# Patient Record
Sex: Female | Born: 1935 | Race: White | Hispanic: No
Health system: Southern US, Community
[De-identification: ages and names within clinical notes are randomized; demographics above are authoritative.]

## PROBLEM LIST (undated history)

## (undated) DIAGNOSIS — N200 Calculus of kidney: Secondary | ICD-10-CM

## (undated) DIAGNOSIS — I10 Essential (primary) hypertension: Secondary | ICD-10-CM

## (undated) DIAGNOSIS — M47812 Spondylosis without myelopathy or radiculopathy, cervical region: Secondary | ICD-10-CM

## (undated) DIAGNOSIS — I69320 Aphasia following cerebral infarction: Secondary | ICD-10-CM

## (undated) DIAGNOSIS — Z9289 Personal history of other medical treatment: Secondary | ICD-10-CM

## (undated) DIAGNOSIS — K5792 Diverticulitis of intestine, part unspecified, without perforation or abscess without bleeding: Secondary | ICD-10-CM

## (undated) DIAGNOSIS — M8580 Other specified disorders of bone density and structure, unspecified site: Secondary | ICD-10-CM

## (undated) DIAGNOSIS — M1990 Unspecified osteoarthritis, unspecified site: Secondary | ICD-10-CM

## (undated) DIAGNOSIS — I480 Paroxysmal atrial fibrillation: Secondary | ICD-10-CM

## (undated) DIAGNOSIS — M7989 Other specified soft tissue disorders: Secondary | ICD-10-CM

## (undated) DIAGNOSIS — N189 Chronic kidney disease, unspecified: Secondary | ICD-10-CM

## (undated) DIAGNOSIS — R269 Unspecified abnormalities of gait and mobility: Secondary | ICD-10-CM

## (undated) DIAGNOSIS — G5600 Carpal tunnel syndrome, unspecified upper limb: Secondary | ICD-10-CM

## (undated) DIAGNOSIS — M069 Rheumatoid arthritis, unspecified: Secondary | ICD-10-CM

## (undated) DIAGNOSIS — I38 Endocarditis, valve unspecified: Secondary | ICD-10-CM

## (undated) DIAGNOSIS — R918 Other nonspecific abnormal finding of lung field: Secondary | ICD-10-CM

## (undated) DIAGNOSIS — I639 Cerebral infarction, unspecified: Secondary | ICD-10-CM

## (undated) DIAGNOSIS — E559 Vitamin D deficiency, unspecified: Secondary | ICD-10-CM

## (undated) DIAGNOSIS — F419 Anxiety disorder, unspecified: Secondary | ICD-10-CM

## (undated) HISTORY — DX: Spondylosis without myelopathy or radiculopathy, cervical region: M47.812

## (undated) HISTORY — DX: Diverticulitis of intestine, part unspecified, without perforation or abscess without bleeding: K57.92

## (undated) HISTORY — DX: Chronic kidney disease, unspecified: N18.9

## (undated) HISTORY — DX: Unspecified abnormalities of gait and mobility: R26.9

## (undated) HISTORY — DX: Paroxysmal atrial fibrillation: I48.0

## (undated) HISTORY — DX: Calculus of kidney: N20.0

## (undated) HISTORY — DX: Unspecified osteoarthritis, unspecified site: M19.90

## (undated) HISTORY — DX: Aphasia following cerebral infarction: I69.320

## (undated) HISTORY — DX: Cerebral infarction, unspecified: I63.9

## (undated) HISTORY — DX: Personal history of other medical treatment: Z92.89

## (undated) HISTORY — DX: Carpal tunnel syndrome, unspecified upper limb: G56.00

## (undated) HISTORY — DX: Vitamin D deficiency, unspecified: E55.9

## (undated) HISTORY — DX: Other specified disorders of bone density and structure, unspecified site: M85.80

## (undated) HISTORY — DX: Essential (primary) hypertension: I10

## (undated) HISTORY — DX: Other nonspecific abnormal finding of lung field: R91.8

## (undated) HISTORY — PX: APPENDECTOMY: SHX54

## (undated) HISTORY — DX: Anxiety disorder, unspecified: F41.9

## (undated) HISTORY — DX: Endocarditis, valve unspecified: I38

## (undated) HISTORY — DX: Rheumatoid arthritis, unspecified: M06.9

---

## 1973-05-03 HISTORY — PX: ABDOMINAL HYSTERECTOMY: SHX81

## 1983-05-04 HISTORY — PX: NOSE SURGERY: SHX723

## 1985-05-03 HISTORY — PX: OTHER SURGICAL HISTORY: SHX169

## 1997-09-18 ENCOUNTER — Other Ambulatory Visit: Admission: RE | Admit: 1997-09-18 | Discharge: 1997-09-18 | Payer: Self-pay | Admitting: *Deleted

## 2000-01-15 ENCOUNTER — Other Ambulatory Visit: Admission: RE | Admit: 2000-01-15 | Discharge: 2000-01-15 | Payer: Self-pay | Admitting: *Deleted

## 2000-02-01 ENCOUNTER — Encounter: Admission: RE | Admit: 2000-02-01 | Discharge: 2000-02-01 | Payer: Self-pay | Admitting: Surgery

## 2000-02-01 ENCOUNTER — Encounter: Payer: Self-pay | Admitting: Surgery

## 2000-08-01 ENCOUNTER — Encounter: Payer: Self-pay | Admitting: Internal Medicine

## 2000-08-01 ENCOUNTER — Encounter: Admission: RE | Admit: 2000-08-01 | Discharge: 2000-08-01 | Payer: Self-pay | Admitting: Internal Medicine

## 2002-01-09 ENCOUNTER — Encounter: Payer: Self-pay | Admitting: Orthopedic Surgery

## 2002-01-10 ENCOUNTER — Inpatient Hospital Stay (HOSPITAL_COMMUNITY): Admission: RE | Admit: 2002-01-10 | Discharge: 2002-01-15 | Payer: Self-pay | Admitting: Orthopedic Surgery

## 2002-05-03 HISTORY — PX: REPLACEMENT TOTAL KNEE: SUR1224

## 2005-05-04 ENCOUNTER — Emergency Department (HOSPITAL_COMMUNITY): Admission: EM | Admit: 2005-05-04 | Discharge: 2005-05-04 | Payer: Self-pay | Admitting: Family Medicine

## 2005-07-01 ENCOUNTER — Ambulatory Visit: Payer: Self-pay | Admitting: Internal Medicine

## 2005-07-19 ENCOUNTER — Ambulatory Visit: Payer: Self-pay | Admitting: Internal Medicine

## 2009-01-30 ENCOUNTER — Encounter: Admission: RE | Admit: 2009-01-30 | Discharge: 2009-01-30 | Payer: Self-pay | Admitting: Internal Medicine

## 2009-03-23 ENCOUNTER — Inpatient Hospital Stay (HOSPITAL_COMMUNITY): Admission: EM | Admit: 2009-03-23 | Discharge: 2009-03-26 | Payer: Self-pay | Admitting: Emergency Medicine

## 2009-03-24 ENCOUNTER — Encounter (INDEPENDENT_AMBULATORY_CARE_PROVIDER_SITE_OTHER): Payer: Self-pay | Admitting: Neurology

## 2009-03-24 ENCOUNTER — Ambulatory Visit: Payer: Self-pay | Admitting: Vascular Surgery

## 2009-03-25 ENCOUNTER — Encounter (INDEPENDENT_AMBULATORY_CARE_PROVIDER_SITE_OTHER): Payer: Self-pay | Admitting: Neurology

## 2009-03-28 DIAGNOSIS — K649 Unspecified hemorrhoids: Secondary | ICD-10-CM | POA: Insufficient documentation

## 2009-03-28 DIAGNOSIS — K5730 Diverticulosis of large intestine without perforation or abscess without bleeding: Secondary | ICD-10-CM | POA: Insufficient documentation

## 2009-03-28 DIAGNOSIS — I10 Essential (primary) hypertension: Secondary | ICD-10-CM | POA: Insufficient documentation

## 2009-03-28 DIAGNOSIS — I4891 Unspecified atrial fibrillation: Secondary | ICD-10-CM

## 2009-03-28 DIAGNOSIS — Z87898 Personal history of other specified conditions: Secondary | ICD-10-CM | POA: Insufficient documentation

## 2009-03-28 DIAGNOSIS — M129 Arthropathy, unspecified: Secondary | ICD-10-CM | POA: Insufficient documentation

## 2009-04-02 DIAGNOSIS — I639 Cerebral infarction, unspecified: Secondary | ICD-10-CM

## 2009-04-02 HISTORY — DX: Cerebral infarction, unspecified: I63.9

## 2009-04-04 ENCOUNTER — Encounter: Admission: RE | Admit: 2009-04-04 | Discharge: 2009-05-02 | Payer: Self-pay | Admitting: Nurse Practitioner

## 2009-04-13 ENCOUNTER — Inpatient Hospital Stay (HOSPITAL_COMMUNITY): Admission: EM | Admit: 2009-04-13 | Discharge: 2009-04-14 | Payer: Self-pay | Admitting: Emergency Medicine

## 2009-05-07 ENCOUNTER — Encounter: Admission: RE | Admit: 2009-05-07 | Discharge: 2009-06-27 | Payer: Self-pay | Admitting: Nurse Practitioner

## 2009-06-03 DIAGNOSIS — Z9289 Personal history of other medical treatment: Secondary | ICD-10-CM

## 2009-06-03 HISTORY — DX: Personal history of other medical treatment: Z92.89

## 2009-06-25 ENCOUNTER — Encounter: Payer: Self-pay | Admitting: Internal Medicine

## 2009-08-13 ENCOUNTER — Ambulatory Visit: Payer: Self-pay | Admitting: Internal Medicine

## 2009-08-13 DIAGNOSIS — R059 Cough, unspecified: Secondary | ICD-10-CM | POA: Insufficient documentation

## 2009-08-13 DIAGNOSIS — R05 Cough: Secondary | ICD-10-CM

## 2009-08-14 ENCOUNTER — Encounter: Payer: Self-pay | Admitting: Internal Medicine

## 2009-08-14 LAB — CONVERTED CEMR LAB
Pro B Natriuretic peptide (BNP): 286 pg/mL — ABNORMAL HIGH (ref 0.0–100.0)
Sed Rate: 18 mm/hr (ref 0–22)

## 2009-09-04 ENCOUNTER — Ambulatory Visit: Payer: Self-pay | Admitting: Internal Medicine

## 2009-09-05 ENCOUNTER — Telehealth: Payer: Self-pay | Admitting: Internal Medicine

## 2009-09-05 ENCOUNTER — Ambulatory Visit: Payer: Self-pay | Admitting: Internal Medicine

## 2009-09-24 ENCOUNTER — Encounter: Admission: RE | Admit: 2009-09-24 | Discharge: 2009-09-24 | Payer: Self-pay | Admitting: Internal Medicine

## 2010-01-12 ENCOUNTER — Encounter: Admission: RE | Admit: 2010-01-12 | Discharge: 2010-01-12 | Payer: Self-pay | Admitting: Internal Medicine

## 2010-01-14 ENCOUNTER — Encounter: Admission: RE | Admit: 2010-01-14 | Discharge: 2010-01-14 | Payer: Self-pay | Admitting: Internal Medicine

## 2010-03-03 ENCOUNTER — Encounter: Payer: Self-pay | Admitting: Internal Medicine

## 2010-06-04 NOTE — Assessment & Plan Note (Signed)
Summary: Pulmonary/ eval cough - change to bystolic/avarpo    Visit Type:  Initial Consult Copy to:  Dr. Deland Pretty Primary Provider/Referring Provider:  Dr. Deland Pretty  CC:  Cough.  History of Present Illness: 32 yowf never smoker with cough since around 2005   August 13, 2009 cc cough x 6 years  present daily worse when lie down but does not wake her up but notices as soon as gets up to go the bathroom maybe a tsp or up to tbsp white. seems worse with cold weather assoc with nasal congestion. no response to prednisone.  no purulent sputum. Pt denies any significant sore throat, dysphagia, itching, sneezing,  nasal congestion or excess secretions,  fever, chills, sweats, unintended wt loss, pleuritic or exertional cp, hempoptysis, change in activity tolerance  orthopnea pnd or leg swelling Pt also denies any obvious fluctuation in symptoms with weather or environmental change or other alleviating or aggravating factors.       Current Medications (verified): 1)  Warfarin Sodium 2 Mg Tabs (Warfarin Sodium) .Marland Kitchen.. 1 Once Daily 2)  Atenolol 50 Mg Tabs (Atenolol) .Marland Kitchen.. 1 1/2 Once Daily 3)  Diltiazem Hcl Cr 180 Mg Xr24h-Cap (Diltiazem Hcl) .Marland Kitchen.. 1 Once Daily 4)  Amiodarone Hcl 200 Mg Tabs (Amiodarone Hcl) .Marland Kitchen.. 1 Once Daily 5)  Losartan Potassium 100 Mg Tabs (Losartan Potassium) .Marland Kitchen.. 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  1. Nasal  surgery for a deviated septum in 1985.  2. Intestinal surgery in 1983.  3. Hysterectomy in 1975.  4. Appendectomy in 1940. Knee Arthroplasty-Total.. Dione Plover. Aluisio, M.D...01/10/2002  Family History:  Positive for hypertension and emphysema and negative   for stroke.     TB- Father Lymphoma- Mother  Social History: The patient is married and lives with her spouse.  She   has a caretaker for her spouse as well (he has dementia).  There is no  Never smoker No ETOH  Review of Systems       The patient complains of productive cough, irregular heartbeats, headaches, nasal congestion/difficulty breathing through nose, sneezing, hand/feet swelling, and joint stiffness or pain.  The patient denies shortness of breath with activity, shortness of breath at rest, non-productive cough, coughing up blood, chest pain, acid heartburn, indigestion, loss of appetite, weight change, abdominal pain, difficulty swallowing, sore throat, tooth/dental problems, itching, ear ache, anxiety, depression, rash, change in color of mucus, and fever.    Vital Signs:  Patient profile:   77 year old female Height:      60 inches Weight:      122 pounds O2 Sat:      94 % on Room air Temp:     98.4 degrees F oral Pulse rate:   77 / minute BP sitting:   140 / 74  (left arm)  Vitals Entered By: Tilden Dome (August 13, 2009 1:57 PM)  O2 Flow:  Room air  Physical Exam  Additional Exam:  thin amb wf who failed to answer a single question asked  in a straightforward manner, tending to go off on tangents or answer questions with ambiguous medical terms or diagnoses and seemed perplexed  when asked the same question more than once for clarification.  wt 122 August 13, 2009 HEENT: nl dentition, turbinates, and orophanx. Nl external ear canals without cough reflex NECK :  without JVD/Nodes/TM/ nl carotid upstrokes bilaterally LUNGS: no acc muscle use, clear to A and P bilaterally without cough on insp or exp  maneuvers CV:  RRR  no s3 or murmur or increase in P2, no edema  ABD:  soft and nontender with nl excursion in the supine position. No bruits or organomegaly, bowel sounds nl MS:  warm without deformities, calf tenderness, cyanosis or clubbing SKIN: warm and dry without lesions   NEURO:  alert, approp, no deficits     Sed Rate                  18 mm/hr                    0-22  Tests: (2) B-Type Natiuretic Peptide (BNPR)  B-Type Natriuetic Peptide                        [H]  286.0 pg/mL                 0.0-100.0  CXR  Procedure date:  06/25/2009  Findings:      decreased chronic appearing changes in Right apex with no active dz  Impression & Recommendations:  Problem # 1:  COUGH (ICD-786.2) The most common causes of chronic cough in immunocompetent adults include: upper airway cough syndrome (UACS), previously referred to as postnasal drip syndrome,  caused by variety of rhinosinus conditions; (2) asthma; (3) GERD; (4) chronic bronchitis from cigarette smoking or other inhaled environmental irritants; (5) nonasthmatic eosinophilic bronchitis; and (6) bronchiectasis. These conditions, singly or in combination, have accounted for up to 94% of the causes of chronic cough in prospective studies.   most likely this is Upper airway cough syndrome, so named because it's frequently impossible to sort out how much is L CR/sinusitis with freq throat clearing  (vs primary GERD)  generating secondary extra esophageal GERD from wide swings in gastric pressure that occur with throat clearing, promoting self use of mint and menthol lozenges that reduce the lower esophageal sphincter tone and exacerbate the problem further.  These symptoms are easily confused with asthma/copd by even experienced pulmonogists because they overlap so much. These are the same pts who not infrequently have failed to tolerate ace inhibitors,  dry powder inhalers or biphosphonates or report having reflux symptoms that don't  respond to standard doses of PPI  For now max rx gerd rx and change cozaar to avapro based purely on anecdotal reports of cough from this particular arb.  Could be cough variant asthma so try off atenonol and on  bystolic, the most beta -1  selective Beta blocker available in sample form, with bisoprolol the most selective generic choice  on the market.   Problem # 2:  HYPERTENSION (ICD-401.9)  The following medications were removed from the medication list:    Amlodipine Besylate 10 Mg Tabs (Amlodipine besylate) .Marland Kitchen... 1 tab once daily    Atenolol 50 Mg Tabs (Atenolol) .Marland Kitchen... 1 1/2 once daily    Benicar 40 Mg Tabs (Olmesartan medoxomil) .Marland Kitchen... 1 tab once daily    Ziac 5-6.25 Mg Tabs (Bisoprolol-hydrochlorothiazide) .Marland Kitchen... 1 tab once daily  Losartan Potassium 100 Mg Tabs (Losartan potassium) .Marland Kitchen... 1 once daily Her updated medication list for this problem includes:    Diltiazem Hcl Cr 180 Mg Xr24h-cap (Diltiazem hcl) .Marland Kitchen... 1 once daily    Bystolic 5 Mg Tabs (Nebivolol hcl) ..... One tablet daily    Avapro 300 Mg Tabs (Irbesartan) ..... One tablet by mouth daily  Medications Added to Medication List This Visit: 1)  Warfarin Sodium 2 Mg Tabs (Warfarin sodium) .Marland Kitchen.. 1 once daily 2)  Atenolol 50 Mg Tabs (Atenolol) .Marland Kitchen.. 1 1/2 once daily 3)  Diltiazem Hcl Cr 180 Mg Xr24h-cap (Diltiazem hcl) .Marland Kitchen.. 1 once daily 4)  Amiodarone Hcl 200 Mg Tabs (Amiodarone hcl) .Marland Kitchen.. 1 once daily 5)  Losartan Potassium 100 Mg Tabs (Losartan potassium) .Marland Kitchen.. 1 once daily 6)  Bystolic 5 Mg Tabs (Nebivolol hcl) .... One tablet daily 7)  Avapro 300 Mg Tabs (Irbesartan) .... One tablet by mouth daily  Other Orders: T-Allergy Profile [place], [place], [place], [place], [place] [id number]) New Patient Level V [phone]) TLB-Sedimentation Rate (ESR) (85652-ESR) TLB-BNP (B-Natriuretic Peptide) (83880-BNPR)  Patient Instructions: 1)  Stop atenolol and start bystolic 5 mg daily 2)  Stop cozaar and start avapro 300 mg daily 3)  Start Prilosec otc  20mg  Take  one 30-60 min before first meal of the day and add pepcid 20 mg one at bedtime 4)  Try deslym otc  for cough 5)  GERD (REFLUX)  is a common cause of respiratory symptoms. It commonly presents without heartburn and can be treated with medication, but also with lifestyle changes including avoidance of late meals, excessive alcohol, smoking cessation, and avoid fatty foods, chocolate, peppermint, colas, red wine, and acidic juices such as orange juice. NO MINT OR MENTHOL PRODUCTS SO NO COUGH DROPS  6)  USE SUGARLESS CANDY INSTEAD (jolley ranchers)  7)  NO OIL BASED VITAMINS  8)  Please schedule a follow-up appointment in 2 weeks, sooner if needed and bring all active medications with you

## 2010-06-04 NOTE — Assessment & Plan Note (Signed)
Summary: Pulmonary/ ext summary f/u ov try 1st gen H1   Copy to:  Dr. Deland Pretty Primary Provider/Referring Provider:  Dr. Deland Pretty  CC:  2 wk followup.  Pt states that her cough is about 50 % improved.  She denies any new complaints today.Marland Kitchen  History of Present Illness: 70 yowf never smoker with cough since around 2005   August 13, 2009 cc cough x 6 years  present daily worse when lie down but does not wake her up but notices as soon as gets up to go the bathroom maybe a tsp or up to tbsp white. seems worse with cold weather assoc with nasal congestion. no response to prednisone.  no purulent sputum. Stop atenolol and start bystolic 5 mg daily Stop cozaar and start avapro 300 mg daily Start Prilosec otc 20mg  Take  one 30-60 min before first meal of the day and add pepcid 20 mg one at bedtime Try deslym otc  for cough Check allergy profile > completely neg  Sep 04, 2009 cc cough is about 50 % improved.  She denies any new complaints today. Main issue is drainage sensation down throat on exp to different non-specific environmental changes. Pt denies any significant sore throat, dysphagia, itching, sneezing,  nasal congestion or excess or purulent secretions,  fever, chills, sweats, unintended wt loss, pleuritic or exertional cp, hempoptysis, change in activity tolerance  orthopnea pnd or leg swelling. Pt also denies any obvious fluctuation in symptoms with weather or environmental change or other alleviating or aggravating factors.       Current Medications (verified): 1)  Warfarin Sodium 2 Mg Tabs (Warfarin Sodium) .Marland Kitchen.. 1 Once Daily 2)  Diltiazem Hcl Cr 180 Mg Xr24h-Cap (Diltiazem Hcl) .Marland Kitchen.. 1 Once Daily 3)  Amiodarone Hcl 200 Mg Tabs (Amiodarone Hcl) .Marland Kitchen.. 1 Once Daily 4)  Bystolic 5 Mg  Tabs (Nebivolol Hcl) .... One Tablet Daily 5)  Avapro 300 Mg  Tabs (Irbesartan) .... One Tablet By Mouth Daily 6)  Multivitamins  Tabs (Multiple Vitamin) .Marland Kitchen.. 1 Once Daily 7)  Calcium-Vitamin D  600-200 Mg-Unit Tabs (Calcium-Vitamin D) .Marland Kitchen.. 1 Two Times A Day 8)  Osteo Bi-Flex Adv Joint Shield  Tabs (Misc Natural Products) .Marland Kitchen.. 1 Once Daily  Allergies (verified): No Known Drug Allergies  Past History:  Past Medical History: CEREBRAL INFARCTION/ SECONDARY TO ATRIAL FIBRILLATION (ICD-434.91) ATRIAL FIBRILLATION (ICD-427.31) HYPERTENSION (ICD-401.9) ARTHRITIS (ICD-716.90) MIGRAINES, HX OF (ICD-V13.8) DIVERTICULAR DISEASE (ICD-562.10) HEMORRHOIDS (ICD-455.6) Cough onset around 2005    - Sinus CT 07/01/2005 no active sinus dz, small retention cyst left max sinus    - Chest CT 01/30/09  Linear scarring rul,  4 mm noncalcified nodule RLL     - Allergy profile neg 08/14/2009  Vital Signs:  Patient profile:   77 year old female Weight:      118 pounds BMI:     23.13 O2 Sat:      96 % on Room air Temp:     97.9 degrees F oral Pulse rate:   72 / minute BP sitting:   124 / 80  (left arm)  Vitals Entered By: Tilden Dome (Sep 04, 2009 10:58 AM)  O2 Flow:  Room air  Physical Exam  Additional Exam:  thin amb wf who failed to answer a single question asked in a straightforward manner, tending to go off on tangents or answer questions with ambiguous medical terms or diagnoses and seemed perplexed  when asked the same question more than once for clarification.  wt 122 August 13, 2009> 118 Sep 04, 2009  HEENT: nl dentition, turbinates, and orophanx. Nl external ear canals without cough reflex NECK :  without JVD/Nodes/TM/ nl carotid upstrokes bilaterally LUNGS: no acc muscle use, clear to A and P bilaterally without cough on insp or exp maneuvers CV:  RRR  no s3 or murmur or increase in P2, no edema  ABD:  soft and nontender with nl excursion in the supine position. No bruits or organomegaly, bowel sounds nl MS:  warm without deformities, calf tenderness, cyanosis or clubbing    Impression & Recommendations:  Problem # 1:  COUGH (ICD-786.2) Classic  Classic Upper airway cough  syndrome, so named because it's frequently impossible to sort out how much is  CR/sinusitis with freq throat clearing (which can be related to primary GERD)   vs  causing  secondary extra esophageal GERD from wide swings in gastric pressure that occur with throat clearing, promoting self use of mint and menthol lozenges that reduce the lower esophageal sphincter tone and exacerbate the problem further These are the same pts who not infrequently have failed to tolerate ace inhibitors,  dry powder inhalers or biphosphonates or report having reflux symptoms that don't respond to standard doses of PPI  For now add 1st gen antihistamines and r/o sinusitis with sinus ct - no evidence whatsoever this is an allergy, though she seemed unshakable on this issue and has already seen an allergist who apparently didn't feel it was allergic either  The standardized cough guidelines recently published in Chest are a 14 step process, not a single office visit,  and are intended  to address this problem logically,  with an alogrithm dependent on response to each progressive step  to determine a specific diagnosis with  minimal addtional testing needed. Therefore if compliance is an issue this empiric standardized approach simply won't work.   Problem # 2:  HYPERTENSION (ICD-401.9)  Her updated medication list for this problem includes:    Diltiazem Hcl Cr 180 Mg Xr24h-cap (Diltiazem hcl) .Marland Kitchen... 1 once daily    Bystolic 5 Mg Tabs (Nebivolol hcl) ..... One tablet daily    Avapro 300 Mg Tabs (Irbesartan) ..... One tablet by mouth daily  She's not convinced this made any difference in cough so ok to rechallenge  Each maintenance medication was reviewed in detail including most importantly the difference between maintenance prns and under what circumstances the prns are to be used.  In addition, these two groups (for which the patient should keep up with refills) were distinguished from a third group :  meds that are used  only short term with the intent to complete a course of therapy and then not refill them.  The med list was then fully reconciled and reorganized to reflect this important distinction.   Medications Added to Medication List This Visit: 1)  Multivitamins Tabs (Multiple vitamin) .Marland Kitchen.. 1 once daily 2)  Calcium-vitamin D 600-200 Mg-unit Tabs (Calcium-vitamin d) .Marland Kitchen.. 1 two times a day 3)  Osteo Bi-flex Adv Joint Shield Tabs (Misc natural products) .Marland Kitchen.. 1 once daily 4)  Chlor-trimeton 4 Mg Tabs (Chlorpheniramine maleate) .... One every 6 hours for drainage as needed  Other Orders: Misc. Referral (Misc. Ref) Est. Patient Level IV [id number])  Patient Instructions: 1)  Continue Prilosec 20 mg Take  one 30-60 min before first meal of the day and pepcid 20 mg at bedtime 2)  ok to resume your previous blood pressure pills so see if cough gets  worse. 3)  if cough is worse it's ok to take delsym up 2 tsp twice years 4)  if drainage I recommend chortrimeton 4 mg one every 6 hours 5)  GERD (REFLUX)  is a common cause of respiratory symptoms. It commonly presents without heartburn and can be treated with medication, but also with lifestyle changes including avoidance of late meals, excessive alcohol, smoking cessation, and avoid fatty foods, chocolate, peppermint, colas, red wine, and acidic juices such as orange juice. NO MINT OR MENTHOL PRODUCTS SO NO COUGH DROPS  6)  USE SUGARLESS CANDY INSTEAD (jolley ranchers)  7)  NO OIL BASED VITAMINS  8)  discussed with pt:  Unlike when you get a prescription for eyeglasses, it's not possible to always walk out of this or any medical office with a perfect prescription that is immediately effective  based on any test that we offer here.  On the contrary, it may take several weeks for the full impact of changes recommened today - hopefully you will respond well.  If not, then we'll adjust your medication on your next visit accordingly, knowing more then than we can possibly  know now.

## 2010-06-04 NOTE — Letter (Signed)
Summary: [facility]  [facility]   Imported By: Phillis Knack 08/26/2009 11:39:58  _____________________________________________________________________  External Attachment:    Type:   Image     Comment:   External Document

## 2010-06-04 NOTE — Progress Notes (Signed)
Summary: abnormal finding- call rose at radiology asap  Phone Note From Other Clinic   Caller: rose from [place]- radiology Call For: Tedford Berg nurse Summary of Call: please call rose re: abnormal finding on CT that a dr wants to bring to dr werts attn. rose says it's ok for triage to call her back at x [phone] (i couldn't get nurse on phone at the moment)  Initial call taken by: Cooper Render, CNA,  Sep 05, 2009 3:51 PM  Follow-up for Phone Call        spoke with rose, she faxed CT report, looks like Dr. Melvyn Novas has already appened CT report with results. will give ct report to [place]. McVille Bing CMA  Sep 05, 2009 4:00 PM

## 2010-06-04 NOTE — Letter (Signed)
Summary: [facility]   Imported By: Bubba Hales 03/18/2010 09:45:12  _____________________________________________________________________  External Attachment:    Type:   Image     Comment:   External Document

## 2010-08-04 LAB — BASIC METABOLIC PANEL
Calcium: 8.7 mg/dL (ref 8.4–10.5)
Calcium: 9.4 mg/dL (ref 8.4–10.5)
Chloride: 102 mEq/L (ref 96–112)
Creatinine, Ser: 0.99 mg/dL (ref 0.4–1.2)
GFR calc Af Amer: >60 mL/min (ref >60)
GFR calc Af Amer: >60 mL/min (ref >60)
GFR calc non Af Amer: 55 mL/min — ABNORMAL LOW (ref >60)
Sodium: 135 mEq/L (ref 135–145)
Sodium: 138 mEq/L (ref 135–145)

## 2010-08-04 LAB — URINALYSIS, ROUTINE W REFLEX MICROSCOPIC
Glucose, UA: NEGATIVE mg/dL
Protein, ur: 100 mg/dL — AB
Protein, ur: NEGATIVE mg/dL
Specific Gravity, Urine: 1.02 (ref 1.005–1.030)
Urobilinogen, UA: 0.2 mg/dL (ref 0.0–1.0)

## 2010-08-04 LAB — CBC
HCT: 42.3 % (ref 36.0–46.0)
Hemoglobin: 12.7 g/dL (ref 12.0–15.0)
Hemoglobin: 13.6 g/dL (ref 12.0–15.0)
Hemoglobin: 14.2 g/dL (ref 12.0–15.0)
MCV: 96.3 fL (ref 78.0–100.0)
Platelets: 206 10*3/uL (ref 150–400)
RBC: 3.82 MIL/uL — ABNORMAL LOW (ref 3.87–5.11)
RBC: 4.11 MIL/uL (ref 3.87–5.11)
RBC: 4.4 MIL/uL (ref 3.87–5.11)
RDW: 13.4 % (ref 11.5–15.5)
WBC: 7.4 10*3/uL (ref 4.0–10.5)
WBC: 8.8 10*3/uL (ref 4.0–10.5)

## 2010-08-04 LAB — URINE MICROSCOPIC-ADD ON

## 2010-08-04 LAB — URINE CULTURE

## 2010-08-04 LAB — PROTIME-INR
INR: 2.67 — ABNORMAL HIGH (ref 0.00–1.49)
INR: 4.52 — ABNORMAL HIGH (ref 0.00–1.49)
INR: >10 (ref 0.00–1.49)
Prothrombin Time: 28.2 seconds — ABNORMAL HIGH (ref 11.6–15.2)
Prothrombin Time: 78.7 seconds — ABNORMAL HIGH (ref 11.6–15.2)
Prothrombin Time: 80.2 seconds — ABNORMAL HIGH (ref 11.6–15.2)

## 2010-08-04 LAB — APTT: aPTT: 85 seconds — ABNORMAL HIGH (ref 24–37)

## 2010-08-05 LAB — URINALYSIS, ROUTINE W REFLEX MICROSCOPIC
Bilirubin Urine: NEGATIVE
Nitrite: NEGATIVE
Specific Gravity, Urine: 1.014 (ref 1.005–1.030)
pH: 6 (ref 5.0–8.0)

## 2010-08-05 LAB — CBC
HCT: 38.4 % (ref 36.0–46.0)
MCHC: 34.9 g/dL (ref 30.0–36.0)
MCV: 96.5 fL (ref 78.0–100.0)
Platelets: 207 10*3/uL (ref 150–400)
RDW: 14.3 % (ref 11.5–15.5)

## 2010-08-05 LAB — LIPID PANEL
Cholesterol: 158 mg/dL (ref 0–200)
HDL: 60 mg/dL (ref >39)
LDL Cholesterol: 86 mg/dL (ref 0–99)
Total CHOL/HDL Ratio: 2.6 RATIO

## 2010-08-05 LAB — COMPREHENSIVE METABOLIC PANEL
Albumin: 4.4 g/dL (ref 3.5–5.2)
BUN: 41 mg/dL — ABNORMAL HIGH (ref 6–23)
Calcium: 9.6 mg/dL (ref 8.4–10.5)
Chloride: 109 mEq/L (ref 96–112)
Creatinine, Ser: 1.44 mg/dL — ABNORMAL HIGH (ref 0.4–1.2)
Total Bilirubin: 1.1 mg/dL (ref 0.3–1.2)

## 2010-08-05 LAB — DIFFERENTIAL
Basophils Absolute: 0 10*3/uL (ref 0.0–0.1)
Lymphocytes Relative: 22 % (ref 12–46)
Lymphs Abs: 1.6 10*3/uL (ref 0.7–4.0)
Monocytes Absolute: 0.5 10*3/uL (ref 0.1–1.0)
Neutro Abs: 4.8 10*3/uL (ref 1.7–7.7)

## 2010-08-05 LAB — GLUCOSE, CAPILLARY
Glucose-Capillary: 113 mg/dL — ABNORMAL HIGH (ref 70–99)
Glucose-Capillary: 92 mg/dL (ref 70–99)
Glucose-Capillary: 92 mg/dL (ref 70–99)

## 2010-08-05 LAB — CARDIAC PANEL(CRET KIN+CKTOT+MB+TROPI)
Relative Index: INVALID (ref 0.0–2.5)
Total CK: 99 U/L (ref 7–177)

## 2010-09-18 NOTE — H&P (Signed)
NAMEJERSIE, TOSTO                        ACCOUNT NO.:  [id number]   MEDICAL RECORD NO.:  [id number]                   PATIENT TYPE:   LOCATION:                                       FACILITY:   PHYSICIAN:  [id number]                                DATE OF BIRTH:   DATE OF ADMISSION:  01/10/2002  DATE OF DISCHARGE:                                HISTORY & PHYSICAL   CHIEF COMPLAINT:  Right knee pain.   HISTORY OF PRESENT ILLNESS:  The patient is a 77 year old female who was  seen for evaluation by Dr. Gaynelle Arabian for ongoing right knee pain.  She  has been treated conservatively for her knee pain in the past utilizing  medications, and also has undergone a series of Synvisc injections back in  May, which she did not get much relief from.  The patient was seen in  consultation for ongoing knee pain that has started to curtail her  activities, as well as her daily living.  She has reached a stage where she  feels like she would like to proceed with total knee replacement.  Risks and  benefits have been discussed, and she has elected to proceed with surgery.   ALLERGIES:  No known drug allergies.   CURRENT MEDICATIONS:  1. Diclofenac 50 mg p.o. b.i.d., stopped a few days prior to surgery.  2. Ziac 5/6.25 mg one p.o. q.a.m.  3. Micardis 80/12.5 mg one p.o. q.a.m.   PAST MEDICAL HISTORY:  1. History of migraines.  2. Hypertension.  3. Diverticulosis.  4. Hemorrhoids.  5. History of ankle fracture.  6. Osteoarthritis.   PAST SURGICAL HISTORY:  1. Nasal surgery for a deviated septum in 1985.  2. Intestinal surgery in 1983.  3. Hysterectomy in 1975.  4. Appendectomy in 1940.   SOCIAL HISTORY:  She is married.  Denies the use of tobacco products or  alcohol products.  Her husband will be assisting her with care following  surgery.  She has four children.  Lives in a single story home with no  stairs.   FAMILY HISTORY:  Mother deceased at age 49 with a history of  lymphoma.  Father died at age 52 of a history of TB.   REVIEW OF SYMPTOMS:  GENERAL:  No fever, chills, night sweats.  NEUROLOGIC:  No seizures, syncope, or paralysis.  She does have a history of migraines.  RESPIRATORY:  No shortness of breath, productive cough, or hemoptysis.  CARDIOVASCULAR:  No chest pain, angina, or orthopnea.  GASTROINTESTINAL:  She does have diverticulosis and hemorrhoids, no nausea, vomiting, diarrhea,  or constipation.  No blood or mucus in the stool at this time.  GENITOURINARY:  She does have some urinary frequency.  No dysuria,  hematuria, or discharge.  MUSCULOSKELETAL:  Pertinent to the right knee  found in the  history of present illness.   PHYSICAL EXAMINATION:  VITAL SIGNS:  Pulse 64, respirations 16, blood  pressure 138/88.  GENERAL:  The patient is a 77 year old female, well-developed, well-  nourished, appears to be in no acute distress.  She is alert, oriented, and  cooperative.  Very pleasant.  HEENT:  Normocephalic, atraumatic.  Pupils are round and reactive.  Oropharynx clear.  NECK:  Supple.  No carotid bruits are appreciated.  CHEST:  Clear to auscultation anterior and posterior chest walls, no  rhonchi, rales, or other adventitious sounds appreciated.  HEART:  Regular rate and rhythm.  She has a grade 2 to 3/6 systolic ejection  murmur, S1 and S2 noted.  ABDOMEN:  Soft, nontender, bowel sounds are present.  RECTAL:  Not done, not pertinent to present illness.  BREASTS:  Not done, not pertinent to present illness.  GENITALIA:  Not done, not pertinent to present illness.  EXTREMITIES:  To the right lower extremity:  She is noted to have a  significant valgus deformity of approximately 20 degrees while standing.  Range of motion is 5 to 105 degrees.  There is no instability about the  joint.   IMPRESSION:  1. Osteoarthritis, right knee.  2. Hypertension.  3. History of migraines.  4. Diverticulosis.  5. Hemorrhoids.   PLAN:  The patient  is being admitted to [facility] to undergo a  right total knee replacement arthroplasty.  Risks and benefits of the  procedure have been discussed with the patient, and she has elected to  proceed with surgery.  The patient's physician is Dr. Jeanmarie Hubert.  He will  be notified of the room number on admission, and will be consulted if needed  for any medical assistance with this patient throughout the hospital course.      Alexzandrew L. [place], [place]    ALP/MEDQ  D:  01/10/2002  T:  01/11/2002  Job:  [id number]

## 2010-09-18 NOTE — Op Note (Signed)
Madison Berg, Madison Berg                     ACCOUNT NO.:  [id number]   MEDICAL RECORD NO.:  [id number]                   PATIENT TYPE:  INP   LOCATION:  [place]                                 FACILITY:  [facility]   PHYSICIAN:  Dione Plover. Aluisio, M.D.              DATE OF BIRTH:  1924/03/07   DATE OF PROCEDURE:  01/10/2002  DATE OF DISCHARGE:                                 OPERATIVE REPORT   PREOPERATIVE DIAGNOSIS:  Osteophytes, right knee.   POSTOPERATIVE DIAGNOSIS:  Osteophytes, right knee.   PROCEDURE:  Right total knee arthroplasty.   SURGEON:  Dione Plover. Aluisio, M.D.   ASSISTANT:  Alexzandrew L. Dara Lords, PA   ANESTHESIA:  General.   ESTIMATED BLOOD LOSS:  Minimal.   DRAINS:  Hemovac x1.   COMPLICATIONS:  None.   TOURNIQUET TIME:  48 min at 350 mmHg.   DISPOSITION:  Patient stable to recovery.   BRIEF CLINICAL NOTE:  The patient is a 77 year old female with severe  osteophytes of the right knee, with significant valgus deformity; with pain  refractory to nonoperative management.  She is now for right total knee  arthroplasty.   PROCEDURE IN DETAIL:  After successful administration of general anesthesia,  the tourniquet was placed around the right thigh and the right upper  extremity prepped and draped in the usual sterile fashion.  The tourniquet  was wrapped in Esmarch, the knee flexed and the tourniquet deflated to 350  mmHg.  A standard midline incision was made with the 10 blade through  subcutaneous tissue, to the level of the extensor mechanisms.  Given her  significant valgus deformity, leaving the lateral parapatellar arthrotomy.  Soft tissue over the proximal and lateral tibia subperiosteal elevator to  the joint line with the knife and peeled off the proximal lateral tibia.  The soft tissue of the proximal and medial tibia is left intact.  We then  immediately everted her patella and flexed the knee 90 degrees.  The patella  tendon insertion was completely  normal.  The ACL and PCL were then removed.  Drills were used for creating a starting hole in the distal femur and the  canal irrigated.  A 5-degree right valgus alignment guide is placed.  The  block is then pinned, removing 9 mm off the distal femur.  Distal femur  resection is made with an oscillating saw.  Sizing block is placed and a  size 3 is most appropriate.  We utilized the epicondylar axis for rotation.  We did not use the posterior condyles because that would have internally  rotated the prosthesis plus the posterolateral deficiency.  Using the  epicondylar axis we then pinned the AP block and entered the anterior and  posterior cuts.   The tibia was subluxed forward of the menisci removed.  The extramedullary  tibial alignment guide was placed, representing proximal length of the  medial aspect of the tibial tubercle and distally along  the second  metatarsal axis of the tibial crest.  The block is then pinned to remove 10  mm off the nondeficient side.  The tibia resection is made with an  oscillating saw.   The size 3 tibial component is most effective in covering the cut bone  surface.  We then placed a size 3 mogul bearing tibial component there, and  prepared the proximal tibia with a modular drill and keel punch.  The  chamfer and intercondylar blocks were placed on the distal femur, and those  cuts made.  A size 3 posterior stabilized femoral trial; size 3 mobile  bearing tibial trial, with a 10 mm posterior stabilizer rotating platform  insert trial were placed.  Full extension is achieved, with excellent varus  and valgus balance throughout -- down past 120 degrees of flexion.  The  valgus deformity was corrected back to a more normal anatomic axis.  The  patella was then everted medially, taking the spacer to be  22 mm freehand resection, taken at 12 mm.  A 38 template placed.  Lug hole  was drilled.  Trial patella placed and it tracks normally.  The osteophytes   were then removed off the posterior femur, with the femoral trial in place.  All trials were then removed, and the coupling surfaces repaired with  pulsatile lavage.  Cement was mixed and once ready for implantation, the  size 3 posterior stabilized femur, size 3 mobile bearing tibial tray and 38  patella are cemented into place and patella held with a clamp.  A 10 mm  trial insert was placed; the knee held in full extension, all extruding  cement removed.  Once the cement had fully hardened and the permanent 10 mm  posterior stabilizer routine platform insert was placed into the tibial  tray.  The knee is reduced and there is excellent stability throughout.  The  wound was copiously irrigated with antibiotic solution and the tourniquet  released -- total time of 48 min.  Minor bleeding stopped with cautery.  Arthrotomy was closed with a Hemovac drain, leaving open a lateral release  from the superior to inferior hole of the patella.  Flexion is established  at 140 degrees, with completely normal tracking.  Subcutaneous was closed  with interrupted 2-0 Vicryl, subcuticular running 4-0 Monocryl.  Incision  was clean and dry.  Steri-Strips and a bulky sterile dressing were applied.  The patient was then awakened and transported to the recovery in stable  condition.                                               Dione Plover Aluisio, M.D.    FVA/MEDQ  D:  01/10/2002  T:  01/10/2002  Job:  [id number]

## 2010-09-18 NOTE — Discharge Summary (Signed)
Madison Berg, Madison Berg NO.:  [id number]   MEDICAL RECORD NO.:  [id number]                   PATIENT TYPE:   LOCATION:                                       FACILITY:   PHYSICIAN:  Gaynelle Arabian, MD                   DATE OF BIRTH:   DATE OF ADMISSION:  01/10/2002  DATE OF DISCHARGE:  01/15/2002                                 DISCHARGE SUMMARY   ADMISSION DIAGNOSIS:  1. Osteoarthritis right knee.  2. Hypertension.  3. History of migraines.  4. Diverticulosis.  5. Hemorrhoids.   DISCHARGE DIAGNOSIS:  1. Osteoarthritis right knee, status post right total knee replacement     arthroplasty.  2. Postoperative mild blood loss anemia.  3. Postoperative hypokalemia, improved.  4. Postoperative hyponatremia, improved.  5. Hypertension.  6. History of migraines.  7. Diverticulosis.  8. Hemorrhoids.   PROCEDURE:  The patient was taken to the OR on January 10, 2002 and  underwent a right total knee replacement arthroplasty.   SURGEON:  Gaynelle Arabian, M.D.   ASSISTANT:  Mickel Crow, P.A.C.   ANESTHESIA:  General.   DRAINS:  Hemovac times one.   TOURNIQUET TIME:  48 minutes at 350 mmHg.   CONSULTATIONS:  None.   HISTORY OF PRESENT ILLNESS:  The patient is a 77 year old female evaluated  by Madison Berg for ongoing knee pain. She has been treated conservatively in  the past about utilizing medications. She has also undergone a series of  injections back in May which did not give any relief. Knee pain has been  ongoing for some time now. She felt that she would benefit from undergoing  total knee replacement. Risks and benefits discussed and she is subsequently  admitted to [facility]:  CBC on admission revealed hemoglobin and hematocrit 13.0  and 37.6, white cell count 6 and red blood cells count 4.07. Postoperative  hemoglobin and hematocrit 10.6 and 30.0. Follow-up hemoglobin and hematocrit  9.9 and 28.6. Last noted hemoglobin and hematocrit 9.9 and 28.3.  Differential on the admission CBC all within normal limits. PT and PTT on  admission were 13.1 and 29 respectively with INR of 0.9. Serial Protimes  followed Coumadin protocol. Last noted PT and INR 23.2 and 2.4 respectively.  Chemistry on admission revealed mildly elevated BUN of 27. Remaining  chemistries were all within normal limits. Postoperative B-met  revealed  sodium dropped to 129. Fluids were adjusted and sodium was back up to 139.  Potassium dropped postoperative down from 4.6 to 3.2. Placed on potassium  supplements and back up to 3.8. BUN came down from 27 to a normal level of  20. UA on admission negative. Blood type O+.   DIAGNOSTIC STUDIES:  Preoperative  chest x-ray dated January 09, 2002  revealed probable scarring in apices, chronic obstructive pulmonary disease.  EKG dated January 10, 2002 revealed normal  sinus rhythm, incomplete bundle  branch block. This is an unconfirmed EKG.   HOSPITAL COURSE:  The patient was admitted to [facility],  taken to the OR and underwent the above said procedure without  complications. The patient tolerated the procedure well and was taken to the  recovery room and to the orthopedic flood on continued postoperative care.  Vital signs were followed. The patient was placed on PCA analgesics for pain  control following surgery. Given 24 hours of postoperative IV antibiotics.  The patient was placed on weight bearing as tolerated. Home med's were  restarted. She was placed on Coumadin for three weeks for deep vein  thrombosis prophylaxis. Electrolytes were followed. The patient did have a  drop in sodium postoperative. Fluids were adjusted. Sodium came back up.  Unfortunately, potassium dropped after surgery and she was placed on  potassium supplements. Potassium responded very well. PT and OT were  consulted to assist with gait training, ambulation, and  activities of daily  living. The patient progressed well with PT, initially ambulating 100 feet  by postoperative day two and even up to  200 feet by postoperative day four.  Dressing change initiated on postoperative day two and incision was healing  well. Weaned off PCA analgesics and switched over to P.O. medications by  January 15, 2002. She was going quite well, taking med's by mouth.  Ambulating well with PT. Meeting goals and was discharged to home with  discharge medications.   DISPOSITION:  Discharged to home on January 15, 2002.   DISCHARGE MEDICATIONS:  Coumadin, Percocet, and Robaxin.   DIET:  Low sodium diet.   FOLLOW UP:  Next week after discharge, call the office for an appointment.   ACTIVITY:  Weight bearing as tolerated. Home health PT and home health  nursing per [facility].   CONDITION ON DISCHARGE:  Improved.     Alexzandrew L. Dara Lords, P.A.              Gaynelle Arabian, MD    ALP/MEDQ  D:  02/13/2002  T:  02/13/2002  Job:  [id number]   cc:   Gaynelle Arabian, MD  [address]  [place]  [place] [address]  Fax: [phone]

## 2011-01-25 ENCOUNTER — Other Ambulatory Visit: Payer: Self-pay | Admitting: Internal Medicine

## 2011-01-25 DIAGNOSIS — Z1231 Encounter for screening mammogram for malignant neoplasm of breast: Secondary | ICD-10-CM

## 2011-02-10 ENCOUNTER — Ambulatory Visit
Admission: RE | Admit: 2011-02-10 | Discharge: 2011-02-10 | Disposition: A | Discharge status: Home / Self Care | Payer: 59 | Source: Ambulatory Visit | Attending: Internal Medicine | Admitting: Internal Medicine

## 2011-02-10 DIAGNOSIS — Z1231 Encounter for screening mammogram for malignant neoplasm of breast: Secondary | ICD-10-CM

## 2011-03-31 ENCOUNTER — Other Ambulatory Visit: Payer: Self-pay | Admitting: Internal Medicine

## 2011-03-31 ENCOUNTER — Ambulatory Visit
Admission: RE | Admit: 2011-03-31 | Discharge: 2011-03-31 | Disposition: A | Discharge status: Home / Self Care | Payer: 59 | Source: Ambulatory Visit | Attending: Internal Medicine | Admitting: Internal Medicine

## 2011-03-31 DIAGNOSIS — S0990XA Unspecified injury of head, initial encounter: Secondary | ICD-10-CM

## 2011-05-04 HISTORY — PX: CATARACT EXTRACTION: SUR2

## 2012-02-18 ENCOUNTER — Other Ambulatory Visit: Payer: Self-pay | Admitting: Internal Medicine

## 2012-02-18 DIAGNOSIS — Z1231 Encounter for screening mammogram for malignant neoplasm of breast: Secondary | ICD-10-CM

## 2012-03-24 ENCOUNTER — Ambulatory Visit
Admission: RE | Admit: 2012-03-24 | Discharge: 2012-03-24 | Disposition: A | Discharge status: Home / Self Care | Payer: Medicare Other | Source: Ambulatory Visit | Attending: Internal Medicine | Admitting: Internal Medicine

## 2012-03-24 DIAGNOSIS — Z1231 Encounter for screening mammogram for malignant neoplasm of breast: Secondary | ICD-10-CM

## 2012-08-19 ENCOUNTER — Encounter: Payer: Self-pay | Admitting: Pharmacist Clinician (PhC)/ Clinical Pharmacy Specialist

## 2012-08-19 DIAGNOSIS — Z7901 Long term (current) use of anticoagulants: Secondary | ICD-10-CM | POA: Insufficient documentation

## 2012-08-19 DIAGNOSIS — I4891 Unspecified atrial fibrillation: Secondary | ICD-10-CM

## 2012-08-29 ENCOUNTER — Other Ambulatory Visit (HOSPITAL_COMMUNITY): Payer: Self-pay | Admitting: Cardiovascular Disease

## 2012-08-29 DIAGNOSIS — I639 Cerebral infarction, unspecified: Secondary | ICD-10-CM

## 2012-09-06 ENCOUNTER — Ambulatory Visit (HOSPITAL_COMMUNITY): Payer: Medicare Other

## 2012-09-19 ENCOUNTER — Ambulatory Visit (HOSPITAL_COMMUNITY)
Admission: RE | Admit: 2012-09-19 | Discharge: 2012-09-19 | Disposition: A | Discharge status: Home / Self Care | Payer: Medicare Other | Source: Ambulatory Visit | Attending: Cardiovascular Disease | Admitting: Cardiovascular Disease

## 2012-09-19 DIAGNOSIS — I4891 Unspecified atrial fibrillation: Secondary | ICD-10-CM | POA: Insufficient documentation

## 2012-09-19 DIAGNOSIS — I639 Cerebral infarction, unspecified: Secondary | ICD-10-CM

## 2012-09-19 DIAGNOSIS — I359 Nonrheumatic aortic valve disorder, unspecified: Secondary | ICD-10-CM | POA: Insufficient documentation

## 2012-09-19 DIAGNOSIS — I6350 Cerebral infarction due to unspecified occlusion or stenosis of unspecified cerebral artery: Secondary | ICD-10-CM | POA: Insufficient documentation

## 2012-09-19 DIAGNOSIS — I059 Rheumatic mitral valve disease, unspecified: Secondary | ICD-10-CM | POA: Insufficient documentation

## 2012-09-19 DIAGNOSIS — I079 Rheumatic tricuspid valve disease, unspecified: Secondary | ICD-10-CM | POA: Insufficient documentation

## 2012-09-19 DIAGNOSIS — I10 Essential (primary) hypertension: Secondary | ICD-10-CM | POA: Insufficient documentation

## 2012-09-19 HISTORY — PX: TRANSTHORACIC ECHOCARDIOGRAM: SHX275

## 2012-09-19 NOTE — Progress Notes (Signed)
[facility]   2D echo completed 09/19/2012.   Jamison Neighbor, RDCS

## 2012-09-27 ENCOUNTER — Ambulatory Visit (INDEPENDENT_AMBULATORY_CARE_PROVIDER_SITE_OTHER): Payer: 59 | Admitting: Pharmacist Clinician (PhC)/ Clinical Pharmacy Specialist

## 2012-09-27 VITALS — BP 156/88 | HR 80

## 2012-09-27 DIAGNOSIS — Z7901 Long term (current) use of anticoagulants: Secondary | ICD-10-CM

## 2012-09-27 DIAGNOSIS — I4891 Unspecified atrial fibrillation: Secondary | ICD-10-CM

## 2012-09-27 LAB — POCT INR: INR: 2.8

## 2012-11-08 ENCOUNTER — Ambulatory Visit (INDEPENDENT_AMBULATORY_CARE_PROVIDER_SITE_OTHER): Payer: Medicare Other | Admitting: Pharmacist Clinician (PhC)/ Clinical Pharmacy Specialist

## 2012-11-08 VITALS — BP 150/60 | HR 72

## 2012-11-08 DIAGNOSIS — I4891 Unspecified atrial fibrillation: Secondary | ICD-10-CM

## 2012-11-08 DIAGNOSIS — Z7901 Long term (current) use of anticoagulants: Secondary | ICD-10-CM

## 2012-11-08 LAB — POCT INR: INR: 1.8

## 2012-11-14 ENCOUNTER — Ambulatory Visit (INDEPENDENT_AMBULATORY_CARE_PROVIDER_SITE_OTHER): Payer: Medicare Other | Admitting: Sports Medicine

## 2012-11-14 VITALS — BP 152/68 | Ht 60.0 in | Wt 110.0 lb

## 2012-11-14 DIAGNOSIS — M2141 Flat foot [pes planus] (acquired), right foot: Secondary | ICD-10-CM | POA: Insufficient documentation

## 2012-11-14 DIAGNOSIS — M2140 Flat foot [pes planus] (acquired), unspecified foot: Secondary | ICD-10-CM

## 2012-11-14 DIAGNOSIS — M129 Arthropathy, unspecified: Secondary | ICD-10-CM

## 2012-11-14 DIAGNOSIS — M2170 Unequal limb length (acquired), unspecified site: Secondary | ICD-10-CM

## 2012-11-14 NOTE — Assessment & Plan Note (Addendum)
Likely as a consequence of her leg length discrepancy and possibly the fracture. Insole with right scaphoid pad given with improvement in discomfort and gait. May consider custom orthotics if the insoles help a lot. She can continue in sports insoles if they do enough to relieve her pain Follow up in 1 month or as needed.

## 2012-11-14 NOTE — Progress Notes (Signed)
Patient ID: Madison Berg, female   DOB: 1923-10-11, 79 y.o.   MRN: [id number] Subjective: Ms. Warbington is here today for a new patient appointment for right foot pain.  She was referred by Dr. Shelia Media and his PA, Baldemar Friday.  She reports that the medial aspect of her right foot causes her significant pain.  She states that she has arthritis in her feet, but the right posterior arch has a bony prominence that makes her shoes uncomfortable.  She has had some pain for several years, but has noticed an increase over the last month or so.  She had an x-ray that showed degenerative changes in 10/2012.    She also fractured the right ankle about 10 years ago.  PMHx: stroke in 2011 (residual balance and speech affects) PSHx: none FHx: 2 sons with HTN SHx: non smoker, no alcohol; retired  Allergies: none Meds: see list  Objective: BP 152/68  Ht 5' (1.524 m)  Wt 110 lb (49.896 kg)  BMI 21.48 kg/m2 Gen: alert, cooperative, very pleasant, NAD Right foot: no erythema, mild swelling in the ankle, holds foot externally rotated with bony prominence at the rear of the arch; minimal tenderness over posterior arch She has tarsal bossing and collapse of the navicular arch Gait: right foot with external rotation and significant pronation Leg length: right leg about 1.5cm longer than left  A/P: 79 yo woman with right foot pain secondary to arthritis, leg length discrepancy, and loss of posterior/mid arch. See problem list for specifics.

## 2012-11-14 NOTE — Assessment & Plan Note (Addendum)
Does have some mild scoliosis but not enough to account for the leg length difference 1cm blue pad in left insole to help correct this

## 2012-11-14 NOTE — Patient Instructions (Addendum)
It was nice to see you!  Wear the insoles in your shoes.  Follow up if the insoles are uncomfortable or make you unsteady.

## 2012-11-14 NOTE — Assessment & Plan Note (Signed)
Diffuse. Likely contributing to some of her foot pain.

## 2012-12-07 ENCOUNTER — Ambulatory Visit (INDEPENDENT_AMBULATORY_CARE_PROVIDER_SITE_OTHER): Payer: Medicare Other | Admitting: Pharmacist Clinician (PhC)/ Clinical Pharmacy Specialist

## 2012-12-07 DIAGNOSIS — I4891 Unspecified atrial fibrillation: Secondary | ICD-10-CM

## 2012-12-07 DIAGNOSIS — Z7901 Long term (current) use of anticoagulants: Secondary | ICD-10-CM

## 2012-12-18 ENCOUNTER — Ambulatory Visit: Payer: Medicare Other | Admitting: Pharmacist Clinician (PhC)/ Clinical Pharmacy Specialist

## 2012-12-21 ENCOUNTER — Ambulatory Visit: Payer: Medicare Other | Admitting: Pharmacist Clinician (PhC)/ Clinical Pharmacy Specialist

## 2012-12-28 ENCOUNTER — Ambulatory Visit (INDEPENDENT_AMBULATORY_CARE_PROVIDER_SITE_OTHER): Payer: Medicare Other | Admitting: Pharmacist Clinician (PhC)/ Clinical Pharmacy Specialist

## 2012-12-28 VITALS — BP 116/60 | HR 64

## 2012-12-28 DIAGNOSIS — Z7901 Long term (current) use of anticoagulants: Secondary | ICD-10-CM

## 2012-12-28 DIAGNOSIS — I4891 Unspecified atrial fibrillation: Secondary | ICD-10-CM

## 2013-01-25 ENCOUNTER — Ambulatory Visit (INDEPENDENT_AMBULATORY_CARE_PROVIDER_SITE_OTHER): Payer: Medicare Other | Admitting: Pharmacist Clinician (PhC)/ Clinical Pharmacy Specialist

## 2013-01-25 VITALS — BP 136/54 | HR 72

## 2013-01-25 DIAGNOSIS — Z7901 Long term (current) use of anticoagulants: Secondary | ICD-10-CM

## 2013-01-25 DIAGNOSIS — I4891 Unspecified atrial fibrillation: Secondary | ICD-10-CM

## 2013-01-30 ENCOUNTER — Encounter: Payer: Self-pay | Admitting: *Deleted

## 2013-01-31 ENCOUNTER — Encounter: Payer: Self-pay | Admitting: Internal Medicine

## 2013-01-31 ENCOUNTER — Ambulatory Visit (INDEPENDENT_AMBULATORY_CARE_PROVIDER_SITE_OTHER): Payer: Medicare Other | Admitting: Internal Medicine

## 2013-01-31 VITALS — BP 148/66 | HR 60 | Ht 60.0 in | Wt 116.4 lb

## 2013-01-31 DIAGNOSIS — I359 Nonrheumatic aortic valve disorder, unspecified: Secondary | ICD-10-CM

## 2013-01-31 DIAGNOSIS — I639 Cerebral infarction, unspecified: Secondary | ICD-10-CM | POA: Insufficient documentation

## 2013-01-31 DIAGNOSIS — I4891 Unspecified atrial fibrillation: Secondary | ICD-10-CM

## 2013-01-31 DIAGNOSIS — I6350 Cerebral infarction due to unspecified occlusion or stenosis of unspecified cerebral artery: Secondary | ICD-10-CM

## 2013-01-31 DIAGNOSIS — I10 Essential (primary) hypertension: Secondary | ICD-10-CM

## 2013-01-31 DIAGNOSIS — I351 Nonrheumatic aortic (valve) insufficiency: Secondary | ICD-10-CM | POA: Insufficient documentation

## 2013-01-31 NOTE — Progress Notes (Addendum)
OFFICE NOTE  Chief Complaint:  Routine followup, former Madison Berg patient  Primary Care Physician: Madison Pel, MD  HPI:  Madison Berg is a pleasant 79 year old female followed for years by Dr. Rollene Berg with a history of paroxysmal atrial fibrillation. She had a stroke in [id number] with an embolic event to her left middle cerebral artery and posterior cerebral artery. She recovered fairly well except she has some mild expressive aphasia. Chest is a history of aortic insufficiency which has been moderate and was recently stable on an echocardiogram in May of 2014. Her ejection fraction is preserved and she said no ischemia. She had a Myoview stress test in February 2011 which was negative. She is on warfarin therapy and her INRs have been therapeutic and stable. She has no complaints of chest pain or shortness of breath. She maintains on amiodarone for rhythm control with her atrial fibrillation. She had recent thyroid function which was in normal limits, but I do not see recent pulmonary function testing. She will need repeat of this testing at her next office visit. Her other main complaint today is problems with bladder incontinence. She previously had seen Dr. Elder Berg and he had advised against medications for incontinence, but her problems have gotten worse and she may be a good candidate for this medication. I understand that she may have had a bladder surgery in the past.  PMHx:  Past Medical History  Diagnosis Date  . PAF (paroxysmal atrial fibrillation)   . CVA (cerebral infarction) 04/2009    LMCA & posterior cerebral - excellent recovery, mild expressive aphasa  . Hypertension   . History of nuclear stress test 06/2009    dipyridamole; negative, normal pattern of perfusion     Past Surgical History  Procedure Laterality Date  . Cataract extraction Bilateral 2013  . Replacement total knee Right 2004  . Appendectomy    . Abdominal hysterectomy  1975  .  Instestinal surgery  1987    r/t blockage  . Nose surgery  1985    r/t deviated septum  . Transthoracic echocardiogram  09/19/2012    EF 55-60% with normal systolic function; mod AR; MVP with mild-mod regurg; LA severely dilated; RV systolic pressure increased; mod dilated RA; RV systolic pressure increased - mild pulm HTN     FAMHx:  Family History  Problem Relation Age of Onset  . Lymphoma Mother   . Aneurysm Other     grandmother, uncles, cousins  . Tuberculosis Father     SOCHx:   reports that she has never smoked. She does not have any smokeless tobacco history on file. Her alcohol and drug histories are not on file.  ALLERGIES:  Allergies  Allergen Reactions  . Atenolol Other (See Comments)    Hair loss, thin nails, itching    ROS: A comprehensive review of systems was negative except for: Respiratory: positive for dyspnea on exertion  HOME MEDS: Current Outpatient Prescriptions  Medication Sig Dispense Refill  . Acetaminophen (TYLENOL PO) Take by mouth as needed.      Marland Kitchen alendronate (FOSAMAX) 70 MG tablet Take 70 mg by mouth every 7 (seven) days. Take with a full glass of water on an empty stomach.      Marland Kitchen amiodarone (PACERONE) 200 MG tablet Take 200 mg by mouth daily.      Marland Kitchen amLODipine (NORVASC) 10 MG tablet Take 10 mg by mouth daily.      . metoprolol succinate (TOPROL-XL) 50 MG 24 hr tablet Take  50 mg by mouth daily. Take with or immediately following a meal.      . warfarin (COUMADIN) 2 MG tablet Take by mouth daily. Per INR       No current facility-administered medications for this visit.    LABS/IMAGING: No results found for this or any previous visit (from the past 48 hour(s)). No results found.  VITALS: BP 148/66  Pulse 60  Ht 5' (1.524 m)  Wt 116 lb 6.4 oz (52.799 kg)  BMI 22.73 kg/m2  EXAM: General appearance: alert and no distress Neck: no adenopathy, no carotid bruit, no JVD, supple, symmetrical, trachea midline and thyroid not enlarged,  symmetric, no tenderness/mass/nodules Lungs: clear to auscultation bilaterally Heart: regular rate and rhythm, S1, S2 normal and diastolic murmur: holodiastolic 2/6, blowing at 2nd right intercostal space Abdomen: soft, non-tender; bowel sounds normal; no masses,  no organomegaly Extremities: extremities normal, atraumatic, no cyanosis or edema Pulses: 2+ and symmetric Skin: Skin color, texture, turgor normal. No rashes or lesions Neurologic: Grossly normal, mild expressive aphasia Psych: Mood, affect normal  EKG: Normal sinus rhythm at 60  ASSESSMENT: 1. Stable moderate aortic insufficiency 2. Paroxysmal atrial fibrillation on warfarin and amiodarone 3. Hypertension-controlled 4. Dizziness with falls-more stable recently, but difficulty walking  PLAN: 1.   Madison Berg is doing fairly well and is therapeutic on Coumadin. She has had some falls in the past including those with head injury and underwent scans but has not had any life-threatening intracranial bleeding. She still has problems with dizziness and some instability however has a prior stroke history and I would like to continue her warfarin until it seems that the risk outweighs the benefit. She had recent echocardiogram which demonstrated stable moderate aortic insufficiency and a preserved EF. Her blood pressure is fairly well controlled for her age., She is requesting a renewal of her handicap parking permit today. I think this is reasonable given her dizziness and slow gait and instability. It would be difficult for her to walk any great distances and she actually drives fairly infrequently.  As the pleasure to take over her care in the future. Please don't hesitate to contact me as always if you have any further questions.  Pixie Casino, MD, [facility] Attending Cardiologist [facility] 01/31/2013, 11:57 AM

## 2013-01-31 NOTE — Patient Instructions (Addendum)
Your physician wants you to follow-up in:  6 months. You will receive a reminder letter in the mail two months in advance. If you don't receive a letter, please call our office to schedule the follow-up appointment.   

## 2013-02-12 ENCOUNTER — Encounter: Payer: Self-pay | Admitting: Cardiovascular Disease

## 2013-02-14 ENCOUNTER — Telehealth: Payer: Self-pay | Admitting: Cardiovascular Disease

## 2013-02-14 NOTE — Telephone Encounter (Signed)
Patient needs a refill on her Coumadin---[facility] in [place] faxed this last week and has not heard back.  Pharmacy phone # [phone]

## 2013-02-14 NOTE — Telephone Encounter (Signed)
Message forwarded to K. Alvstad, PharmD.  

## 2013-02-15 MED ORDER — WARFARIN SODIUM 2 MG PO TABS: ORAL_TABLET | ORAL | Status: DC

## 2013-02-22 ENCOUNTER — Ambulatory Visit: Payer: Medicare Other | Admitting: Pharmacist Clinician (PhC)/ Clinical Pharmacy Specialist

## 2013-02-28 ENCOUNTER — Ambulatory Visit (INDEPENDENT_AMBULATORY_CARE_PROVIDER_SITE_OTHER): Payer: Medicare Other | Admitting: Pharmacist Clinician (PhC)/ Clinical Pharmacy Specialist

## 2013-02-28 VITALS — BP 144/56 | HR 64

## 2013-02-28 DIAGNOSIS — I4891 Unspecified atrial fibrillation: Secondary | ICD-10-CM

## 2013-02-28 DIAGNOSIS — Z7901 Long term (current) use of anticoagulants: Secondary | ICD-10-CM

## 2013-04-02 ENCOUNTER — Ambulatory Visit (INDEPENDENT_AMBULATORY_CARE_PROVIDER_SITE_OTHER): Payer: Medicare Other | Admitting: Pharmacist Clinician (PhC)/ Clinical Pharmacy Specialist

## 2013-04-02 VITALS — BP 142/60 | HR 68

## 2013-04-02 DIAGNOSIS — Z7901 Long term (current) use of anticoagulants: Secondary | ICD-10-CM

## 2013-04-02 DIAGNOSIS — I4891 Unspecified atrial fibrillation: Secondary | ICD-10-CM

## 2013-04-10 ENCOUNTER — Ambulatory Visit: Payer: Medicare Other | Admitting: Cardiovascular Disease

## 2013-04-11 ENCOUNTER — Ambulatory Visit: Payer: Medicare Other | Admitting: Cardiovascular Disease

## 2013-04-30 ENCOUNTER — Ambulatory Visit (INDEPENDENT_AMBULATORY_CARE_PROVIDER_SITE_OTHER): Payer: Medicare Other | Admitting: Pharmacist Clinician (PhC)/ Clinical Pharmacy Specialist

## 2013-04-30 VITALS — BP 140/60 | HR 68

## 2013-04-30 DIAGNOSIS — I4891 Unspecified atrial fibrillation: Secondary | ICD-10-CM

## 2013-04-30 DIAGNOSIS — Z7901 Long term (current) use of anticoagulants: Secondary | ICD-10-CM

## 2013-04-30 LAB — POCT INR: INR: 3.3

## 2013-05-04 ENCOUNTER — Other Ambulatory Visit: Payer: Self-pay | Admitting: *Deleted

## 2013-05-04 MED ORDER — AMIODARONE HCL 200 MG PO TABS: 200.0000 mg | ORAL_TABLET | Freq: Every day | ORAL | Status: DC

## 2013-05-21 ENCOUNTER — Ambulatory Visit (INDEPENDENT_AMBULATORY_CARE_PROVIDER_SITE_OTHER): Payer: Medicare Other | Admitting: Pharmacist Clinician (PhC)/ Clinical Pharmacy Specialist

## 2013-05-21 VITALS — BP 140/56 | HR 72

## 2013-05-21 DIAGNOSIS — I4891 Unspecified atrial fibrillation: Secondary | ICD-10-CM

## 2013-05-21 DIAGNOSIS — Z7901 Long term (current) use of anticoagulants: Secondary | ICD-10-CM

## 2013-05-21 LAB — POCT INR: INR: 2.1

## 2013-06-18 ENCOUNTER — Ambulatory Visit: Payer: Medicare Other | Admitting: Pharmacist Clinician (PhC)/ Clinical Pharmacy Specialist

## 2013-07-04 ENCOUNTER — Ambulatory Visit (INDEPENDENT_AMBULATORY_CARE_PROVIDER_SITE_OTHER): Payer: Medicare Other | Admitting: Pharmacist Clinician (PhC)/ Clinical Pharmacy Specialist

## 2013-07-04 VITALS — BP 140/52 | HR 76

## 2013-07-04 DIAGNOSIS — Z7901 Long term (current) use of anticoagulants: Secondary | ICD-10-CM

## 2013-07-04 DIAGNOSIS — I4891 Unspecified atrial fibrillation: Secondary | ICD-10-CM

## 2013-07-04 LAB — POCT INR: INR: 1.9

## 2013-07-10 ENCOUNTER — Ambulatory Visit (INDEPENDENT_AMBULATORY_CARE_PROVIDER_SITE_OTHER): Payer: Medicare Other | Admitting: Internal Medicine

## 2013-07-10 ENCOUNTER — Encounter: Payer: Self-pay | Admitting: Internal Medicine

## 2013-07-10 VITALS — BP 144/64 | HR 64 | Ht 61.0 in | Wt 108.0 lb

## 2013-07-10 DIAGNOSIS — I4891 Unspecified atrial fibrillation: Secondary | ICD-10-CM

## 2013-07-10 DIAGNOSIS — Z7901 Long term (current) use of anticoagulants: Secondary | ICD-10-CM

## 2013-07-10 DIAGNOSIS — I351 Nonrheumatic aortic (valve) insufficiency: Secondary | ICD-10-CM

## 2013-07-10 DIAGNOSIS — I359 Nonrheumatic aortic valve disorder, unspecified: Secondary | ICD-10-CM

## 2013-07-10 DIAGNOSIS — I639 Cerebral infarction, unspecified: Secondary | ICD-10-CM

## 2013-07-10 DIAGNOSIS — I10 Essential (primary) hypertension: Secondary | ICD-10-CM

## 2013-07-10 DIAGNOSIS — I6350 Cerebral infarction due to unspecified occlusion or stenosis of unspecified cerebral artery: Secondary | ICD-10-CM

## 2013-07-10 NOTE — Patient Instructions (Signed)
Your physician wants you to follow-up in:  6 months. You will receive a reminder letter in the mail two months in advance. If you don't receive a letter, please call our office to schedule the follow-up appointment.   

## 2013-07-10 NOTE — Progress Notes (Signed)
OFFICE NOTE  Chief Complaint:  Routine followup, former Madison Berg patient  Primary Care Physician: Horatio Pel, MD  HPI:  Madison Berg is a pleasant 80 year old female followed for years by Dr. Rollene Berg with a history of paroxysmal atrial fibrillation. She had a stroke in [id number] with an embolic event to her left middle cerebral artery and posterior cerebral artery. She recovered fairly well except she has some mild expressive aphasia. Chest is a history of aortic insufficiency which has been moderate and was recently stable on an echocardiogram in May of 2014. Her ejection fraction is preserved and she said no ischemia. She had a Myoview stress test in February 2011 which was negative. She is on warfarin therapy and her INRs have been therapeutic and stable. She has no complaints of chest pain or shortness of breath. She maintains on amiodarone for rhythm control with her atrial fibrillation. She had recent thyroid function which was in normal limits, but I do not see recent pulmonary function testing. She will need repeat of this testing at her next office visit. Her other main complaint today is problems with bladder incontinence. She previously had seen Dr. Elder Negus and he had advised against medications for incontinence, but her problems have gotten worse and she may be a good candidate for this medication. I understand that she may have had a bladder surgery in the past.  Madison Berg has no new complaints today. She denies any worsening shortness of breath or chest pain. Probably concern is that she is having increasing hair loss. She may be attributed this to beta blockers however I think this medication is important for her rate control.  PMHx:  Past Medical History  Diagnosis Date  . PAF (paroxysmal atrial fibrillation)   . CVA (cerebral infarction) 04/2009    LMCA & posterior cerebral - excellent recovery, mild expressive aphasa  . Hypertension   . History of  nuclear stress test 06/2009    dipyridamole; negative, normal pattern of perfusion     Past Surgical History  Procedure Laterality Date  . Cataract extraction Bilateral 2013  . Replacement total knee Right 2004  . Appendectomy    . Abdominal hysterectomy  1975  . Instestinal surgery  1987    r/t blockage  . Nose surgery  1985    r/t deviated septum  . Transthoracic echocardiogram  09/19/2012    EF 55-60% with normal systolic function; mod AR; MVP with mild-mod regurg; LA severely dilated; RV systolic pressure increased; mod dilated RA; RV systolic pressure increased - mild pulm HTN     FAMHx:  Family History  Problem Relation Age of Onset  . Lymphoma Mother   . Aneurysm Other     grandmother, uncles, cousins  . Tuberculosis Father     SOCHx:   reports that she has never smoked. She does not have any smokeless tobacco history on file. Her alcohol and drug histories are not on file.  ALLERGIES:  Allergies  Allergen Reactions  . Atenolol Other (See Comments)    Hair loss, thin nails, itching    ROS: A comprehensive review of systems was negative except for: Respiratory: positive for dyspnea on exertion  HOME MEDS: Current Outpatient Prescriptions  Medication Sig Dispense Refill  . Acetaminophen (TYLENOL PO) Take by mouth as needed.      Marland Kitchen alendronate (FOSAMAX) 70 MG tablet Take 70 mg by mouth every 7 (seven) days. Take with a full glass of water on an empty stomach.      Marland Kitchen  amiodarone (PACERONE) 200 MG tablet Take 1 tablet (200 mg total) by mouth daily.  30 tablet  6  . amLODipine (NORVASC) 10 MG tablet Take 10 mg by mouth daily.      . metoprolol succinate (TOPROL-XL) 50 MG 24 hr tablet Take 50 mg by mouth daily. Take with or immediately following a meal.      . warfarin (COUMADIN) 2 MG tablet Take 1 tablet by mouth daily or as directed  30 tablet  3   No current facility-administered medications for this visit.    LABS/IMAGING: No results found for this or any  previous visit (from the past 48 hour(s)). No results found.  VITALS: BP 144/64  Pulse 64  Ht 5\' 1"  (1.549 m)  Wt 108 lb (48.988 kg)  BMI 20.42 kg/m2  EXAM: General appearance: alert and no distress Neck: no adenopathy, no carotid bruit, no JVD, supple, symmetrical, trachea midline and thyroid not enlarged, symmetric, no tenderness/mass/nodules Lungs: clear to auscultation bilaterally Heart: regular rate and rhythm, S1, S2 normal and diastolic murmur: holodiastolic 2/6, blowing at 2nd right intercostal space Abdomen: soft, non-tender; bowel sounds normal; no masses,  no organomegaly Extremities: extremities normal, atraumatic, no cyanosis or edema Pulses: 2+ and symmetric Skin: Skin color, texture, turgor normal. No rashes or lesions Neurologic: Grossly normal, mild expressive aphasia Psych: Mood, affect normal  EKG: Normal sinus rhythm at 64, first degree AV block  ASSESSMENT: 1. Stable moderate aortic insufficiency 2. Paroxysmal atrial fibrillation on warfarin and amiodarone 3. Hypertension-controlled 4. Dizziness with falls-more stable recently, but difficulty walking 5. Hair loss  PLAN: 1.   Madison Berg is doing fairly well and is therapeutic on Coumadin (was 1.9 last week). She has had some falls in the past including those with head injury and underwent scans but has not had any life-threatening intracranial bleeding. She still has problems with dizziness and some instability however has a prior stroke history and I would like to continue her warfarin until it seems that the risk outweighs the benefit. She had recent echocardiogram which demonstrated stable moderate aortic insufficiency and a preserved EF. Her blood pressure is fairly well controlled for her age., She is requesting a renewal of her handicap parking permit today. I think this is reasonable given her dizziness and slow gait and instability. It would be difficult for her to walk any great distances and she  actually drives fairly infrequently. She has reported some hair loss, but I think that this is minimal in comparison to the benefit she will get from her medications. I would continue to follow her thyroid to make sure that that is not low, otherwise the beta blocker could be contributing to this. She could consider topical Rogaine.  Please don't hesitate to contact me as always if you have any further questions.  Pixie Casino, MD, [facility] Attending Cardiologist [facility] [place] [facility] 07/10/2013, 5:10 PM

## 2013-08-01 ENCOUNTER — Ambulatory Visit (INDEPENDENT_AMBULATORY_CARE_PROVIDER_SITE_OTHER): Payer: Medicare Other | Admitting: Pharmacist Clinician (PhC)/ Clinical Pharmacy Specialist

## 2013-08-01 DIAGNOSIS — I4891 Unspecified atrial fibrillation: Secondary | ICD-10-CM

## 2013-08-01 DIAGNOSIS — Z7901 Long term (current) use of anticoagulants: Secondary | ICD-10-CM

## 2013-08-01 LAB — POCT INR: INR: 2.2

## 2013-08-29 ENCOUNTER — Ambulatory Visit: Payer: Medicare Other | Admitting: Pharmacist Clinician (PhC)/ Clinical Pharmacy Specialist

## 2013-09-03 ENCOUNTER — Ambulatory Visit: Payer: Medicare Other | Admitting: Pharmacist Clinician (PhC)/ Clinical Pharmacy Specialist

## 2013-09-07 ENCOUNTER — Ambulatory Visit (INDEPENDENT_AMBULATORY_CARE_PROVIDER_SITE_OTHER): Payer: Medicare Other | Admitting: Pharmacist Clinician (PhC)/ Clinical Pharmacy Specialist

## 2013-09-07 DIAGNOSIS — I4891 Unspecified atrial fibrillation: Secondary | ICD-10-CM

## 2013-09-07 DIAGNOSIS — Z7901 Long term (current) use of anticoagulants: Secondary | ICD-10-CM

## 2013-09-07 LAB — POCT INR: INR: 2.6

## 2013-09-17 ENCOUNTER — Ambulatory Visit (INDEPENDENT_AMBULATORY_CARE_PROVIDER_SITE_OTHER): Payer: Medicare Other | Admitting: Sports Medicine

## 2013-09-17 ENCOUNTER — Encounter: Payer: Self-pay | Admitting: Sports Medicine

## 2013-09-17 VITALS — BP 152/66 | Ht 60.0 in | Wt 107.0 lb

## 2013-09-17 DIAGNOSIS — M545 Low back pain, unspecified: Secondary | ICD-10-CM

## 2013-09-17 NOTE — Progress Notes (Signed)
   Subjective:    Patient ID: Waynard Reeds, female    DOB: Jun 16, 1923, 80 y.o.   MRN: [id number]  HPI chief complaint: Low back pain  Very pleasant 80 year old female sent over at the request of Dr.Pharr for evaluation of low back pain. Patient states that she has had low back pain for years but 3 months ago began to experience acute on chronic low back pain. It is intermittent and localized mainly to the right side of her lower back. She is very active for an 80 year old. She denies any recent or remote trauma. She takes an occasional Tylenol but that has not been very helpful. She denies any radiating pain into her groin. No radiating pain into her legs. No associated numbness or tingling in her legs. No recent weight loss. Pain does not awaken her at night. No fevers or chills. She had x-rays done from an outside source which are available for review.  Past medical history reviewed Medications reviewed Allergies reviewed    Review of Systems    as above Objective:   Physical Exam Well-developed. No acute distress. Vital signs reviewed.  Lumbar spine: Limited lumbar range of motion secondary to degenerative disc disease and arthropathy. No significant pain with movement however. There is no tenderness to palpation or percussion along the thoracic or lumbar midline. There is tenderness to palpation along the right paraspinal musculature at the L3-L4 level. Mild amount of spasm as well. No focal neurological deficits of either lower extremity. There is a leg length discrepancy when the patient is sitting but resolves with lying supine.  X-rays of the lumbar spine including AP, lateral, and a spot view dated 09/03/2013 are reviewed. Films are slightly underpenetrated. Patient has multilevel degenerative disc disease and arthropathy with an underlying scoliotic curve. Degenerative spondylolisthesis at L5-S1. No evidence of compression fracture. No obvious bony lesions.       Assessment  & Plan:  Low back pain secondary to multilevel degenerative disc disease/arthropathy  Patient history of atrial fibrillation and use of Coumadin precludes [place] from placing her on NSAIDs. Given her advanced age, I think this would be poorly tolerated anyway. I discussed with the patient the possibility of trying moist heat. I also discussed the possibility of an IM cortisone injection. She would like to try the heat first. She has no red flag symptoms and she seems to be reassured that she doesn't have anything "serious". I think she can continue with activity as tolerated and she will followup with me again in 4 weeks.

## 2013-09-28 ENCOUNTER — Other Ambulatory Visit: Payer: Self-pay | Admitting: Pharmacist Clinician (PhC)/ Clinical Pharmacy Specialist

## 2013-09-28 MED ORDER — WARFARIN SODIUM 2 MG PO TABS: ORAL_TABLET | ORAL | Status: DC

## 2013-10-05 ENCOUNTER — Ambulatory Visit (INDEPENDENT_AMBULATORY_CARE_PROVIDER_SITE_OTHER): Payer: Medicare Other | Admitting: Pharmacist Clinician (PhC)/ Clinical Pharmacy Specialist

## 2013-10-05 DIAGNOSIS — Z7901 Long term (current) use of anticoagulants: Secondary | ICD-10-CM

## 2013-10-05 DIAGNOSIS — I4891 Unspecified atrial fibrillation: Secondary | ICD-10-CM

## 2013-10-05 LAB — POCT INR: INR: 2

## 2013-10-15 ENCOUNTER — Ambulatory Visit: Payer: Medicare Other | Admitting: Sports Medicine

## 2013-10-31 ENCOUNTER — Telehealth: Payer: Self-pay | Admitting: Cardiovascular Disease

## 2013-10-31 ENCOUNTER — Ambulatory Visit: Payer: Medicare Other | Admitting: Pharmacist Clinician (PhC)/ Clinical Pharmacy Specialist

## 2013-10-31 MED ORDER — METOPROLOL SUCCINATE ER 50 MG PO TB24: 50.0000 mg | ORAL_TABLET | Freq: Every day | ORAL | Status: DC

## 2013-10-31 NOTE — Telephone Encounter (Signed)
Need a refill on Metoprolol ER 50 mg #30.

## 2013-10-31 NOTE — Telephone Encounter (Signed)
Rx was sent to pharmacy electronically. 

## 2013-11-14 ENCOUNTER — Ambulatory Visit (INDEPENDENT_AMBULATORY_CARE_PROVIDER_SITE_OTHER): Payer: Medicare Other | Admitting: Pharmacist

## 2013-11-14 ENCOUNTER — Ambulatory Visit: Payer: Medicare Other | Admitting: Pharmacist Clinician (PhC)/ Clinical Pharmacy Specialist

## 2013-11-14 DIAGNOSIS — I4891 Unspecified atrial fibrillation: Secondary | ICD-10-CM

## 2013-11-14 DIAGNOSIS — Z7901 Long term (current) use of anticoagulants: Secondary | ICD-10-CM

## 2013-11-14 LAB — POCT INR: INR: 2.1

## 2013-11-27 ENCOUNTER — Other Ambulatory Visit: Payer: Self-pay | Admitting: Endocrinology

## 2013-11-27 DIAGNOSIS — R1011 Right upper quadrant pain: Secondary | ICD-10-CM

## 2013-11-28 ENCOUNTER — Other Ambulatory Visit: Payer: Self-pay | Admitting: Cardiovascular Disease

## 2013-11-29 NOTE — Telephone Encounter (Signed)
Rx was sent to pharmacy electronically. 

## 2013-11-30 ENCOUNTER — Ambulatory Visit
Admission: RE | Admit: 2013-11-30 | Discharge: 2013-11-30 | Disposition: A | Discharge status: Home / Self Care | Payer: Medicare Other | Source: Ambulatory Visit | Attending: Endocrinology | Admitting: Endocrinology

## 2013-11-30 ENCOUNTER — Other Ambulatory Visit: Payer: Self-pay | Admitting: Endocrinology

## 2013-11-30 ENCOUNTER — Other Ambulatory Visit: Payer: Medicare Other

## 2013-11-30 DIAGNOSIS — R1011 Right upper quadrant pain: Secondary | ICD-10-CM

## 2013-12-12 ENCOUNTER — Ambulatory Visit (INDEPENDENT_AMBULATORY_CARE_PROVIDER_SITE_OTHER): Payer: Medicare Other | Admitting: Pharmacist Clinician (PhC)/ Clinical Pharmacy Specialist

## 2013-12-12 DIAGNOSIS — I4891 Unspecified atrial fibrillation: Secondary | ICD-10-CM

## 2013-12-12 DIAGNOSIS — Z7901 Long term (current) use of anticoagulants: Secondary | ICD-10-CM

## 2013-12-12 LAB — POCT INR: INR: 2.7

## 2014-01-21 ENCOUNTER — Ambulatory Visit (INDEPENDENT_AMBULATORY_CARE_PROVIDER_SITE_OTHER): Payer: Medicare Other | Admitting: Internal Medicine

## 2014-01-21 ENCOUNTER — Ambulatory Visit (INDEPENDENT_AMBULATORY_CARE_PROVIDER_SITE_OTHER): Payer: Medicare Other | Admitting: Pharmacist Clinician (PhC)/ Clinical Pharmacy Specialist

## 2014-01-21 ENCOUNTER — Encounter: Payer: Self-pay | Admitting: Internal Medicine

## 2014-01-21 VITALS — BP 140/60 | HR 64 | Ht 60.0 in | Wt 113.7 lb

## 2014-01-21 DIAGNOSIS — I359 Nonrheumatic aortic valve disorder, unspecified: Secondary | ICD-10-CM

## 2014-01-21 DIAGNOSIS — I480 Paroxysmal atrial fibrillation: Secondary | ICD-10-CM

## 2014-01-21 DIAGNOSIS — Z7901 Long term (current) use of anticoagulants: Secondary | ICD-10-CM

## 2014-01-21 DIAGNOSIS — I4891 Unspecified atrial fibrillation: Secondary | ICD-10-CM

## 2014-01-21 DIAGNOSIS — I639 Cerebral infarction, unspecified: Secondary | ICD-10-CM

## 2014-01-21 DIAGNOSIS — I6350 Cerebral infarction due to unspecified occlusion or stenosis of unspecified cerebral artery: Secondary | ICD-10-CM

## 2014-01-21 DIAGNOSIS — I10 Essential (primary) hypertension: Secondary | ICD-10-CM

## 2014-01-21 DIAGNOSIS — I351 Nonrheumatic aortic (valve) insufficiency: Secondary | ICD-10-CM

## 2014-01-21 LAB — POCT INR: INR: 1.7

## 2014-01-21 NOTE — Patient Instructions (Signed)
Your physician wants you to follow-up in: 6 months with Dr. Hilty. You will receive a reminder letter in the mail two months in advance. If you don't receive a letter, please call our office to schedule the follow-up appointment.    

## 2014-01-21 NOTE — Progress Notes (Signed)
OFFICE NOTE  Chief Complaint:  Routine followup, former [name] patient  Primary Care Physician: Horatio Pel, MD  HPI:  Madison Berg is a pleasant 80 year old female followed for years by Dr. [name] with a history of paroxysmal atrial fibrillation. She had a stroke in [id number] with an embolic event to her left middle cerebral artery and posterior cerebral artery. She recovered fairly well except she has some mild expressive aphasia. Chest is a history of aortic insufficiency which has been moderate and was recently stable on an echocardiogram in May of 2014. Her ejection fraction is preserved and she said no ischemia. She had a Myoview stress test in February 2011 which was negative. She is on warfarin therapy and her INRs have been therapeutic and stable. She has no complaints of chest pain or shortness of breath. She maintains on amiodarone for rhythm control with her atrial fibrillation. She had recent thyroid function which was in normal limits, but I do not see recent pulmonary function testing. She will need repeat of this testing at her next office visit. Her other main complaint today is problems with bladder incontinence. She previously had seen Dr. Elder Negus and he had advised against medications for incontinence, but her problems have gotten worse and she may be a good candidate for this medication. I understand that she may have had a bladder surgery in the past.  [name] has no new complaints today. She denies any worsening shortness of breath or chest pain. She is again concerned about hair loss, however I do not appreciate any significant worsening of the thinning of her hair. She is asked whether it is okay for her to use biotin supplements. I do not have a problem with that. She has had a couple of more falls but is resistant to using a walker due to decreased mobility and independence. She is since stopped driving.  PMHx:  Past Medical History  Diagnosis  Date  . PAF (paroxysmal atrial fibrillation)   . CVA (cerebral infarction) 04/2009    LMCA & posterior cerebral - excellent recovery, mild expressive aphasa  . Hypertension   . History of nuclear stress test 06/2009    dipyridamole; negative, normal pattern of perfusion     Past Surgical History  Procedure Laterality Date  . Cataract extraction Bilateral 2013  . Replacement total knee Right 2004  . Appendectomy    . Abdominal hysterectomy  1975  . Instestinal surgery  1987    r/t blockage  . Nose surgery  1985    r/t deviated septum  . Transthoracic echocardiogram  09/19/2012    EF 55-60% with normal systolic function; mod AR; MVP with mild-mod regurg; LA severely dilated; RV systolic pressure increased; mod dilated RA; RV systolic pressure increased - mild pulm HTN     FAMHx:  Family History  Problem Relation Age of Onset  . Lymphoma Mother   . Aneurysm Other     grandmother, uncles, cousins  . Tuberculosis Father     SOCHx:   reports that she has never smoked. She does not have any smokeless tobacco history on file. Her alcohol and drug histories are not on file.  ALLERGIES:  Allergies  Allergen Reactions  . Atenolol Other (See Comments)    Hair loss, thin nails, itching    ROS: A comprehensive review of systems was negative except for: falls  HOME MEDS: Current Outpatient Prescriptions  Medication Sig Dispense Refill  . Acetaminophen (TYLENOL PO) Take by mouth as  needed.      Marland Kitchen alendronate (FOSAMAX) 70 MG tablet Take 70 mg by mouth every 7 (seven) days. Take with a full glass of water on an empty stomach.      Marland Kitchen amiodarone (PACERONE) 200 MG tablet TAKE 1 TABLET BY MOUTH EVERY DAY  30 tablet  7  . amLODipine (NORVASC) 10 MG tablet Take 10 mg by mouth daily.      Marland Kitchen levothyroxine (SYNTHROID) 25 MCG tablet Take 25 mcg by mouth daily before breakfast.      . metoprolol succinate (TOPROL-XL) 50 MG 24 hr tablet Take 1 tablet (50 mg total) by mouth daily. Take with or  immediately following a meal.  30 tablet  8  . warfarin (COUMADIN) 2 MG tablet Take 1 tablet by mouth daily or as directed  30 tablet  3   No current facility-administered medications for this visit.    LABS/IMAGING: Results for orders placed in visit on 01/21/14 (from the past 48 hour(s))  POCT INR     Status: None   Collection Time    01/21/14  2:34 PM      Result Value Ref Range   INR 1.7     No results found.  VITALS: BP 140/60  Pulse 64  Ht 5' (1.524 m)  Wt 113 lb 11.2 oz (51.574 kg)  BMI 22.21 kg/m2  EXAM: General appearance: alert and no distress Neck: no adenopathy, no carotid bruit, no JVD, supple, symmetrical, trachea midline and thyroid not enlarged, symmetric, no tenderness/mass/nodules Lungs: clear to auscultation bilaterally Heart: regular rate and rhythm, S1, S2 normal and diastolic murmur: holodiastolic 2/6, blowing at 2nd right intercostal space Abdomen: soft, non-tender; bowel sounds normal; no masses,  no organomegaly Extremities: extremities normal, atraumatic, no cyanosis or edema Pulses: 2+ and symmetric Skin: Skin color, texture, turgor normal. No rashes or lesions Neurologic: Grossly normal, mild expressive aphasia Psych: Mood, affect normal  EKG: Normal sinus rhythm at 64  ASSESSMENT: 1. Stable moderate aortic insufficiency 2. Paroxysmal atrial fibrillation on warfarin and amiodarone 3. Hypertension-controlled 4. Dizziness with falls-more stable recently, but difficulty walking 5. Hair loss  PLAN: 1.   [name] is doing fairly well.  INR is slightly low today at 1.7 and will require adjustment. Her AI appears stable. Her hypertension is controlled. She has had some more falls but is hesitant to using a walker. She has number of risk factors for recurrent stroke and given her prior stroke with aphasia this could be somewhat devastating. She is aware that a fall could lead to intracranial bleeding on warfarin, especially she hit her head.  Nevertheless, I still feel that it is beneficial and a stroke reduction risk of warfarin outweighs the risk of intracranial bleeding if she were to fall. We will continue her anticoagulation.  Please don't hesitate to contact me as always if you have any further questions.  Pixie Casino, MD, [facility] Attending Cardiologist [facility] [place] [facility] 01/21/2014, 3:58 PM

## 2014-02-11 ENCOUNTER — Ambulatory Visit: Payer: Medicare Other | Admitting: Pharmacist Clinician (PhC)/ Clinical Pharmacy Specialist

## 2014-02-20 ENCOUNTER — Ambulatory Visit (INDEPENDENT_AMBULATORY_CARE_PROVIDER_SITE_OTHER): Payer: Medicare Other | Admitting: Pharmacist Clinician (PhC)/ Clinical Pharmacy Specialist

## 2014-02-20 DIAGNOSIS — I4891 Unspecified atrial fibrillation: Secondary | ICD-10-CM

## 2014-02-20 DIAGNOSIS — Z7901 Long term (current) use of anticoagulants: Secondary | ICD-10-CM

## 2014-02-20 DIAGNOSIS — I480 Paroxysmal atrial fibrillation: Secondary | ICD-10-CM

## 2014-02-20 LAB — POCT INR: INR: 2.1

## 2014-03-14 ENCOUNTER — Other Ambulatory Visit: Payer: Self-pay | Admitting: Internal Medicine

## 2014-03-14 DIAGNOSIS — R278 Other lack of coordination: Secondary | ICD-10-CM

## 2014-03-14 DIAGNOSIS — Z9181 History of falling: Secondary | ICD-10-CM

## 2014-03-15 ENCOUNTER — Ambulatory Visit (HOSPITAL_COMMUNITY)
Admission: RE | Admit: 2014-03-15 | Discharge: 2014-03-15 | Disposition: A | Discharge status: Home / Self Care | Payer: Medicare Other | Source: Ambulatory Visit | Attending: Internal Medicine | Admitting: Internal Medicine

## 2014-03-15 ENCOUNTER — Other Ambulatory Visit: Payer: Self-pay

## 2014-03-15 ENCOUNTER — Encounter (HOSPITAL_COMMUNITY): Payer: Self-pay | Admitting: *Deleted

## 2014-03-15 ENCOUNTER — Ambulatory Visit (HOSPITAL_COMMUNITY): Payer: Medicare Other

## 2014-03-15 ENCOUNTER — Inpatient Hospital Stay (HOSPITAL_COMMUNITY)
Admission: EM | Admit: 2014-03-15 | Discharge: 2014-03-17 | DRG: 084 | Disposition: A | Discharge status: Home / Self Care | Payer: Medicare Other | Attending: Internal Medicine | Admitting: Internal Medicine

## 2014-03-15 ENCOUNTER — Emergency Department (HOSPITAL_COMMUNITY)
Admission: EM | Admit: 2014-03-15 | Discharge: 2014-03-15 | Discharge status: Absent without leave | Payer: Medicare Other | Source: Home / Self Care

## 2014-03-15 DIAGNOSIS — S065X9A Traumatic subdural hemorrhage with loss of consciousness of unspecified duration, initial encounter: Secondary | ICD-10-CM | POA: Diagnosis not present

## 2014-03-15 DIAGNOSIS — W19XXXA Unspecified fall, initial encounter: Secondary | ICD-10-CM | POA: Diagnosis present

## 2014-03-15 DIAGNOSIS — R278 Other lack of coordination: Secondary | ICD-10-CM

## 2014-03-15 DIAGNOSIS — N289 Disorder of kidney and ureter, unspecified: Secondary | ICD-10-CM | POA: Diagnosis present

## 2014-03-15 DIAGNOSIS — I6201 Nontraumatic acute subdural hemorrhage: Secondary | ICD-10-CM

## 2014-03-15 DIAGNOSIS — Z96651 Presence of right artificial knee joint: Secondary | ICD-10-CM | POA: Diagnosis present

## 2014-03-15 DIAGNOSIS — Z9181 History of falling: Secondary | ICD-10-CM

## 2014-03-15 DIAGNOSIS — Z8673 Personal history of transient ischemic attack (TIA), and cerebral infarction without residual deficits: Secondary | ICD-10-CM

## 2014-03-15 DIAGNOSIS — S40011A Contusion of right shoulder, initial encounter: Secondary | ICD-10-CM | POA: Diagnosis present

## 2014-03-15 DIAGNOSIS — I480 Paroxysmal atrial fibrillation: Secondary | ICD-10-CM | POA: Diagnosis present

## 2014-03-15 DIAGNOSIS — I639 Cerebral infarction, unspecified: Secondary | ICD-10-CM | POA: Diagnosis present

## 2014-03-15 DIAGNOSIS — R260 Ataxic gait: Secondary | ICD-10-CM | POA: Diagnosis present

## 2014-03-15 DIAGNOSIS — I4891 Unspecified atrial fibrillation: Secondary | ICD-10-CM | POA: Diagnosis present

## 2014-03-15 DIAGNOSIS — Z7901 Long term (current) use of anticoagulants: Secondary | ICD-10-CM

## 2014-03-15 DIAGNOSIS — Z9841 Cataract extraction status, right eye: Secondary | ICD-10-CM

## 2014-03-15 DIAGNOSIS — I10 Essential (primary) hypertension: Secondary | ICD-10-CM | POA: Diagnosis present

## 2014-03-15 DIAGNOSIS — Y929 Unspecified place or not applicable: Secondary | ICD-10-CM

## 2014-03-15 DIAGNOSIS — Z9842 Cataract extraction status, left eye: Secondary | ICD-10-CM

## 2014-03-15 DIAGNOSIS — I351 Nonrheumatic aortic (valve) insufficiency: Secondary | ICD-10-CM | POA: Diagnosis present

## 2014-03-15 DIAGNOSIS — S065XAA Traumatic subdural hemorrhage with loss of consciousness status unknown, initial encounter: Secondary | ICD-10-CM

## 2014-03-15 DIAGNOSIS — R40243 Glasgow coma scale score 3-8: Secondary | ICD-10-CM | POA: Diagnosis present

## 2014-03-15 DIAGNOSIS — Z66 Do not resuscitate: Secondary | ICD-10-CM | POA: Diagnosis present

## 2014-03-15 DIAGNOSIS — I6203 Nontraumatic chronic subdural hemorrhage: Secondary | ICD-10-CM

## 2014-03-15 LAB — URINALYSIS, ROUTINE W REFLEX MICROSCOPIC
Bilirubin Urine: NEGATIVE
GLUCOSE, UA: NEGATIVE mg/dL
Hgb urine dipstick: NEGATIVE
Ketones, ur: 15 mg/dL — AB
LEUKOCYTES UA: NEGATIVE
NITRITE: NEGATIVE
Protein, ur: NEGATIVE mg/dL
SPECIFIC GRAVITY, URINE: 1.011 (ref 1.005–1.030)
Urobilinogen, UA: 0.2 mg/dL (ref 0.0–1.0)
pH: 6.5 (ref 5.0–8.0)

## 2014-03-15 LAB — COMPREHENSIVE METABOLIC PANEL
ALBUMIN: 4.1 g/dL (ref 3.5–5.2)
ALT: 23 U/L (ref 0–35)
AST: 37 U/L (ref 0–37)
Alkaline Phosphatase: 83 U/L (ref 39–117)
Anion gap: 16 — ABNORMAL HIGH (ref 5–15)
BUN: 37 mg/dL — ABNORMAL HIGH (ref 6–23)
CALCIUM: 9.3 mg/dL (ref 8.4–10.5)
CO2: 21 meq/L (ref 19–32)
Chloride: 100 mEq/L (ref 96–112)
Creatinine, Ser: 1.58 mg/dL — ABNORMAL HIGH (ref 0.50–1.10)
GFR calc Af Amer: 33 mL/min — ABNORMAL LOW (ref >90)
GFR calc non Af Amer: 28 mL/min — ABNORMAL LOW (ref >90)
Glucose, Bld: 110 mg/dL — ABNORMAL HIGH (ref 70–99)
Potassium: 4.2 mEq/L (ref 3.7–5.3)
Sodium: 137 mEq/L (ref 137–147)
Total Bilirubin: 0.8 mg/dL (ref 0.3–1.2)
Total Protein: 7 g/dL (ref 6.0–8.3)

## 2014-03-15 LAB — CBC WITH DIFFERENTIAL/PLATELET
BASOS ABS: 0 10*3/uL (ref 0.0–0.1)
BASOS PCT: 0 % (ref 0–1)
EOS PCT: 0 % (ref 0–5)
Eosinophils Absolute: 0 10*3/uL (ref 0.0–0.7)
HCT: 34.2 % — ABNORMAL LOW (ref 36.0–46.0)
Hemoglobin: 11.7 g/dL — ABNORMAL LOW (ref 12.0–15.0)
Lymphocytes Relative: 10 % — ABNORMAL LOW (ref 12–46)
Lymphs Abs: 0.7 10*3/uL (ref 0.7–4.0)
MCH: 33.1 pg (ref 26.0–34.0)
MCHC: 34.2 g/dL (ref 30.0–36.0)
MCV: 96.9 fL (ref 78.0–100.0)
Monocytes Absolute: 0.7 10*3/uL (ref 0.1–1.0)
Monocytes Relative: 9 % (ref 3–12)
Neutro Abs: 6.1 10*3/uL (ref 1.7–7.7)
Neutrophils Relative %: 81 % — ABNORMAL HIGH (ref 43–77)
PLATELETS: 279 10*3/uL (ref 150–400)
RBC: 3.53 MIL/uL — ABNORMAL LOW (ref 3.87–5.11)
RDW: 15 % (ref 11.5–15.5)
WBC: 7.5 10*3/uL (ref 4.0–10.5)

## 2014-03-15 LAB — PROTIME-INR
INR: 2.3 — ABNORMAL HIGH (ref 0.00–1.49)
PROTHROMBIN TIME: 25.5 s — AB (ref 11.6–15.2)

## 2014-03-15 LAB — APTT: APTT: 40 s — AB (ref 24–37)

## 2014-03-15 MED ORDER — VITAMIN K1 10 MG/ML IJ SOLN
5.0000 mg | Freq: Once | INTRAVENOUS | Status: AC
  Administered 2014-03-16: 5 mg via INTRAVENOUS
  Filled 2014-03-15: qty 0.5

## 2014-03-15 NOTE — ED Notes (Signed)
Family at bedside. 

## 2014-03-15 NOTE — ED Notes (Signed)
The pts speech is clear .  Both pupils are equal and react to light   aprox size 2.0

## 2014-03-15 NOTE — ED Provider Notes (Signed)
CSN: [id number]     Arrival date & time 03/15/14  1847 History   First MD Initiated Contact with Patient 03/15/14 1857     Chief Complaint  Patient presents with  . cerebral hemorrhage      (Consider location/radiation/quality/duration/timing/severity/associated sxs/prior Treatment) HPI History is obtained from patient and from patient's son. Patient has fallen 2 times in the past 2 weeks strike her head she felt proximally 2 weeks ago and again the following day. She complained of slight right shoulder pain after the fall but has no pain today. Her son reports she normally has difficulty speaking as result of old strokes but has becomeslightly worse within the past 5 days. She is also lost her ability to write 5 or 6 days ago. Patient denies pain anywhere at present.patient had out patient CT scan of her brain performed today which was consistent with subdural hematoma. Sent here for further evaluation Past Medical History  Diagnosis Date  . PAF (paroxysmal atrial fibrillation)   . CVA (cerebral infarction) 04/2009    LMCA & posterior cerebral - excellent recovery, mild expressive aphasa  . Hypertension   . History of nuclear stress test 06/2009    dipyridamole; negative, normal pattern of perfusion    Past Surgical History  Procedure Laterality Date  . Cataract extraction Bilateral 2013  . Replacement total knee Right 2004  . Appendectomy    . Abdominal hysterectomy  1975  . Instestinal surgery  1987    r/t blockage  . Nose surgery  1985    r/t deviated septum  . Transthoracic echocardiogram  09/19/2012    EF 55-60% with normal systolic function; mod AR; MVP with mild-mod regurg; LA severely dilated; RV systolic pressure increased; mod dilated RA; RV systolic pressure increased - mild pulm HTN    Family History  Problem Relation Age of Onset  . Lymphoma Mother   . Aneurysm Other     grandmother, uncles, cousins  . Tuberculosis Father    History  Substance Use Topics  .  Smoking status: Never Smoker   . Smokeless tobacco: Not on file  . Alcohol Use: Not on file   OB History    No data available     Review of Systems  Genitourinary:       Urinary incontinence for greater than 5 years  Musculoskeletal: Positive for arthralgias and gait problem.       Walks with caneright shoulder pain-resolved  Neurological: Positive for speech difficulty.  Hematological: Bruises/bleeds easily.  All other systems reviewed and are negative.     Allergies  Atenolol  Home Medications   Prior to Admission medications   Medication Sig Start Date End Date Taking? Authorizing Provider  Acetaminophen (TYLENOL PO) Take by mouth as needed.    Historical Provider, MD  alendronate (FOSAMAX) 70 MG tablet Take 70 mg by mouth every 7 (seven) days. Take with a full glass of water on an empty stomach.    Historical Provider, MD  amiodarone (PACERONE) 200 MG tablet TAKE 1 TABLET BY MOUTH EVERY DAY    Pixie Casino, MD  amLODipine (NORVASC) 10 MG tablet Take 10 mg by mouth daily.    Historical Provider, MD  levothyroxine (SYNTHROID) 25 MCG tablet Take 25 mcg by mouth daily before breakfast.    Historical Provider, MD  metoprolol succinate (TOPROL-XL) 50 MG 24 hr tablet Take 1 tablet (50 mg total) by mouth daily. Take with or immediately following a meal. 10/31/13   Pixie Casino,  MD  warfarin (COUMADIN) 2 MG tablet Take 1 tablet by mouth daily or as directed 09/28/13   Tommy Medal, RPH-CPP   BP 165/62 mmHg  Temp(Src) 97.7 F (36.5 C) (Oral)  Resp 16  SpO2 100% Physical Exam  Constitutional:  Chronically ill-appearing  HENT:  Head: Normocephalic and atraumatic.  Eyes: Conjunctivae are normal. Pupils are equal, round, and reactive to light.  Neck: Neck supple. No tracheal deviation present. No thyromegaly present.  Cardiovascular: Normal rate and regular rhythm.   No murmur heard. Pulmonary/Chest: Effort normal and breath sounds normal.  Abdominal: Soft. Bowel  sounds are normal. She exhibits no distension. There is no tenderness.  Musculoskeletal: Normal range of motion. She exhibits no edema or tenderness.  Neurological: She is alert. No cranial nerve deficit. Coordination normal.  Stutters when speakingthe motor strength 5 over 5 overall DTRs symmetric bilaterally knee jerk ankle jerk biceps toes downward going bilaterally  Skin: Skin is warm and dry. No rash noted.  Psychiatric: She has a normal mood and affect.  Nursing note and vitals reviewed.   ED Course  Procedures (including critical care time) Labs Review Labs Reviewed - No data to display  Imaging Review Ct Head Wo Contrast  03/15/2014   ADDENDUM REPORT: 03/15/2014 17:26  ADDENDUM: Critical Value/emergent results were called by telephone at the time of interpretation on 03/15/2014 at 5:26 pm to nurse Herby Abraham who verbally acknowledged these results.   Electronically Signed   By: Jacqulynn Cadet M.D.   On: 03/15/2014 17:26   03/15/2014   CLINICAL DATA:  80 year old female with frequent falls the past 2 weeks resulting in head impact and loss of consciousness.  EXAM: CT HEAD WITHOUT CONTRAST  TECHNIQUE: Contiguous axial images were obtained from the base of the skull through the vertex without intravenous contrast.  COMPARISON:  Prior head CT 03/31/2011  FINDINGS: Positive for acute a.m. likely subacute/ chronic subdural hematoma. High attenuation material is noted overlying the inferior left temporal and occipital lobes. Additionally, there is high attenuation thickening within the left tentorium cerebelli and inferior aspect of the posterior falx consistent with subdural hematoma. Small focal high attenuation in the right aspect of the cerebellar vermis is nonspecific and may represent a focal contusion. Additionally, there is a heterogeneous intermediate and high attenuation extra-axial collection overlying the left frontal cortex consistent with subacute or chronic subdural hematoma.  The maximum width is 12 mm. There is associated local mass effect without significant sulcal effacement or edema.  Stable left temporoparietal remote infarct. Focal hypoattenuation in the right centrum semiovale is also unchanged and likely represents a remote insult. Microvascular ischemic white matter disease is stable. No new ventriculomegaly or intraventricular hemorrhage. No definite subarachnoid hemorrhage.  Normal aeration of the mastoid air cells and visualized paranasal sinuses. Concha bullosa noted in the bilateral superior turbinates. No focal scalp hematoma or calvarial fracture.  IMPRESSION: 1. Positive for both acute and subacute/chronic subdural hemorrhage as detailed above. Acute subdural hemorrhage is noted along the left aspect of the inferior posterior falx extending along the left tentorium cerebelli and overlying the left occipital and inferior temporal lobes. 2. Subacute/chronic subdural hematoma is noted overlying the left frontal and anterior parietal cortex. The maximal width of this extra-axial collection is 1.2 cm. 3. Small high attenuation focus in the right aspect of the cerebellar vermis not definitively present on prior and may represent a small focal hemorrhagic contusion. 4. Otherwise, stable atrophy, remote prior left temporoparietal infarct, and chronic microvascular ischemic white matter  disease. Attempting to contact the referring physician with critical results. Will update report with addendum when physician reached.  Electronically Signed: By: Jacqulynn Cadet M.D. On: 03/15/2014 16:36     EKG Interpretation None      Date: 03/15/2014  Rate: 80  Rhythm: normal sinus rhythm  QRS Axis: normal  Intervals: normal  ST/T Wave abnormalities: nonspecific ST/T changes  Conduction Disutrbances:none  Narrative Interpretation:   Old EKG Reviewed: no significant change from 01/21/2014 interpreted by me Spoke with Dr.Cabbell, neurosurgeon who suggest that patient does not  require surgery based on his review of her CT scan. She should stop Coumadin. If her INR is greater than 2 she should be observed by hospitalist service. She should stop Coumadin for at least one month given intracerebral bleed Results for orders placed or performed during the hospital encounter of 03/15/14  CBC with Differential  Result Value Ref Range   WBC 7.5 4.0 - 10.5 K/uL   RBC 3.53 (L) 3.87 - 5.11 MIL/uL   Hemoglobin 11.7 (L) 12.0 - 15.0 g/dL   HCT 34.2 (L) 36.0 - 46.0 %   MCV 96.9 78.0 - 100.0 fL   MCH 33.1 26.0 - 34.0 pg   MCHC 34.2 30.0 - 36.0 g/dL   RDW 15.0 11.5 - 15.5 %   Platelets 279 150 - 400 K/uL   Neutrophils Relative % 81 (H) 43 - 77 %   Neutro Abs 6.1 1.7 - 7.7 K/uL   Lymphocytes Relative 10 (L) 12 - 46 %   Lymphs Abs 0.7 0.7 - 4.0 K/uL   Monocytes Relative 9 3 - 12 %   Monocytes Absolute 0.7 0.1 - 1.0 K/uL   Eosinophils Relative 0 0 - 5 %   Eosinophils Absolute 0.0 0.0 - 0.7 K/uL   Basophils Relative 0 0 - 1 %   Basophils Absolute 0.0 0.0 - 0.1 K/uL  Comprehensive metabolic panel  Result Value Ref Range   Sodium 137 137 - 147 mEq/L   Potassium 4.2 3.7 - 5.3 mEq/L   Chloride 100 96 - 112 mEq/L   CO2 21 19 - 32 mEq/L   Glucose, Bld 110 (H) 70 - 99 mg/dL   BUN 37 (H) 6 - 23 mg/dL   Creatinine, Ser 1.58 (H) 0.50 - 1.10 mg/dL   Calcium 9.3 8.4 - 10.5 mg/dL   Total Protein 7.0 6.0 - 8.3 g/dL   Albumin 4.1 3.5 - 5.2 g/dL   AST 37 0 - 37 U/L   ALT 23 0 - 35 U/L   Alkaline Phosphatase 83 39 - 117 U/L   Total Bilirubin 0.8 0.3 - 1.2 mg/dL   GFR calc non Af Amer 28 (L) >90 mL/min   GFR calc Af Amer 33 (L) >90 mL/min   Anion gap 16 (H) 5 - 15  APTT  Result Value Ref Range   aPTT 40 (H) 24 - 37 seconds  Urinalysis, Routine w reflex microscopic  Result Value Ref Range   Color, Urine YELLOW YELLOW   APPearance CLEAR CLEAR   Specific Gravity, Urine 1.011 1.005 - 1.030   pH 6.5 5.0 - 8.0   Glucose, UA NEGATIVE NEGATIVE mg/dL   Hgb urine dipstick NEGATIVE  NEGATIVE   Bilirubin Urine NEGATIVE NEGATIVE   Ketones, ur 15 (A) NEGATIVE mg/dL   Protein, ur NEGATIVE NEGATIVE mg/dL   Urobilinogen, UA 0.2 0.0 - 1.0 mg/dL   Nitrite NEGATIVE NEGATIVE   Leukocytes, UA NEGATIVE NEGATIVE  Protime-INR  Result Value Ref Range  Prothrombin Time 25.5 (H) 11.6 - 15.2 seconds   INR 2.30 (H) 0.00 - 1.49   Ct Head Wo Contrast  03/15/2014   ADDENDUM REPORT: 03/15/2014 17:26  ADDENDUM: Critical Value/emergent results were called by telephone at the time of interpretation on 03/15/2014 at 5:26 pm to nurse Herby Abraham who verbally acknowledged these results.   Electronically Signed   By: Jacqulynn Cadet M.D.   On: 03/15/2014 17:26   03/15/2014   CLINICAL DATA:  80 year old female with frequent falls the past 2 weeks resulting in head impact and loss of consciousness.  EXAM: CT HEAD WITHOUT CONTRAST  TECHNIQUE: Contiguous axial images were obtained from the base of the skull through the vertex without intravenous contrast.  COMPARISON:  Prior head CT 03/31/2011  FINDINGS: Positive for acute a.m. likely subacute/ chronic subdural hematoma. High attenuation material is noted overlying the inferior left temporal and occipital lobes. Additionally, there is high attenuation thickening within the left tentorium cerebelli and inferior aspect of the posterior falx consistent with subdural hematoma. Small focal high attenuation in the right aspect of the cerebellar vermis is nonspecific and may represent a focal contusion. Additionally, there is a heterogeneous intermediate and high attenuation extra-axial collection overlying the left frontal cortex consistent with subacute or chronic subdural hematoma. The maximum width is 12 mm. There is associated local mass effect without significant sulcal effacement or edema.  Stable left temporoparietal remote infarct. Focal hypoattenuation in the right centrum semiovale is also unchanged and likely represents a remote insult. Microvascular  ischemic white matter disease is stable. No new ventriculomegaly or intraventricular hemorrhage. No definite subarachnoid hemorrhage.  Normal aeration of the mastoid air cells and visualized paranasal sinuses. Concha bullosa noted in the bilateral superior turbinates. No focal scalp hematoma or calvarial fracture.  IMPRESSION: 1. Positive for both acute and subacute/chronic subdural hemorrhage as detailed above. Acute subdural hemorrhage is noted along the left aspect of the inferior posterior falx extending along the left tentorium cerebelli and overlying the left occipital and inferior temporal lobes. 2. Subacute/chronic subdural hematoma is noted overlying the left frontal and anterior parietal cortex. The maximal width of this extra-axial collection is 1.2 cm. 3. Small high attenuation focus in the right aspect of the cerebellar vermis not definitively present on prior and may represent a small focal hemorrhagic contusion. 4. Otherwise, stable atrophy, remote prior left temporoparietal infarct, and chronic microvascular ischemic white matter disease. Attempting to contact the referring physician with critical results. Will update report with addendum when physician reached.  Electronically Signed: By: Jacqulynn Cadet M.D. On: 03/15/2014 16:36    MDM  I spoke with Dr. Marin Comment recommends vitamin K 5 mg intravenously. Patient does not require rapid Coumadin reversal per Dr. Cyndy Freeze or Dr.Le. Dr. Marin Comment will arrange for inpatient stay Final diagnoses:  None  x-ray of left shoulder not indicated. Discussed with patient who agrees Diagnosis #1 subdural hematoma #2 contusion right shoulder #3 fall #4 renal insufficiency     Orlie Dakin, MD 03/15/14 2356

## 2014-03-15 NOTE — ED Notes (Signed)
The pt is   Dozing at present.  Son at the bedside

## 2014-03-15 NOTE — ED Notes (Signed)
The pt has no leg drift but the pt reports that   She has nio strength to stand.  The pts son reports that when the pt stands she crosses her legs and   She has had numerus falls  In the past 2 years according to the  Pt. She had a stroke 5 years ago and her speech has been like this since.  She also has  Urinary incontinence

## 2014-03-15 NOTE — ED Notes (Signed)
Patient asked for and received a Happy Meal and a Sprite.

## 2014-03-15 NOTE — ED Notes (Signed)
The pt arrived by gems from the pts home where she lives with her son.  She fell one week ago on Saturday.  She had a c-t earlier today and was called at home and told she had a bleed in her head.  On arrival alert  Skin warm and dry.  The pt  Reports that she has a painful rt shoulder bruised and sl swollen.  The pt has to  Search fir her words when questions are asked

## 2014-03-16 DIAGNOSIS — Z9181 History of falling: Secondary | ICD-10-CM | POA: Diagnosis not present

## 2014-03-16 DIAGNOSIS — W19XXXA Unspecified fall, initial encounter: Secondary | ICD-10-CM | POA: Diagnosis present

## 2014-03-16 DIAGNOSIS — S065XAA Traumatic subdural hemorrhage with loss of consciousness status unknown, initial encounter: Secondary | ICD-10-CM | POA: Diagnosis present

## 2014-03-16 DIAGNOSIS — S40011A Contusion of right shoulder, initial encounter: Secondary | ICD-10-CM | POA: Diagnosis present

## 2014-03-16 DIAGNOSIS — I480 Paroxysmal atrial fibrillation: Secondary | ICD-10-CM | POA: Diagnosis present

## 2014-03-16 DIAGNOSIS — Z9841 Cataract extraction status, right eye: Secondary | ICD-10-CM | POA: Diagnosis not present

## 2014-03-16 DIAGNOSIS — Z9842 Cataract extraction status, left eye: Secondary | ICD-10-CM | POA: Diagnosis not present

## 2014-03-16 DIAGNOSIS — N289 Disorder of kidney and ureter, unspecified: Secondary | ICD-10-CM | POA: Diagnosis present

## 2014-03-16 DIAGNOSIS — I6203 Nontraumatic chronic subdural hemorrhage: Secondary | ICD-10-CM

## 2014-03-16 DIAGNOSIS — Y929 Unspecified place or not applicable: Secondary | ICD-10-CM | POA: Diagnosis not present

## 2014-03-16 DIAGNOSIS — Z96651 Presence of right artificial knee joint: Secondary | ICD-10-CM | POA: Diagnosis present

## 2014-03-16 DIAGNOSIS — S065X9A Traumatic subdural hemorrhage with loss of consciousness of unspecified duration, initial encounter: Secondary | ICD-10-CM | POA: Diagnosis present

## 2014-03-16 DIAGNOSIS — R40243 Glasgow coma scale score 3-8: Secondary | ICD-10-CM | POA: Diagnosis present

## 2014-03-16 DIAGNOSIS — Z66 Do not resuscitate: Secondary | ICD-10-CM | POA: Diagnosis present

## 2014-03-16 DIAGNOSIS — I6201 Nontraumatic acute subdural hemorrhage: Secondary | ICD-10-CM

## 2014-03-16 DIAGNOSIS — Z8673 Personal history of transient ischemic attack (TIA), and cerebral infarction without residual deficits: Secondary | ICD-10-CM | POA: Diagnosis not present

## 2014-03-16 DIAGNOSIS — R260 Ataxic gait: Secondary | ICD-10-CM | POA: Diagnosis present

## 2014-03-16 DIAGNOSIS — Z7901 Long term (current) use of anticoagulants: Secondary | ICD-10-CM | POA: Diagnosis not present

## 2014-03-16 DIAGNOSIS — I351 Nonrheumatic aortic (valve) insufficiency: Secondary | ICD-10-CM | POA: Diagnosis present

## 2014-03-16 DIAGNOSIS — I10 Essential (primary) hypertension: Secondary | ICD-10-CM | POA: Diagnosis present

## 2014-03-16 LAB — PROTIME-INR
INR: 1.67 — AB (ref 0.00–1.49)
Prothrombin Time: 19.9 seconds — ABNORMAL HIGH (ref 11.6–15.2)

## 2014-03-16 MED ORDER — AMLODIPINE BESYLATE 10 MG PO TABS
10.0000 mg | ORAL_TABLET | Freq: Every day | ORAL | Status: DC
  Administered 2014-03-16: 10 mg via ORAL
  Filled 2014-03-16: qty 1

## 2014-03-16 MED ORDER — AMIODARONE HCL 200 MG PO TABS
200.0000 mg | ORAL_TABLET | Freq: Every day | ORAL | Status: DC
  Administered 2014-03-16 – 2014-03-17 (×2): 200 mg via ORAL
  Filled 2014-03-16 (×2): qty 1

## 2014-03-16 MED ORDER — ACETAMINOPHEN 500 MG PO TABS: 500.0000 mg | ORAL_TABLET | Freq: Four times a day (QID) | ORAL | Status: DC | PRN

## 2014-03-16 MED ORDER — ALENDRONATE SODIUM 70 MG PO TABS: 70.0000 mg | ORAL_TABLET | ORAL | Status: DC

## 2014-03-16 MED ORDER — LEVOTHYROXINE SODIUM 25 MCG PO TABS
25.0000 ug | ORAL_TABLET | Freq: Every day | ORAL | Status: DC
  Administered 2014-03-16 – 2014-03-17 (×2): 25 ug via ORAL
  Filled 2014-03-16 (×2): qty 1

## 2014-03-16 MED ORDER — METOPROLOL SUCCINATE ER 25 MG PO TB24
50.0000 mg | ORAL_TABLET | Freq: Every day | ORAL | Status: DC
  Administered 2014-03-16: 50 mg via ORAL
  Filled 2014-03-16: qty 2

## 2014-03-16 NOTE — Progress Notes (Signed)
Patient arrived to the floor, alert and oriented, accompanied by son. Patient oriented to room and equipment. Initial assessment performed. MAEW except RUE. Some expressive aphasia noted. NAD noted. Will monitor closely overnight for any change in condition.

## 2014-03-16 NOTE — Plan of Care (Signed)
Problem: Acute Treatment Outcomes Goal: 02 Sats > 94% Outcome: Completed/Met Date Met:  03/16/14     

## 2014-03-16 NOTE — Evaluation (Signed)
Physical Therapy Evaluation Patient Details Name: Madison Berg MRN: [id number] DOB: 1923-08-29 Today's Date: 03/16/2014   History of Present Illness  Madison Berg is an 80 y.o. female with hx of PAF on amiodarone along with coumadin for past 5 years for CHADS 4, hx of HTN, ataxic gait and frequent falls, fell 2 weeks ago, and fell again today.  She saw her PCP and an outpatient CT of the head showed acute on chronic subdural hematoma, along with possible brain contusion, and prior CVA.  Clinical Impression  Pt very pleasant, easily distracted with difficulty with word finding at times. Pt educated and reinforced for need to have supervision present in room with all mobility prior to mobility and tub transfers, use of RW at all times and need for decreased speed with transitions all to decrease falls and increase safety. Pt would benefit from continued therapy to maximize mobility, gait and safety adhering to these recommendations to decrease fall risk and burden of care.     Follow Up Recommendations Home health PT;Supervision for mobility/OOB    Equipment Recommendations  None recommended by PT    Recommendations for Other Services       Precautions / Restrictions Precautions Precautions: Fall Precaution Comments: right rotator cuff tear Restrictions Weight Bearing Restrictions: No      Mobility  Bed Mobility Overal bed mobility: Needs Assistance Bed Mobility: Supine to Sit;Sit to Supine     Supine to sit: Supervision Sit to supine: Modified independent (Device/Increase time)   General bed mobility comments: cues for sequence to limit force on right shoulder  Transfers Overall transfer level: Needs assistance   Transfers: Sit to/from Stand;Stand Pivot Transfers Sit to Stand: Supervision Stand pivot transfers: Supervision       General transfer comment: supervision for safety and use of RW  Ambulation/Gait Ambulation/Gait assistance: Min  guard Ambulation Distance (Feet): 350 Feet Assistive device: Rolling walker (2 wheeled) Gait Pattern/deviations: Step-through pattern;Decreased stride length;Narrow base of support   Gait velocity interpretation: at or above normal speed for age/gender General Gait Details: increased toe out with narrow BOS and scissoring at times, constant cues for posture and RW use.   Stairs            Wheelchair Mobility    Modified Rankin (Stroke Patients Only) Modified Rankin (Stroke Patients Only) Pre-Morbid Rankin Score: Moderate disability Modified Rankin: Moderately severe disability     Balance Overall balance assessment: Needs assistance   Sitting balance-Leahy Scale: Good       Standing balance-Leahy Scale: Fair                               Pertinent Vitals/Pain Pain Assessment: No/denies pain    Home Living Family/patient expects to be discharged to:: Private residence Living Arrangements: Children Available Help at Discharge: Family;Available 24 hours/day Type of Home: House Home Access: Stairs to enter   [employer] of Steps: 2 Home Layout: One level Home Equipment: Walker - 2 wheels;Walker - 4 wheels;Cane - single point;Bedside commode;Shower seat      Prior Function Level of Independence: Independent with assistive device(s)         Comments: pt was bathing, dressing, transferring and walking on her own with assist for stairs and family does housework     Hand Dominance   Dominant Hand: Right    Extremity/Trunk Assessment   Upper Extremity Assessment: RUE deficits/detail;Generalized weakness RUE Deficits / Details: decreased shoulder  flexion, internal and external rotation with pt report of rotator cuff tear         Lower Extremity Assessment: Generalized weakness      Cervical / Trunk Assessment: Kyphotic  Communication   Communication: Expressive difficulties  Cognition Arousal/Alertness: Awake/alert Behavior  During Therapy: Restless Overall Cognitive Status: History of cognitive impairments - at baseline                      General Comments      Exercises        Assessment/Plan    PT Assessment Patient needs continued PT services  PT Diagnosis Abnormality of gait;Altered mental status   PT Problem List Decreased strength;Decreased range of motion;Decreased balance;Decreased safety awareness;Decreased knowledge of use of DME  PT Treatment Interventions Gait training;DME instruction;Stair training;Functional mobility training;Therapeutic activities;Wheelchair mobility training;Patient/family education;Cognitive remediation;Neuromuscular re-education   PT Goals (Current goals can be found in the Care Plan section) Acute Rehab PT Goals Patient Stated Goal: be able to walk with my cane PT Goal Formulation: With patient Time For Goal Achievement: 03/30/14 Potential to Achieve Goals: Fair    Frequency Min 3X/week   Barriers to discharge        Co-evaluation               End of Session   Activity Tolerance: Patient tolerated treatment well Patient left: in bed;with call bell/phone within reach;with family/visitor present Nurse Communication: Mobility status    Functional Assessment Tool Used: clinical judgement Functional Limitation: Mobility: Walking and moving around Mobility: Walking and Moving Around Current Status [id number]): At least 1 percent but less than 20 percent impaired, limited or restricted Mobility: Walking and Moving Around Goal Status [phone]): At least 1 percent but less than 20 percent impaired, limited or restricted    Time: [id number] PT Time Calculation (min) (ACUTE ONLY): 32 min   Charges:   PT Evaluation $Initial PT Evaluation Tier I: 1 Procedure PT Treatments $Gait Training: 8-22 mins $Therapeutic Activity: 8-22 mins   PT G Codes:   Functional Assessment Tool Used: clinical judgement Functional Limitation: Mobility: Walking and moving  around    Melford Aase 03/16/2014, 11:30 AM  Elwyn Reach, [place]

## 2014-03-16 NOTE — Progress Notes (Signed)
UR completed 

## 2014-03-16 NOTE — Plan of Care (Signed)
Problem: Acute Treatment Outcomes Goal: Pain controlled Outcome: Completed/Met Date Met:  03/16/14     

## 2014-03-16 NOTE — ED Notes (Signed)
Pt alert  No complaints.  Son at the bedside

## 2014-03-16 NOTE — Plan of Care (Signed)
Problem: Acute Treatment Outcomes Goal: Airway maintained/protected Outcome: Completed/Met Date Met:  03/16/14

## 2014-03-16 NOTE — H&P (Signed)
[facility] History and Physical  ROSSELYN Berg [id number] DOB: [birth year]    PCP:   Horatio Pel, MD   Chief Complaint: fall.  Outpatient CT head showed subdural hematoma.  HPI: Madison Berg is an 80 y.o. female with hx of PAF on amiodarone along with coumadin for past 5 years for CHADS 4, hx of HTN, ataxic gait and frequent falls, fell 2 weeks ago, and fell again today.  She saw her PCP and an outpatient CT of the head showed acute on chronic subdural hematoma, along with possible brain contusion, and prior CVA.  Neurosurgery Dr Frederich Cha was consulted by EDP, and recommended no anticoagulation, and if INR is less than 2.0, she can be discharged with one month follow up.  He did not feel reversal is mandatory.  Her INR was 2.3, and hospitalist was thus asked to admit her OBS.  She has no HA, and her aphasia which she had before, felt to be a little worse.  Rewiew of Systems:  Constitutional: Negative for malaise, fever and chills. No significant weight loss or weight gain Eyes: Negative for eye pain, redness and discharge, diplopia, visual changes, or flashes of light. ENMT: Negative for ear pain, hoarseness, nasal congestion, sinus pressure and sore throat. No headaches; tinnitus, drooling, or problem swallowing. Cardiovascular: Negative for chest pain, palpitations, diaphoresis, dyspnea and peripheral edema. ; No orthopnea, PND Respiratory: Negative for cough, hemoptysis, wheezing and stridor. No pleuritic chestpain. Gastrointestinal: Negative for nausea, vomiting, diarrhea, constipation, abdominal pain, melena, blood in stool, hematemesis, jaundice and rectal bleeding.    Genitourinary: Negative for frequency, dysuria, incontinence,flank pain and hematuria; Musculoskeletal: Negative for back pain and neck pain. Negative for swelling and trauma.;  Skin: . Negative for pruritus, rash, abrasions, bruising and skin lesion.; ulcerations Neuro: Negative for headache,  lightheadedness and neck stiffness. Negative for weakness, altered level of consciousness , altered mental status, extremity weakness, burning feet, involuntary movement, seizure and syncope.  Psych: negative for anxiety, depression, insomnia, tearfulness, panic attacks, hallucinations, paranoia, suicidal or homicidal ideation    Past Medical History  Diagnosis Date  . PAF (paroxysmal atrial fibrillation)   . CVA (cerebral infarction) 04/2009    LMCA & posterior cerebral - excellent recovery, mild expressive aphasa  . Hypertension   . History of nuclear stress test 06/2009    dipyridamole; negative, normal pattern of perfusion     Past Surgical History  Procedure Laterality Date  . Cataract extraction Bilateral 2013  . Replacement total knee Right 2004  . Appendectomy    . Abdominal hysterectomy  1975  . Instestinal surgery  1987    r/t blockage  . Nose surgery  1985    r/t deviated septum  . Transthoracic echocardiogram  09/19/2012    EF 55-60% with normal systolic function; mod AR; MVP with mild-mod regurg; LA severely dilated; RV systolic pressure increased; mod dilated RA; RV systolic pressure increased - mild pulm HTN     Medications:  HOME MEDS: Prior to Admission medications   Medication Sig Start Date End Date Taking? Authorizing Provider  acetaminophen (TYLENOL) 500 MG tablet Take 500 mg by mouth every 6 (six) hours as needed for mild pain.   Yes Historical Provider, MD  alendronate (FOSAMAX) 70 MG tablet Take 70 mg by mouth every 7 (seven) days. Take with a full glass of water on an empty stomach.   Yes Historical Provider, MD  amiodarone (PACERONE) 200 MG tablet TAKE 1 TABLET BY MOUTH EVERY DAY   Yes  Pixie Casino, MD  amLODipine (NORVASC) 10 MG tablet Take 10 mg by mouth daily.   Yes Historical Provider, MD  levothyroxine (SYNTHROID) 25 MCG tablet Take 25 mcg by mouth daily before breakfast.   Yes Historical Provider, MD  metoprolol succinate (TOPROL-XL) 50 MG 24 hr  tablet Take 1 tablet (50 mg total) by mouth daily. Take with or immediately following a meal. 10/31/13  Yes Pixie Casino, MD  Acetaminophen (TYLENOL PO) Take by mouth as needed.    Historical Provider, MD     Allergies:  Allergies  Allergen Reactions  . Atenolol Other (See Comments)    Hair loss, thin nails, itching    Social History:   reports that she has never smoked. She does not have any smokeless tobacco history on file. Her alcohol and drug histories are not on file.  Family History: Family History  Problem Relation Age of Onset  . Lymphoma Mother   . Aneurysm Other     grandmother, uncles, cousins  . Tuberculosis Father      Physical Exam: Filed Vitals:   03/15/14 2115 03/15/14 2301 03/15/14 2303 03/16/14 0015  BP: 132/60 120/53  151/68  Pulse: 72 63  76  Temp:  98.4 F (36.9 C)    TempSrc:      Resp: 14 18  20   Height:   5' (1.524 m)   Weight:   48.988 kg (108 lb)   SpO2: 100% 99%  99%   Blood pressure 151/68, pulse 76, temperature 98.4 F (36.9 C), temperature source Oral, resp. rate 20, height 5' (1.524 m), weight 48.988 kg (108 lb), SpO2 99 %.  GEN:  Pleasant patient lying in the stretcher in no acute distress; cooperative with exam. PSYCH:  alert and oriented x4; does not appear anxious or depressed; affect is appropriate. HEENT: Mucous membranes pink and anicteric; PERRLA; EOM intact; no cervical lymphadenopathy nor thyromegaly or carotid bruit; no JVD; There were no stridor. Neck is very supple. She has cannon waves. Breasts:: Not examined CHEST WALL: No tenderness CHEST: Normal respiration, clear to auscultation bilaterally.  HEART: Regular rate and rhythm.  There are no murmur, rub, or gallops.   BACK: No kyphosis or scoliosis; no CVA tenderness ABDOMEN: soft and non-tender; no masses, no organomegaly, normal abdominal bowel sounds; no pannus; no intertriginous candida. There is no rebound and no distention. Rectal Exam: Not done EXTREMITIES: No  bone or joint deformity; age-appropriate arthropathy of the hands and knees; no edema; no ulcerations.  There is no calf tenderness. Genitalia: not examined PULSES: 2+ and symmetric SKIN: Normal hydration no rash or ulceration CNS: Cranial nerves 2-12 grossly intact no focal lateralizing neurologic deficit.  Speech is fluent; uvula elevated with phonation, facial symmetry and tongue midline. DTR are normal bilaterally, cerebella exam is intact, barbinski is negative and strengths are equaled bilaterally.  No sensory loss.   Labs on Admission:  Basic Metabolic Panel:  Recent Labs Lab 03/15/14 2050  NA 137  K 4.2  CL 100  CO2 21  GLUCOSE 110*  BUN 37*  CREATININE 1.58*  CALCIUM 9.3   Liver Function Tests:  Recent Labs Lab 03/15/14 2050  AST 37  ALT 23  ALKPHOS 83  BILITOT 0.8  PROT 7.0  ALBUMIN 4.1   No results for input(s): LIPASE, AMYLASE in the last 168 hours. No results for input(s): AMMONIA in the last 168 hours. CBC:  Recent Labs Lab 03/15/14 2050  WBC 7.5  NEUTROABS 6.1  HGB 11.7*  HCT 34.2*  MCV 96.9  PLT 279   Cardiac Enzymes: No results for input(s): CKTOTAL, CKMB, CKMBINDEX, TROPONINI in the last 168 hours.  CBG: No results for input(s): GLUCAP in the last 168 hours.   Radiological Exams on Admission: Ct Head Wo Contrast  03/15/2014   ADDENDUM REPORT: 03/15/2014 17:26  ADDENDUM: Critical Value/emergent results were called by telephone at the time of interpretation on 03/15/2014 at 5:26 pm to nurse Herby Abraham who verbally acknowledged these results.   Electronically Signed   By: Jacqulynn Cadet M.D.   On: 03/15/2014 17:26   03/15/2014   CLINICAL DATA:  80 year old female with frequent falls the past 2 weeks resulting in head impact and loss of consciousness.  EXAM: CT HEAD WITHOUT CONTRAST  TECHNIQUE: Contiguous axial images were obtained from the base of the skull through the vertex without intravenous contrast.  COMPARISON:  Prior head CT  03/31/2011  FINDINGS: Positive for acute a.m. likely subacute/ chronic subdural hematoma. High attenuation material is noted overlying the inferior left temporal and occipital lobes. Additionally, there is high attenuation thickening within the left tentorium cerebelli and inferior aspect of the posterior falx consistent with subdural hematoma. Small focal high attenuation in the right aspect of the cerebellar vermis is nonspecific and may represent a focal contusion. Additionally, there is a heterogeneous intermediate and high attenuation extra-axial collection overlying the left frontal cortex consistent with subacute or chronic subdural hematoma. The maximum width is 12 mm. There is associated local mass effect without significant sulcal effacement or edema.  Stable left temporoparietal remote infarct. Focal hypoattenuation in the right centrum semiovale is also unchanged and likely represents a remote insult. Microvascular ischemic white matter disease is stable. No new ventriculomegaly or intraventricular hemorrhage. No definite subarachnoid hemorrhage.  Normal aeration of the mastoid air cells and visualized paranasal sinuses. Concha bullosa noted in the bilateral superior turbinates. No focal scalp hematoma or calvarial fracture.  IMPRESSION: 1. Positive for both acute and subacute/chronic subdural hemorrhage as detailed above. Acute subdural hemorrhage is noted along the left aspect of the inferior posterior falx extending along the left tentorium cerebelli and overlying the left occipital and inferior temporal lobes. 2. Subacute/chronic subdural hematoma is noted overlying the left frontal and anterior parietal cortex. The maximal width of this extra-axial collection is 1.2 cm. 3. Small high attenuation focus in the right aspect of the cerebellar vermis not definitively present on prior and may represent a small focal hemorrhagic contusion. 4. Otherwise, stable atrophy, remote prior left temporoparietal  infarct, and chronic microvascular ischemic white matter disease. Attempting to contact the referring physician with critical results. Will update report with addendum when physician reached.  Electronically Signed: By: Jacqulynn Cadet M.D. On: 03/15/2014 16:36    EKG: Independently reviewed. NSR.   Assessment/Plan Present on Admission:  . Atrial fibrillation . Essential hypertension . Stroke . Aortic insufficiency . Subdural hematoma  PLAN: Despite her high CHADS score of 4, given her ataxia, age, and frequent falls with acute on chronic subdural hematoma, she should no longer be on full anticoagulation.  Guideline would indicate ASA and Plavix when deemed safe by neurosurgery, however, not now.  Will give her 5mg  Vit K IV today.  As soon as her INR normalizes, and no progression of neurological symptoms, she can be discharged to home.  I have continued her other meds.  I confirmed her desire for a DNR code status and will honor her wishes.  She is stable and will be admitted OBS  under hospitalist service.  Thank you for allowing me to participate in the care of your nice patient.  Other plans as per orders.  Code Status: DNR.   Orvan Falconer, MD. [facility] Pager [phone] 7pm to 7am.  03/16/2014, 12:53 AM

## 2014-03-16 NOTE — Progress Notes (Signed)
Pt seen and examined, admitted this am per Dr.Le 89/F with AFib on coumadin, H/o Falls, prior CVAs was admitted after yet another fall CT with small Acute on chronic SDH, INR was 2.3 on admission. NSG, Dr.Cabell consulted, who recommended stopping Coumadin and observation only She was given Vitamin K x1, INR now 1.6 Will repeat CT head in am Pt/OT Evals DNR  Domenic Polite, MD [phone]

## 2014-03-16 NOTE — ED Notes (Signed)
Report given to rn on 4n

## 2014-03-16 NOTE — Plan of Care (Signed)
Problem: Progression Outcomes Goal: Hemodynamically stable Outcome: Completed/Met Date Met:  03/16/14

## 2014-03-16 NOTE — Plan of Care (Signed)
Problem: Acute Treatment Outcomes Goal: Neuro exam at baseline or improved Outcome: Completed/Met Date Met:  03/16/14 Goal: BP within ordered parameters Outcome: Completed/Met Date Met:  03/16/14

## 2014-03-17 ENCOUNTER — Inpatient Hospital Stay (HOSPITAL_COMMUNITY): Payer: Medicare Other

## 2014-03-17 ENCOUNTER — Encounter (HOSPITAL_COMMUNITY): Payer: Self-pay | Admitting: Radiology

## 2014-03-17 DIAGNOSIS — I6200 Nontraumatic subdural hemorrhage, unspecified: Secondary | ICD-10-CM

## 2014-03-17 DIAGNOSIS — I10 Essential (primary) hypertension: Secondary | ICD-10-CM

## 2014-03-17 DIAGNOSIS — I480 Paroxysmal atrial fibrillation: Secondary | ICD-10-CM

## 2014-03-17 LAB — BASIC METABOLIC PANEL
Anion gap: 13 (ref 5–15)
BUN: 33 mg/dL — AB (ref 6–23)
CO2: 22 meq/L (ref 19–32)
CREATININE: 1.35 mg/dL — AB (ref 0.50–1.10)
Calcium: 9 mg/dL (ref 8.4–10.5)
Chloride: 103 mEq/L (ref 96–112)
GFR calc Af Amer: 39 mL/min — ABNORMAL LOW (ref >90)
GFR, EST NON AFRICAN AMERICAN: 34 mL/min — AB (ref >90)
Glucose, Bld: 94 mg/dL (ref 70–99)
Potassium: 4.3 mEq/L (ref 3.7–5.3)
SODIUM: 138 meq/L (ref 137–147)

## 2014-03-17 LAB — PROTIME-INR
INR: 1.28 (ref 0.00–1.49)
Prothrombin Time: 16.1 seconds — ABNORMAL HIGH (ref 11.6–15.2)

## 2014-03-17 MED ORDER — METOPROLOL SUCCINATE ER 25 MG PO TB24: 50.0000 mg | ORAL_TABLET | Freq: Every day | ORAL | Status: DC

## 2014-03-17 NOTE — Discharge Summary (Signed)
Physician Discharge Summary  Madison Berg [id number] DOB: 07-25-1923 DOA: 03/15/2014  PCP: Horatio Pel, MD  Admit date: 03/15/2014 Discharge date: 03/17/2014  Time spent: 35 minutes  Recommendations for Outpatient Follow-up:  Home health  Discharge Diagnoses:  Principal Problem:   Acute on chronic intracranial subdural hematoma Active Problems:   Essential hypertension   Atrial fibrillation   Aortic insufficiency   Stroke   Subdural hematoma   Discharge Condition: improved  Diet recommendation: cardiac  Filed Weights   03/15/14 2303 03/16/14 0210  Weight: 48.988 kg (108 lb) 48.852 kg (107 lb 11.2 oz)    History of present illness:  Madison Berg is an 80 y.o. female with hx of PAF on amiodarone along with coumadin for past 5 years for CHADS 4, hx of HTN, ataxic gait and frequent falls, fell 2 weeks ago, and fell again today. She saw her PCP and an outpatient CT of the head showed acute on chronic subdural hematoma, along with possible brain contusion, and prior CVA. Neurosurgery Dr Frederich Cha was consulted by EDP, and recommended no anticoagulation, and if INR is less than 2.0, she can be discharged with one month follow up. He did not feel reversal is mandatory. Her INR was 2.3, and hospitalist was thus asked to admit her OBS. She has no HA, and her aphasia which she had before, felt to be a little worse.  Hospital Course:  Despite her high CHADS score of 4, given her ataxia, age, and frequent falls with acute on chronic subdural hematoma, she should no longer be on full anticoagulation. Guideline would indicate ASA and Plavix when deemed safe by neurosurgery, however, not now. s/p 5mg  Vit K IV today. CT scan stable D/c home with home health and 24 hour supervision by family  Procedures:    Consultations:  NS  Discharge Exam: Filed Vitals:   03/17/14 1435  BP: 120/46  Pulse: 58  Temp: 98.3 F (36.8 C)  Resp: 16    General: A+Ox3,  NAD- difficultly with words Cardiovascular: rrr Respiratory: clear  Discharge Instructions You were cared for by a hospitalist during your hospital stay. If you have any questions about your discharge medications or the care you received while you were in the hospital after you are discharged, you can call the unit and asked to speak with the hospitalist on call if the hospitalist that took care of you is not available. Once you are discharged, your primary care physician will handle any further medical issues. Please note that NO REFILLS for any discharge medications will be authorized once you are discharged, as it is imperative that you return to your primary care physician (or establish a relationship with a primary care physician if you do not have one) for your aftercare needs so that they can reassess your need for medications and monitor your lab values.  Discharge Instructions    Diet - low sodium heart healthy    Complete by:  As directed      Discharge instructions    Complete by:  As directed   Home health 24 hour supervision by family No driving -needs to be seen by neurosurgery before resumption of ASA/plavix     Increase activity slowly    Complete by:  As directed           Discharge Medication List as of 03/17/2014  2:38 PM    CONTINUE these medications which have CHANGED   Details  metoprolol succinate (TOPROL-XL) 25 MG 24 hr tablet  Take 2 tablets (50 mg total) by mouth daily. Take with or immediately following a meal., Starting 03/17/2014, Until Discontinued, Print      CONTINUE these medications which have NOT CHANGED   Details  acetaminophen (TYLENOL) 500 MG tablet Take 500 mg by mouth every 6 (six) hours as needed for mild pain., Until Discontinued, Historical Med    alendronate (FOSAMAX) 70 MG tablet Take 70 mg by mouth every 7 (seven) days. Take with a full glass of water on an empty stomach., Until Discontinued, Historical Med    amiodarone (PACERONE) 200 MG  tablet TAKE 1 TABLET BY MOUTH EVERY DAY, Normal    amLODipine (NORVASC) 10 MG tablet Take 10 mg by mouth daily., Until Discontinued, Historical Med    levothyroxine (SYNTHROID) 25 MCG tablet Take 25 mcg by mouth daily before breakfast., Until Discontinued, Historical Med      STOP taking these medications     Acetaminophen (TYLENOL PO)        Allergies  Allergen Reactions  . Atenolol Other (See Comments)    Hair loss, thin nails, itching   Follow-up Information    Follow up with Horatio Pel, MD In 1 week.   Specialty:  Internal Medicine   Contact information:   [place] [address] [phone]       Follow up with CABBELL,KYLE L, MD In 1 month.   Specialty:  Neurosurgery   Contact information:   [address] [place] [address] [phone]       Follow up with [facility].   Why:  home health physical and occupational therapy and aide   Contact information:   [address] [place] [address] [phone]        The results of significant diagnostics from this hospitalization (including imaging, microbiology, ancillary and laboratory) are listed below for reference.    Significant Diagnostic Studies: Ct Head Wo Contrast  03/17/2014   CLINICAL DATA:  80 year old female for follow-up of left subdural hematoma. Patient with right arm tremors.  EXAM: CT HEAD WITHOUT CONTRAST  TECHNIQUE: Contiguous axial images were obtained from the base of the skull through the vertex without intravenous contrast.  COMPARISON:  03/15/2014 and prior head CTs dating back to 07/01/2005  FINDINGS: Left subdural hematoma of mixed ages is unchanged with the more acute portion located inferiorly and layering on the tentorium. The maximal diameter of the more acute component measures 5 mm in the left temporal region. Maximal diameter of the subacute-chronic component measures 8 mm in the left frontal region.   Atrophy, chronic small-vessel white matter ischemic changes, remote left MCA infarct are again noted.  No new findings are identified. Is no evidence of midline shift, hydrocephalus or new areas of hemorrhage.  Visualized bony calvarium is unremarkable.  IMPRESSION: Unchanged left subdural hematomas of mixed ages. No new hemorrhage, midline shift or hydrocephalus.  Atrophy, chronic small-vessel white matter ischemic changes and remote left MCA infarct.   Electronically Signed   By: Hassan Rowan M.D.   On: 03/17/2014 11:19   Ct Head Wo Contrast  03/15/2014   ADDENDUM REPORT: 03/15/2014 17:26  ADDENDUM: Critical Value/emergent results were called by telephone at the time of interpretation on 03/15/2014 at 5:26 pm to nurse Herby Abraham who verbally acknowledged these results.   Electronically Signed   By: Jacqulynn Cadet M.D.   On: 03/15/2014 17:26   03/15/2014   CLINICAL DATA:  80 year old female with frequent  falls the past 2 weeks resulting in head impact and loss of consciousness.  EXAM: CT HEAD WITHOUT CONTRAST  TECHNIQUE: Contiguous axial images were obtained from the base of the skull through the vertex without intravenous contrast.  COMPARISON:  Prior head CT 03/31/2011  FINDINGS: Positive for acute a.m. likely subacute/ chronic subdural hematoma. High attenuation material is noted overlying the inferior left temporal and occipital lobes. Additionally, there is high attenuation thickening within the left tentorium cerebelli and inferior aspect of the posterior falx consistent with subdural hematoma. Small focal high attenuation in the right aspect of the cerebellar vermis is nonspecific and may represent a focal contusion. Additionally, there is a heterogeneous intermediate and high attenuation extra-axial collection overlying the left frontal cortex consistent with subacute or chronic subdural hematoma. The maximum width is 12 mm. There is associated local mass effect without significant sulcal effacement  or edema.  Stable left temporoparietal remote infarct. Focal hypoattenuation in the right centrum semiovale is also unchanged and likely represents a remote insult. Microvascular ischemic white matter disease is stable. No new ventriculomegaly or intraventricular hemorrhage. No definite subarachnoid hemorrhage.  Normal aeration of the mastoid air cells and visualized paranasal sinuses. Concha bullosa noted in the bilateral superior turbinates. No focal scalp hematoma or calvarial fracture.  IMPRESSION: 1. Positive for both acute and subacute/chronic subdural hemorrhage as detailed above. Acute subdural hemorrhage is noted along the left aspect of the inferior posterior falx extending along the left tentorium cerebelli and overlying the left occipital and inferior temporal lobes. 2. Subacute/chronic subdural hematoma is noted overlying the left frontal and anterior parietal cortex. The maximal width of this extra-axial collection is 1.2 cm. 3. Small high attenuation focus in the right aspect of the cerebellar vermis not definitively present on prior and may represent a small focal hemorrhagic contusion. 4. Otherwise, stable atrophy, remote prior left temporoparietal infarct, and chronic microvascular ischemic white matter disease. Attempting to contact the referring physician with critical results. Will update report with addendum when physician reached.  Electronically Signed: By: Jacqulynn Cadet M.D. On: 03/15/2014 16:36    Microbiology: No results found for this or any previous visit (from the past 240 hour(s)).   Labs: Basic Metabolic Panel:  Recent Labs Lab 03/15/14 2050 03/17/14 0633  NA 137 138  K 4.2 4.3  CL 100 103  CO2 21 22  GLUCOSE 110* 94  BUN 37* 33*  CREATININE 1.58* 1.35*  CALCIUM 9.3 9.0   Liver Function Tests:  Recent Labs Lab 03/15/14 2050  AST 37  ALT 23  ALKPHOS 83  BILITOT 0.8  PROT 7.0  ALBUMIN 4.1   No results for input(s): LIPASE, AMYLASE in the last 168  hours. No results for input(s): AMMONIA in the last 168 hours. CBC:  Recent Labs Lab 03/15/14 2050  WBC 7.5  NEUTROABS 6.1  HGB 11.7*  HCT 34.2*  MCV 96.9  PLT 279   Cardiac Enzymes: No results for input(s): CKTOTAL, CKMB, CKMBINDEX, TROPONINI in the last 168 hours. BNP: BNP (last 3 results) No results for input(s): PROBNP in the last 8760 hours. CBG: No results for input(s): GLUCAP in the last 168 hours.     SignedEulogio Bear  [facility] 03/17/2014, 4:28 PM

## 2014-03-17 NOTE — Progress Notes (Signed)
Patient back from CT scan.

## 2014-03-17 NOTE — Progress Notes (Signed)
Patient been d/c home, d/c instruction given and patient and care giver verbalized understanding.

## 2014-03-17 NOTE — Plan of Care (Signed)
Problem: Progression Outcomes Goal: Bowel & Bladder Continence Outcome: Completed/Met Date Met:  03/17/14

## 2014-03-17 NOTE — Progress Notes (Signed)
Patient reported intermittent tremors in right arm lasting about 30 to 45 seconds. Patient reported she first noticed it about a week ago.

## 2014-03-17 NOTE — Progress Notes (Signed)
Patient just left the floor for a f/u CT scan.

## 2014-03-17 NOTE — Plan of Care (Signed)
Problem: Progression Outcomes Goal: Pain controlled Outcome: Completed/Met Date Met:  03/17/14     

## 2014-03-17 NOTE — Progress Notes (Signed)
Occupational Therapy Evaluation Patient Details Name: Madison Berg MRN: [id number] DOB: 15-Dec-1923 Today's Date: 03/17/2014    History of Present Illness HSER BELANGER is an 81 y.o. female with hx of PAF on amiodarone along with coumadin for past 5 years for CHADS 4, hx of HTN, ataxic gait and frequent falls, fell 2 weeks ago, and fell again today.  She saw her PCP and an outpatient CT of the head showed acute on chronic subdural hematoma, along with possible brain contusion, and prior CVA.   Clinical Impression   PTA pt lived at home and was independent with use of assistive devices. Pt presents with expressive language deficit and RUE impairment due to rotator cuff tear (pre-morbid). Pt also with generalized weakness. Pt's independence is impaired due to these problems and pt requires Min guard for ADLs due to decreased balance. Pt's son present for education and pt issued built up foam to increase independence with self-feeding and writing. Pt is planned for d/c today if CT results are clear and pt's OT needs will be met at next venue of care ([place]).     Follow Up Recommendations  Home health OT;Supervision/Assistance - 24 hour;Other (comment) ([place] aide)    Equipment Recommendations  None recommended by OT    Recommendations for Other Services       Precautions / Restrictions Precautions Precautions: Fall Precaution Comments: right rotator cuff tear Restrictions Weight Bearing Restrictions: No      Mobility Bed Mobility               General bed mobility comments: Pt sitting up in recliner when OT arrived.   Transfers Overall transfer level: Needs assistance Equipment used: Rolling walker (2 wheeled) Transfers: Sit to/from Stand Sit to Stand: Supervision         General transfer comment: supervision for safety and use of RW         ADL Overall ADL's : Needs assistance/impaired Eating/Feeding: Set up;Sitting   Grooming: Set up;Sitting   Upper  Body Bathing: Set up;Sitting Upper Body Bathing Details (indicate cue type and reason): pt utilizes compensatory techniques to wash UB given RUE deficits Lower Body Bathing: Min guard;Sit to/from stand   Upper Body Dressing : Set up;Sitting Upper Body Dressing Details (indicate cue type and reason): pt utilizes compensatory techniques Lower Body Dressing: Sit to/from stand;Min guard;Cueing for safety Lower Body Dressing Details (indicate cue type and reason): pt threaded LLE through pant leg and then stood to attempt to thread RLE. Encouraged pt to sit to don pants due to her balance.  Toilet Transfer: Min guard;Ambulation;RW;Comfort height toilet   Toileting- Clothing Manipulation and Hygiene: Min guard;Sit to/from Nurse, children's Details (indicate cue type and reason): pt sponge bathes and will plan to continue Functional mobility during ADLs: Min guard;Rolling walker General ADL Comments: Pt requires min guard for balance and safety during ADLs and functional mobility. Pt appears aware of her expressive language deficits and reports that she is aware that her balance is off and that she does not want to fall. However, pt also demonstrating some unsafe behaviors such as attempting to stand and balance on one foot to don pants. When asked if this was the safest way, pt responded "yes." Encouraged pt to sit for all ADLs for safety and energy conservation. Also educated pt on having supervision during ALL mobility. Pt's son was present for assessment and education. Pt issued built up foam handle to assist with grasp during self-feeding  and writing. Pt's son reports that he can procure more "pipe foam" from his construction work to provide for [employer] and other items.      Vision  Pt reports no change from baseline. No apparent deficits.                    Perception Perception Perception Tested?: No   Praxis Praxis Praxis tested?: Within functional limits     Pertinent Vitals/Pain Pain Assessment: No/denies pain     Hand Dominance Right   Extremity/Trunk Assessment Upper Extremity Assessment Upper Extremity Assessment: RUE deficits/detail;Generalized weakness RUE Deficits / Details: decreased shoulder flexion, internal and external rotation with pt report of rotator cuff tear. Pt also has noticable arthritic deformities of both hands R>L, and c/o difficulty grasping utensils and writing.  RUE Coordination: decreased gross motor   Lower Extremity Assessment Lower Extremity Assessment: Generalized weakness   Cervical / Trunk Assessment Cervical / Trunk Assessment: Kyphotic   Communication Communication Communication: Expressive difficulties   Cognition Arousal/Alertness: Awake/alert Behavior During Therapy: WFL for tasks assessed/performed Overall Cognitive Status: History of cognitive impairments - at baseline                                Home Living Family/patient expects to be discharged to:: Private residence Living Arrangements: Children Available Help at Discharge: Family;Available 24 hours/day Type of Home: House Home Access: Stairs to enter [employer] of Steps: 2   Home Layout: One level     Bathroom Shower/Tub: Teacher, early years/pre: Standard     Home Equipment: Environmental consultant - 2 wheels;Walker - 4 wheels;Cane - single point;Bedside commode;Shower seat          Prior Functioning/Environment Level of Independence: Independent with assistive device(s)        Comments: pt was bathing, dressing, transferring and walking on her own with assist for stairs and family does housework    OT Diagnosis: Generalized weakness;Other (comment) (expressive language deficits)          End of Session Equipment Utilized During Treatment: Gait belt;Rolling walker Nurse Communication: Other (comment) (HHOT needs, pt dressed in case of d/c)  Activity Tolerance: Patient tolerated treatment  well Patient left: in chair;with call bell/phone within reach;with family/visitor present   Time: 3005-1102 OT Time Calculation (min): 56 min Charges:  OT General Charges $OT Visit: 1 Procedure OT Evaluation $Initial OT Evaluation Tier I: 1 Procedure OT Treatments $Self Care/Home Management : 38-52 mins  Juluis Rainier 03/17/2014, 11:25 AM   Cyndie Chime, OTR/L Occupational Therapist [phone] (pager)

## 2014-03-17 NOTE — Plan of Care (Signed)
Problem: Progression Outcomes Goal: Progressive activity as tolerated Outcome: Completed/Met Date Met:  03/17/14

## 2014-03-17 NOTE — Care Management Note (Signed)
    Page 1 of 2   03/17/2014     3:54:37 PM CARE MANAGEMENT NOTE 03/17/2014  Patient:  Madison Berg, Madison Berg   Account Number:  [id number]  Date Initiated:  03/17/2014  Documentation initiated by:  [facility]  Subjective/Objective Assessment:   adm: Outpatient CT head showed subdural hematoma.     Action/Plan:   discharge planning   Anticipated DC Date:  03/17/2014   Anticipated DC Plan:  [place]  CM consult      [facility] Choice  HOME HEALTH   Choice offered to / List presented to:  C-1 Patient   DME arranged  Vassie Moselle      DME agency  [place] arranged  [place].   Status of service:  Completed, signed off Medicare Important Message given?   (If response is "NO", the following Medicare IM given date fields will be blank) Date Medicare IM given:   Medicare IM given by:   Date Additional Medicare IM given:   Additional Medicare IM given by:    Discharge Disposition:  [place]  Per UR Regulation:    If discussed at Long Length of Stay Meetings, dates discussed:    Comments:  03/17/14 13:03 CM met with pt in room to offer choice of home health agency.  Pt chooses [facility] to render HHPT/OT/aide. Address and contact information verified with pt. Cm called [facility] DME rep, Jeneen Rinks to please deliver a rolling walker to room prior to discharge.  Referral called to [facility] rep, Jamie.  No other CM needs were communicated.  Mariane Masters, BSN, CM [phone].

## 2014-03-22 ENCOUNTER — Ambulatory Visit: Payer: Medicare Other | Admitting: Pharmacist Clinician (PhC)/ Clinical Pharmacy Specialist

## 2014-04-03 ENCOUNTER — Encounter: Payer: Self-pay | Admitting: Neurology

## 2014-04-03 ENCOUNTER — Ambulatory Visit (INDEPENDENT_AMBULATORY_CARE_PROVIDER_SITE_OTHER): Payer: Medicare Other | Admitting: Neurology

## 2014-04-03 VITALS — BP 149/65 | HR 66 | Ht 60.0 in | Wt 111.4 lb

## 2014-04-03 DIAGNOSIS — R269 Unspecified abnormalities of gait and mobility: Secondary | ICD-10-CM

## 2014-04-03 DIAGNOSIS — G441 Vascular headache, not elsewhere classified: Secondary | ICD-10-CM

## 2014-04-03 DIAGNOSIS — R519 Headache, unspecified: Secondary | ICD-10-CM | POA: Insufficient documentation

## 2014-04-03 DIAGNOSIS — I6200 Nontraumatic subdural hemorrhage, unspecified: Secondary | ICD-10-CM

## 2014-04-03 DIAGNOSIS — S065X9A Traumatic subdural hemorrhage with loss of consciousness of unspecified duration, initial encounter: Secondary | ICD-10-CM

## 2014-04-03 DIAGNOSIS — I69320 Aphasia following cerebral infarction: Secondary | ICD-10-CM

## 2014-04-03 DIAGNOSIS — R51 Headache: Secondary | ICD-10-CM

## 2014-04-03 DIAGNOSIS — S065XAA Traumatic subdural hemorrhage with loss of consciousness status unknown, initial encounter: Secondary | ICD-10-CM

## 2014-04-03 HISTORY — DX: Aphasia following cerebral infarction: I69.320

## 2014-04-03 HISTORY — DX: Unspecified abnormalities of gait and mobility: R26.9

## 2014-04-03 NOTE — Patient Instructions (Signed)
Subdural Hematoma  A subdural hematoma is a collection of blood between the brain and its tough outermost membrane covering (the dura).  Blood clots that form in this area push down on the brain and cause irritation. A subdural hematoma may cause parts of the brain to stop working and eventually cause death.   CAUSES  A subdural hematoma is caused by bleeding from a ruptured blood vessel (hemorrhage). The bleeding results from trauma to the head, such as from a fall or motor vehicle accident.  There are two types of subdural hemorrhages:  · Acute. This type develops shortly after a serious blow to the head and causes blood to collect very quickly. If not diagnosed and treated promptly, severe brain injury or death can occur.  · Chronic. This is when bleeding develops more slowly, over weeks or months.  RISK FACTORS  People at risk for subdural hematoma include older persons, infants, and alcoholics.  SYMPTOMS  An acute subdural hemorrhage develops over minutes to hours. Symptoms can include:  · Temporary loss of consciousness.  · Weakness of arms or legs on one side of the body.  · Changes in vision or speech.  · A severe headache.  · Seizures.  · Nausea and vomiting.  · Increased sleepiness.  A chronic subdural hemorrhage develops over weeks to months. Symptoms may develop slowly and produce less noticeable problems or changes. Symptoms include:  · A mild headache.  · A change in personality.  · Loss of balance or difficulty walking.  · Weakness, numbness, or tingling in the arms or legs.  · Nausea or vomiting.  · Memory loss.  · Double vision.  · Increased sleepiness.  DIAGNOSIS  Your health care provider will perform a thorough physical and neurological exam. A CT scan or MRI may also be done. If there is blood on the scan, its color will help your health care provider determine how long the hemorrhage has been there.  TREATMENT  If the cause is an acute subdural hemorrhage, immediate treatment is needed. In many  cases an emergency surgery is performed to drain accumulated blood or to remove the blood clot. Sometimes steroid or diuretic medicines or controlled breathing through a ventilator is needed to decrease pressure in the brain. This is especially true if there is any swelling of the brain.  If the cause is a chronic subdural hemorrhage, treatment depends on a variety of factors. Sometimes no treatment is needed. If the subdural hematoma is small and causes minimal or no symptoms, you may be treated with bed rest, medicines, and observation. If the hemorrhage is large or if you have neurological symptoms, an emergency surgery is usually needed to remove the blood clot.  People who develop a subdural hemorrhage are at risk of developing seizures, even after the subdural hematoma has been treated. You may be prescribed an anti-seizure (anticonvulsant) medicine for a year or longer.  HOME CARE INSTRUCTIONS  · Only take medicines as directed by your health care provider.  · Rest if directed by your health care provider.  · Keep all follow-up appointments with your health care provider.  · If you play a contact sport such as football, hockey or soccer and you experienced a significant head injury, allow enough time for healing (up to 15 days) before you start playing again. A repeated injury that occurs during this fragile repair period is likely to result in hemorrhage. This is called the second impact syndrome.  SEEK IMMEDIATE MEDICAL CARE IF:  ·   You fall or experience minor trauma to your head and you are taking blood thinners. If you are on any blood thinners even a very small injury can cause a subdural hematoma. You should not hesitate to seek medical attention regardless of how minor you think your symptoms are.  · You experience a head injury and have:  ¨ Drowsiness or a decrease in alertness.  ¨ Confusion or forgetfulness.  ¨ Slurred speech.  ¨ Irrational or aggressive behavior.  ¨ Numbness or paralysis in any part  of the body.  ¨ A feeling of being sick to your stomach (nauseous) or you throw up (vomit).  ¨ Difficulty walking or poor coordination.  ¨ Double vision.  ¨ Seizures.  ¨ A bleeding disorder.  ¨ A history of heavy alcohol use.  ¨ Clear fluid draining from your nose or ears.  ¨ Personality changes.  ¨ Difficulty thinking.  ¨ Worsening symptoms.  MAKE SURE YOU:  · Understand these instructions.  · Will watch your condition.  · Will get help right away if you are not doing well or get worse.  FOR MORE INFORMATION  National Institute of Neurological Disorders and Stroke: www.ninds.nih.gov  American Association of Neurological Surgeons: www.neurosurgerytoday.org  American Academy of Neurology (AAN): www.aan.com  Brain Injury Association of America: www.biausa.org  Document Released: 03/06/2004 Document Revised: 02/07/2013 Document Reviewed: 10/20/2012  ExitCare® Patient Information ©2015 [employer]. This information is not intended to replace advice given to you by your health care provider. Make sure you discuss any questions you have with your health care provider.

## 2014-04-03 NOTE — Progress Notes (Signed)
Reason for visit: Headache  Madison Berg is a 80 y.o. female  History of present illness:  Madison Berg is an 80 year old white female with a history of cerebrovascular disease. In 2010 she sustained a left occipital and left insular cortex stroke that left her with a residual aphasia. The patient has also had a gait disorder, and she has been on anticoagulation with Madison Berg for atrial fibrillation. The patient has had multiple falls over the last several years, but she has never used a walker for ambulation. The patient fell around 03/14/2014. The patient had 2 days of severe headaches in the left temporal area lasting about 5 hours. The patient eventually went to the emergency room, and she was found to have acute on chronic subdural hematoma on the left frontotemporal region. The subdural was not large, and she was watched conservatively. The patient was taken off of the Madison Berg. She was seen yesterday by Dr. Christella Noa, and the patient indicates that no further evaluation was set up through his office. The patient comes to this office for further management. The patient is now using a walker, and she is in physical therapy. She has not had any further falls. She denies any numbness or weakness on the face, arms, or legs. The patient does have residual aphasia from her stroke in 2010, and no change in his speech otherwise. The patient denies any visual field changes, or problems with swallowing. She does have some stress incontinence of the bladder. She has not had any further headaches. She comes to this office for further evaluation.  Past Medical History  Diagnosis Date  . PAF (paroxysmal atrial fibrillation)   . CVA (cerebral infarction) 04/2009    Madison Berg & posterior cerebral - excellent recovery, mild expressive aphasa  . Hypertension   . History of nuclear stress test 06/2009    dipyridamole; negative, normal pattern of perfusion   . Gait disorder 04/03/2014  . Aphasia as late effect  of stroke 04/03/2014    Past Surgical History  Procedure Laterality Date  . Cataract extraction Bilateral 2013  . Replacement total knee Right 2004  . Appendectomy    . Abdominal hysterectomy  1975  . Instestinal surgery  1987    r/t blockage  . Nose surgery  1985    r/t deviated septum  . Transthoracic echocardiogram  09/19/2012    EF 55-60% with normal systolic function; mod AR; MVP with mild-mod regurg; LA severely dilated; RV systolic pressure increased; mod dilated RA; RV systolic pressure increased - mild pulm HTN     Family History  Problem Relation Age of Onset  . Lymphoma Mother   . Aneurysm Other     grandmother, uncles, cousins  . Tuberculosis Father     Social history:  reports that she has never smoked. She has never used smokeless tobacco. She reports that she does not drink alcohol or use illicit drugs.  Medications:  Current Outpatient Prescriptions on File Prior to Visit  Medication Sig Dispense Refill  . acetaminophen (TYLENOL) 500 MG tablet Take 500 mg by mouth every 6 (six) hours as needed for mild pain.    Marland Kitchen alendronate (FOSAMAX) 70 MG tablet Take 70 mg by mouth every 7 (seven) days. Take with a full glass of water on an empty stomach.    Marland Kitchen amiodarone (PACERONE) 200 MG tablet TAKE 1 TABLET BY MOUTH EVERY DAY 30 tablet 7  . amLODipine (NORVASC) 10 MG tablet Take 10 mg by mouth daily.    Marland Kitchen  levothyroxine (SYNTHROID) 25 MCG tablet Take 25 mcg by mouth daily before breakfast.    . metoprolol succinate (TOPROL-XL) 25 MG 24 hr tablet Take 2 tablets (50 mg total) by mouth daily. Take with or immediately following a meal. 30 tablet 0   No current facility-administered medications on file prior to visit.      Allergies  Allergen Reactions  . Atenolol Other (See Comments)    Hair loss, thin nails, itching    ROS:  Out of a complete 14 system review of symptoms, the patient complains only of the following symptoms, and all other reviewed systems are  negative.  Hearing loss, ringing in the ears, runny nose Cough Constipation Environmental allergies Incontinence of the bladder Joint pain Headache, speech difficulty, tremors  Blood pressure 149/65, pulse 66, height 5' (1.524 m), weight 111 lb 6.4 oz (50.531 kg).  Physical Exam  General: The patient is alert and cooperative at the time of the examination.  Eyes: Pupils are equal, round, and reactive to light. Discs are flat bilaterally.  Neck: The neck is supple, no carotid bruits are noted.  Respiratory: The respiratory examination is clear.  Cardiovascular: The cardiovascular examination reveals a regular rate and rhythm, no obvious murmurs or rubs are noted.  Skin: Extremities are with 1+ edema at the ankles bilaterally.  Neurologic Exam  Mental status: The patient is alert and oriented x 3 at the time of the examination. The patient has apparent normal recent and remote memory, with an apparently normal attention span and concentration ability.  Cranial nerves: Facial symmetry is present. There is good sensation of the face to pinprick and soft touch bilaterally. The strength of the facial muscles and the muscles to head turning and shoulder shrug are normal bilaterally. Speech is aphasic, with word finding problems. Extraocular movements are full. Visual fields are full. The tongue is midline, and the patient has symmetric elevation of the soft palate. No obvious hearing deficits are noted.  Motor: The motor testing reveals 5 over 5 strength of all 4 extremities. Good symmetric motor tone is noted throughout.  Sensory: Sensory testing is intact to pinprick, soft touch, vibration sensation, and position sense on all 4 extremities. No evidence of extinction is noted.  Coordination: Cerebellar testing reveals good finger-nose-finger and heel-to-shin bilaterally. An intention tremor seen bilaterally, right greater than left.  Gait and station: Gait is slightly wide-based. The  patient uses a cane for ambulation. Tandem gait was not attempted. Romberg is negative. No drift is seen.  Reflexes: Deep tendon reflexes are symmetric, but are depressed bilaterally. Toes are downgoing bilaterally.    CT head 03/17/14:  IMPRESSION: Unchanged left subdural hematomas of mixed ages. No new hemorrhage, midline shift or hydrocephalus.  Atrophy, chronic small-vessel white matter ischemic changes and remote left MCA infarct.   Assessment/Plan:  1. History of cerebrovascular disease, residual aphasia  2. Gait disorder  3. Left frontotemporal subdural hematoma  The patient has had multiple falls on anticoagulant therapy, with a chronic subdural hematoma with more recent acute or subacute bleeding. The subdural hematoma is relatively small, and likely will not require surgical therapy. I will recheck a CT brain scan in 3 weeks, and again in 3 months. If the patient has increased headache, she is to contact our office. She may be able to go on low-dose aspirin 90 days out if the CT scan of the head appears to be stable. Secondary to fall risk, the patient should not be anticoagulated again.  Bonner Puna  Jannifer Franklin MD 04/03/2014 7:50 PM  [facility] [address] [place], [place] [address]  Phone [phone] Fax [phone]

## 2014-04-27 ENCOUNTER — Telehealth: Payer: Self-pay | Admitting: Neurology

## 2014-04-27 DIAGNOSIS — S065X9A Traumatic subdural hemorrhage with loss of consciousness of unspecified duration, initial encounter: Secondary | ICD-10-CM

## 2014-04-27 DIAGNOSIS — S065XAA Traumatic subdural hemorrhage with loss of consciousness status unknown, initial encounter: Secondary | ICD-10-CM

## 2014-04-27 NOTE — Telephone Encounter (Signed)
I called the patient. We will set up a repeat CT of the brain to re-evaluate the SDH.

## 2014-05-02 ENCOUNTER — Ambulatory Visit
Admission: RE | Admit: 2014-05-02 | Discharge: 2014-05-02 | Disposition: A | Discharge status: Home / Self Care | Payer: Medicare Other | Source: Ambulatory Visit | Attending: Neurology | Admitting: Neurology

## 2014-05-02 DIAGNOSIS — S065XAA Traumatic subdural hemorrhage with loss of consciousness status unknown, initial encounter: Secondary | ICD-10-CM

## 2014-05-02 DIAGNOSIS — I6200 Nontraumatic subdural hemorrhage, unspecified: Secondary | ICD-10-CM

## 2014-05-02 DIAGNOSIS — S065X9A Traumatic subdural hemorrhage with loss of consciousness of unspecified duration, initial encounter: Secondary | ICD-10-CM

## 2014-05-06 ENCOUNTER — Telehealth: Payer: Self-pay | Admitting: Neurology

## 2014-05-06 NOTE — Telephone Encounter (Signed)
   I called the patient. The CT scan of the head shows good improvement in the subdural hematoma, okay to go back on low-dose aspirin.   CT head 05/02/2014:  IMPRESSION:  Abnormal CT head (without) demonstrating: 1. Small residual foci of sub-acute to chronic subdural hematoma in the left frontal region, measuring 4-56mm in thickness.  2. Left parietal encephalomalacia and gliosis from chronic ischemic infarction.  3. Compared to CT on 03/17/14, there has been significant improvement in left frontal subdural hematoma, with only a small residual foci that remain as described above.

## 2014-05-28 ENCOUNTER — Ambulatory Visit: Payer: Self-pay | Admitting: Pharmacist Clinician (PhC)/ Clinical Pharmacy Specialist

## 2014-05-28 DIAGNOSIS — I480 Paroxysmal atrial fibrillation: Secondary | ICD-10-CM

## 2014-06-03 ENCOUNTER — Telehealth: Payer: Self-pay | Admitting: *Deleted

## 2014-06-03 NOTE — Telephone Encounter (Signed)
I called the patient, the patient is to go back on aspirin only at 81 mg daily.

## 2014-06-03 NOTE — Telephone Encounter (Signed)
Patient calling wanting to know if she should start back on Plavix or Aspirin. Per the last note on 05/06/14 it states low dose Aspirin. Please advise.

## 2014-06-04 ENCOUNTER — Telehealth: Payer: Self-pay | Admitting: *Deleted

## 2014-06-04 NOTE — Telephone Encounter (Signed)
Patient was last seen 04-03-14 and wanting to be seen again. Patient states that it is not an emergency but wants to be seen. She spoke to someone else and they told her that the physician next available is not till March she thought that was too far away. Butch Penny please call patient to schedule.

## 2014-06-05 NOTE — Telephone Encounter (Signed)
Spoke to patient and she would like to know when she needs to come back and see the doctor.  She seems a bit confused, but said he PCP said she needs to follow up with neurology.     She also relayed that her legs are getting swollen, warm and burning.  I told her to immediately call her PCP and let them know what is going on with her legs.  She verbalized understanding and will call right away.

## 2014-06-05 NOTE — Telephone Encounter (Signed)
I called the patient, she is doing well on aspirin at this point. I'll get a revisit set up sometime in the next 2 months or so.

## 2014-06-10 NOTE — Telephone Encounter (Signed)
Spoke to patient and she is scheduled for 08-05-14 for follow up appointment per Dr. Jannifer Franklin.

## 2014-07-26 ENCOUNTER — Other Ambulatory Visit: Payer: Self-pay | Admitting: Internal Medicine

## 2014-07-26 DIAGNOSIS — R6889 Other general symptoms and signs: Secondary | ICD-10-CM

## 2014-07-26 DIAGNOSIS — R209 Unspecified disturbances of skin sensation: Secondary | ICD-10-CM

## 2014-08-01 ENCOUNTER — Ambulatory Visit
Admission: RE | Admit: 2014-08-01 | Discharge: 2014-08-01 | Disposition: A | Discharge status: Home / Self Care | Payer: Medicare Other | Source: Ambulatory Visit | Attending: Internal Medicine | Admitting: Internal Medicine

## 2014-08-01 ENCOUNTER — Other Ambulatory Visit: Payer: Self-pay

## 2014-08-01 DIAGNOSIS — R6889 Other general symptoms and signs: Secondary | ICD-10-CM

## 2014-08-01 DIAGNOSIS — R209 Unspecified disturbances of skin sensation: Secondary | ICD-10-CM

## 2014-08-03 ENCOUNTER — Other Ambulatory Visit: Payer: Self-pay | Admitting: Internal Medicine

## 2014-08-05 ENCOUNTER — Encounter: Payer: Self-pay | Admitting: Neurology

## 2014-08-05 ENCOUNTER — Ambulatory Visit (INDEPENDENT_AMBULATORY_CARE_PROVIDER_SITE_OTHER): Payer: Medicare Other | Admitting: Neurology

## 2014-08-05 VITALS — BP 136/68 | HR 73 | Ht 60.0 in | Wt 117.2 lb

## 2014-08-05 DIAGNOSIS — R202 Paresthesia of skin: Secondary | ICD-10-CM | POA: Diagnosis not present

## 2014-08-05 DIAGNOSIS — E538 Deficiency of other specified B group vitamins: Secondary | ICD-10-CM | POA: Diagnosis not present

## 2014-08-05 DIAGNOSIS — I6201 Nontraumatic acute subdural hemorrhage: Secondary | ICD-10-CM

## 2014-08-05 DIAGNOSIS — I69320 Aphasia following cerebral infarction: Secondary | ICD-10-CM | POA: Diagnosis not present

## 2014-08-05 DIAGNOSIS — R269 Unspecified abnormalities of gait and mobility: Secondary | ICD-10-CM

## 2014-08-05 DIAGNOSIS — I6203 Nontraumatic chronic subdural hemorrhage: Secondary | ICD-10-CM

## 2014-08-05 NOTE — Telephone Encounter (Signed)
Rx(s) sent to pharmacy electronically.  

## 2014-08-05 NOTE — Patient Instructions (Signed)

## 2014-08-05 NOTE — Progress Notes (Signed)
Reason for visit: Gait disorder  Madison Berg is an 81 y.o. female  History of present illness:  Madison Berg is an 81 year old right-handed white female with a history of cerebrovascular disease with resultant issues with gait instability and aphasia. The patient uses a walker for ambulation. She indicates that she has had one fall about 6 weeks ago, but fortunately she did not sustain any significant injury. The patient has developed some sensation alteration in the legs, she feels a burning sensation in the legs that began 4-6 weeks ago. The patient believes that the walking has worsened slightly with this. The patient does report some occasional back pain without radiation down the legs. She has some occasional episodes of urinary incontinence. This occurs mainly at night. The patient has not had any change in her aphasia. She current is on low-dose aspirin. There are no plans to anticoagulate the patient given the multiple falls and associated subdural hematoma. The patient has had an arterial Doppler study done on the legs that was unremarkable.  Past Medical History  Diagnosis Date  . PAF (paroxysmal atrial fibrillation)   . CVA (cerebral infarction) 04/2009    LMCA & posterior cerebral - excellent recovery, mild expressive aphasa  . Hypertension   . History of nuclear stress test 06/2009    dipyridamole; negative, normal pattern of perfusion   . Gait disorder 04/03/2014  . Aphasia as late effect of stroke 04/03/2014  . Osteopenia   . Chronic kidney disease   . OA (osteoarthritis)   . Anxiety   . Vitamin D deficiency   . Valvular heart disease   . Multiple lung nodules   . Diverticulitis   . Nephrolithiasis   . Cervical spondylosis     Past Surgical History  Procedure Laterality Date  . Cataract extraction Bilateral 2013  . Replacement total knee Right 2004  . Appendectomy    . Abdominal hysterectomy  1975  . Instestinal surgery  1987    r/t blockage  . Nose  surgery  1985    r/t deviated septum  . Transthoracic echocardiogram  09/19/2012    EF 55-60% with normal systolic function; mod AR; MVP with mild-mod regurg; LA severely dilated; RV systolic pressure increased; mod dilated RA; RV systolic pressure increased - mild pulm HTN     Family History  Problem Relation Age of Onset  . Lymphoma Mother   . Aneurysm Other     grandmother, uncles, cousins  . Tuberculosis Father     Social history:  reports that she has never smoked. She has never used smokeless tobacco. She reports that she does not drink alcohol or use illicit drugs.    Allergies  Allergen Reactions  . Amitriptyline Hcl Itching  . Atenolol Other (See Comments)    Hair loss, thin nails, itching  . Azithromycin Other (See Comments)    Mouth sores; sore throat  . Cefuroxime   . Daypro [Oxaprozin] Other (See Comments)    dyspepsia  . Doxycycline Other (See Comments)    Mouth sores; sore throat  . Famvir [Famciclovir]   . Hyzaar [Losartan Potassium-Hctz] Other (See Comments)    Headache; tight face  . Iodine Other (See Comments)    Involuntary muscle contractions  . Metoprolol Tartrate Itching  . Relafen [Nabumetone] Other (See Comments)    Mouth sores  . Sterapred [Prednisone] Other (See Comments)    weakness    Medications:  Prior to Admission medications   Medication Sig Start Date  End Date Taking? Authorizing Provider  acetaminophen (TYLENOL) 500 MG tablet Take 500 mg by mouth every 6 (six) hours as needed for mild pain.   Yes Historical Provider, MD  alendronate (FOSAMAX) 70 MG tablet Take 70 mg by mouth every 7 (seven) days. Take with a full glass of water on an empty stomach.   Yes Historical Provider, MD  amiodarone (PACERONE) 200 MG tablet TAKE 1 TABLET BY MOUTH EVERY DAY   Yes Pixie Casino, MD  amLODipine (NORVASC) 10 MG tablet Take 10 mg by mouth daily.   Yes Historical Provider, MD  Calcium Carb-Cholecalciferol (CALCIUM-VITAMIN D) 500-400 MG-UNIT TABS  Take 1 tablet by mouth daily.   Yes Historical Provider, MD  cetirizine (ZYRTEC) 10 MG tablet Take 10 mg by mouth daily.   Yes Historical Provider, MD  fluticasone (FLONASE) 50 MCG/ACT nasal spray Place 2 sprays into both nostrils daily.   Yes Historical Provider, MD  hydrocortisone 2.5 % lotion Apply topically 2 (two) times daily.   Yes Historical Provider, MD  levothyroxine (SYNTHROID) 25 MCG tablet Take 25 mcg by mouth daily before breakfast.   Yes Historical Provider, MD  metoprolol succinate (TOPROL-XL) 50 MG 24 hr tablet Take 50 mg by mouth daily. 07/04/14  Yes Historical Provider, MD    ROS:  Out of a complete 14 system review of symptoms, the patient complains only of the following symptoms, and all other reviewed systems are negative.  Hearing loss, ringing in the ears, runny nose, drooling Cough Rectal bleeding, constipation Food allergies Bruising easily  Blood pressure 136/68, pulse 73, height 5' (1.524 m), weight 117 lb 3.2 oz (53.162 kg).  Physical Exam  General: The patient is alert and cooperative at the time of the examination.  Skin: 2+ edema below the knees is noted bilaterally.   Neurologic Exam  Mental status: The patient is alert and oriented x 3 at the time of the examination. The patient has apparent normal recent and remote memory, with an apparently normal attention span and concentration ability.   Cranial nerves: Facial symmetry is present. Speech is aphasic. Extraocular movements are full. Visual fields are full.  Motor: The patient has good strength in all 4 extremities.  Sensory examination: Soft touch sensation is symmetric on the face, arms, and legs. There is no definite stocking pattern pinprick sensory deficit in the legs low the knees.  Coordination: The patient has good finger-nose-finger and heel-to-shin bilaterally.  Gait and station: The patient has a wide-based gait, unsteady. The patient does quite well, however using the walker. The  patient has good turns and good stability with the walker. Tandem gait was not attempted.  Reflexes: Deep tendon reflexes are symmetric, but are depressed.   Assessment/Plan:  1. Gait disorder  2. Atrial fibrillation  3. History of subdural hematoma  4. Sensory alteration, bilateral lower extremities, new onset  The patient is having new symptoms of sensation alteration in the legs. The patient will be evaluated with blood work today, and she will have nerve conduction studies on both legs and one arm, EMG on one leg. The patient has already had an arterial Doppler study on both legs that were unremarkable. She will follow-up for the EMG evaluation. The patient is on amiodarone which may cause a peripheral neuropathy.  Jill Alexanders MD 08/05/2014 7:16 PM  [facility] [address] [place], [place] [address]  Phone [phone] Fax [phone]

## 2014-08-06 ENCOUNTER — Other Ambulatory Visit: Payer: Self-pay | Admitting: Internal Medicine

## 2014-08-07 ENCOUNTER — Telehealth: Payer: Self-pay

## 2014-08-07 LAB — MULTIPLE MYELOMA PANEL, SERUM
ALBUMIN/GLOB SERPL: 1.6 (ref 0.7–2.0)
Albumin SerPl Elph-Mcnc: 4.2 g/dL (ref 3.2–5.6)
Alpha 1: 0.3 g/dL (ref 0.1–0.4)
Alpha2 Glob SerPl Elph-Mcnc: 0.7 g/dL (ref 0.4–1.2)
B-Globulin SerPl Elph-Mcnc: 1.1 g/dL (ref 0.6–1.3)
GAMMA GLOB SERPL ELPH-MCNC: 0.7 g/dL (ref 0.5–1.6)
Globulin, Total: 2.8 g/dL (ref 2.0–4.5)
IGA/IMMUNOGLOBULIN A, SERUM: 167 mg/dL (ref 64–422)
IGG (IMMUNOGLOBIN G), SERUM: 762 mg/dL (ref 700–1600)
IGM (IMMUNOGLOBULIN M), SRM: 41 mg/dL (ref 26–217)
TOTAL PROTEIN: 7 g/dL (ref 6.0–8.5)

## 2014-08-07 LAB — ANGIOTENSIN CONVERTING ENZYME: ANGIO CONVERT ENZYME: 41 U/L (ref 14–82)

## 2014-08-07 LAB — ANA W/REFLEX: Anti Nuclear Antibody(ANA): NEGATIVE

## 2014-08-07 LAB — COPPER, SERUM: Copper: 147 ug/dL (ref 72–166)

## 2014-08-07 LAB — VITAMIN B12: Vitamin B-12: 696 pg/mL (ref 211–946)

## 2014-08-07 LAB — B. BURGDORFI ANTIBODIES: Lyme IgG/IgM Ab: <0.91 {ISR} (ref 0.00–0.90)

## 2014-08-07 NOTE — Telephone Encounter (Signed)
I spoke to the patient and relayed that the results were normal.

## 2014-08-07 NOTE — Telephone Encounter (Signed)
-----   Message from Kathrynn Ducking, MD sent at 08/07/2014  1:01 PM EDT -----  The blood work results are unremarkable. Please call the patient.  ----- Message -----    From: [facility] Lab Results In Interface    Sent: 08/06/2014   7:48 AM      To: Kathrynn Ducking, MD

## 2014-08-07 NOTE — Telephone Encounter (Signed)
Amiodarone refilled on 08/05/14

## 2014-08-21 ENCOUNTER — Ambulatory Visit (INDEPENDENT_AMBULATORY_CARE_PROVIDER_SITE_OTHER): Payer: Self-pay | Admitting: Neurology

## 2014-08-21 ENCOUNTER — Ambulatory Visit (INDEPENDENT_AMBULATORY_CARE_PROVIDER_SITE_OTHER): Payer: Medicare Other | Admitting: Neurology

## 2014-08-21 ENCOUNTER — Encounter: Payer: Self-pay | Admitting: Neurology

## 2014-08-21 ENCOUNTER — Telehealth: Payer: Self-pay | Admitting: Neurology

## 2014-08-21 VITALS — BP 141/66 | HR 57 | Ht 59.0 in | Wt 115.3 lb

## 2014-08-21 DIAGNOSIS — R209 Unspecified disturbances of skin sensation: Principal | ICD-10-CM

## 2014-08-21 DIAGNOSIS — I69320 Aphasia following cerebral infarction: Secondary | ICD-10-CM

## 2014-08-21 DIAGNOSIS — I6203 Nontraumatic chronic subdural hemorrhage: Secondary | ICD-10-CM

## 2014-08-21 DIAGNOSIS — R269 Unspecified abnormalities of gait and mobility: Secondary | ICD-10-CM

## 2014-08-21 DIAGNOSIS — IMO0001 Reserved for inherently not codable concepts without codable children: Secondary | ICD-10-CM

## 2014-08-21 DIAGNOSIS — R202 Paresthesia of skin: Secondary | ICD-10-CM

## 2014-08-21 DIAGNOSIS — I6201 Nontraumatic acute subdural hemorrhage: Secondary | ICD-10-CM

## 2014-08-21 NOTE — Progress Notes (Signed)
Madison Berg is an 81 year old patient with a history of gait instability, and cerebrovascular disease. The patient has noted sensory alterations in the feet and legs over the last 2 months. She is being evaluated for possible peripheral neuropathy.  Nerve conduction studies done on both lower extremity is in the left upper extremity shows no evidence of a peripheral neuropathy, some dysfunction of the right peroneal nerve was noted. EMG evaluation of the right lower extremity showed some chronic stable denervation the gastrocnemius muscle, possibility of an overlying S1 radiculopathy was entertained.  The patient will be followed over time, the discomfort in the legs may need to be treated in the future.

## 2014-08-21 NOTE — Procedures (Signed)
     HISTORY:  Madison Berg is an 81 year old patient with a 6-8 week history of sensory alteration in the lower extremities, discomfort in the feet. The patient is being evaluated for possible peripheral neuropathy. The patient has a chronic gait instability, history of cerebrovascular disease.  NERVE CONDUCTION STUDIES:  Nerve conduction studies were performed on the left upper extremity. The distal motor latencies and motor amplitudes for the median and ulnar nerves were within normal limits. The F wave latencies and nerve conduction velocities for these nerves were also normal. The sensory latencies for the median and ulnar nerves were normal.  Nerve conduction studies done on both lower extremities show normal distal motor latencies for the peroneal and posterior tibial nerves with a low motor amplitude for the right peroneal nerve, normal on the left. The motor amplitudes for the posterior tibial nerves were normal bilaterally. The nerve conduction velocities for the peroneal and posterior tibial nerves were normal bilaterally. The F wave latencies for the peroneal nerves were prolonged bilaterally, normal for the posterior tibial nerves bilaterally. The peroneal sensory latencies were normal on the left, absent on the right.  EMG STUDIES:  EMG study was performed on the right lower extremity:  The tibialis anterior muscle reveals 2 to 4K motor units with full recruitment. No fibrillations or positive waves were seen. The peroneus tertius muscle reveals 2 to 4K motor units with slightly decreased recruitment. No fibrillations or positive waves were seen. The medial gastrocnemius muscle reveals 2 to 7K motor units with decreased recruitment. No fibrillations or positive waves were seen. The vastus lateralis muscle reveals 2 to 4K motor units with full recruitment. No fibrillations or positive waves were seen. The iliopsoas muscle reveals 2 to 4K motor units with full recruitment. No  fibrillations or positive waves were seen. The biceps femoris muscle (long head) reveals 2 to 4K motor units with full recruitment. No fibrillations or positive waves were seen. The lumbosacral paraspinal muscles were tested at 3 levels, and revealed no abnormalities of insertional activity at all 3 levels tested. There was good relaxation.   IMPRESSION:  Nerve conduction studies done on the left upper extremity and both lower extremities shows findings consistent with a right peroneal nerve dysfunction, possibly at the fibular head. There is no evidence of an overlying peripheral neuropathy. EMG evaluation of the right lower extremity shows chronic stable denervation in the S1 nerve root distribution. Otherwise, no significant abnormalities were seen.  Jill Alexanders MD 08/21/2014 4:51 PM  [facility] [address] [place], [place] [address]  Phone [phone] Fax [phone]

## 2014-08-21 NOTE — Progress Notes (Signed)
Please refer to EMG and NCV procedure note. 

## 2014-08-21 NOTE — Telephone Encounter (Signed)
I tried to call the patient with the results of the EMG and nerve conduction study, I got no answer, I will call back tomorrow.

## 2014-08-22 NOTE — Telephone Encounter (Signed)
I called the patient. The nerve conduction studies did not show definite peripheral neuropathy. EMG question a chronic stable S1 radiculopathy. The patient is having sensory alteration both legs. I may consider a CT scan of the low back. If this shows significant degenerative changes or spinal stenosis, the patient may be a candidate for epidural steroid injections. The patient is amenable to this.

## 2014-09-05 ENCOUNTER — Encounter: Payer: Self-pay | Admitting: Internal Medicine

## 2014-09-05 ENCOUNTER — Ambulatory Visit (INDEPENDENT_AMBULATORY_CARE_PROVIDER_SITE_OTHER): Payer: Medicare Other | Admitting: Internal Medicine

## 2014-09-05 VITALS — BP 138/66 | HR 63 | Ht 60.0 in | Wt 110.0 lb

## 2014-09-05 DIAGNOSIS — I6200 Nontraumatic subdural hemorrhage, unspecified: Secondary | ICD-10-CM

## 2014-09-05 DIAGNOSIS — I480 Paroxysmal atrial fibrillation: Secondary | ICD-10-CM

## 2014-09-05 DIAGNOSIS — S065XAA Traumatic subdural hemorrhage with loss of consciousness status unknown, initial encounter: Secondary | ICD-10-CM

## 2014-09-05 DIAGNOSIS — I639 Cerebral infarction, unspecified: Secondary | ICD-10-CM | POA: Diagnosis not present

## 2014-09-05 DIAGNOSIS — S065X9A Traumatic subdural hemorrhage with loss of consciousness of unspecified duration, initial encounter: Secondary | ICD-10-CM

## 2014-09-05 NOTE — Progress Notes (Signed)
OFFICE NOTE  Chief Complaint:  Hospital follow-up  Primary Care Physician: Madison Pel, MD  HPI:  Madison Berg is a pleasant 81 year old female followed for years by Madison Berg with a history of paroxysmal atrial fibrillation. She had a stroke in [id number] with an embolic event to her left middle cerebral artery and posterior cerebral artery. She recovered fairly well except she has some mild expressive aphasia. Chest is a history of aortic insufficiency which has been moderate and was recently stable on an echocardiogram in May of 2014. Her ejection fraction is preserved and she said no ischemia. She had a Myoview stress test in February 2011 which was negative. She is on warfarin therapy and her INRs have been therapeutic and stable. She has no complaints of chest pain or shortness of breath. She maintains on amiodarone for rhythm control with her atrial fibrillation. She had recent thyroid function which was in normal limits, but I do not see recent pulmonary function testing. She will need repeat of this testing at her next office visit. Her other main complaint today is problems with bladder incontinence. She previously had seen Dr. Elder Berg and he had advised against medications for incontinence, but her problems have gotten worse and she may be a good candidate for this medication. I understand that she may have had a bladder surgery in the past.  Madison Berg has no new complaints today. She denies any worsening shortness of breath or chest pain. She is again concerned about hair loss, however I do not appreciate any significant worsening of the thinning of her hair. She is asked whether it is okay for her to use biotin supplements. I do not have a problem with that. She has had a couple of more falls but is resistant to using a walker due to decreased mobility and independence. She is since stopped driving.  I saw Madison Berg back in the office today. Unfortunately since  I last saw her she continued to have gait instability and had numerous falls. Ultimately she developed a subdural hematoma and was felt to be a very poor candidate at that point for anticoagulation with warfarin. This was discontinued. She is maintained on aspirin.  PMHx:  Past Medical History  Diagnosis Date  . PAF (paroxysmal atrial fibrillation)   . CVA (cerebral infarction) 04/2009    LMCA & posterior cerebral - excellent recovery, mild expressive aphasa  . Hypertension   . History of nuclear stress test 06/2009    dipyridamole; negative, normal pattern of perfusion   . Gait disorder 04/03/2014  . Aphasia as late effect of stroke 04/03/2014  . Osteopenia   . Chronic kidney disease   . OA (osteoarthritis)   . Anxiety   . Vitamin D deficiency   . Valvular heart disease   . Multiple lung nodules   . Diverticulitis   . Nephrolithiasis   . Cervical spondylosis     Past Surgical History  Procedure Laterality Date  . Cataract extraction Bilateral 2013  . Replacement total knee Right 2004  . Appendectomy    . Abdominal hysterectomy  1975  . Instestinal surgery  1987    r/t blockage  . Nose surgery  1985    r/t deviated septum  . Transthoracic echocardiogram  09/19/2012    EF 55-60% with normal systolic function; mod AR; MVP with mild-mod regurg; LA severely dilated; RV systolic pressure increased; mod dilated RA; RV systolic pressure increased - mild pulm HTN  FAMHx:  Family History  Problem Relation Age of Onset  . Lymphoma Mother   . Aneurysm Other     grandmother, uncles, cousins  . Tuberculosis Father     SOCHx:   reports that she has never smoked. She has never used smokeless tobacco. She reports that she does not drink alcohol or use illicit drugs.  ALLERGIES:  Allergies  Allergen Reactions  . Amitriptyline Hcl Itching  . Atenolol Other (See Comments)    Hair loss, thin nails, itching  . Azithromycin Other (See Comments)    Mouth sores; sore throat  .  Cefuroxime   . Daypro [Oxaprozin] Other (See Comments)    dyspepsia  . Doxycycline Other (See Comments)    Mouth sores; sore throat  . Famvir [Famciclovir]   . Hyzaar [Losartan Potassium-Hctz] Other (See Comments)    Headache; tight face  . Iodine Other (See Comments)    Involuntary muscle contractions  . Metoprolol Tartrate Itching  . Relafen [Nabumetone] Other (See Comments)    Mouth sores  . Sterapred [Prednisone] Other (See Comments)    weakness    ROS: A comprehensive review of systems was negative except for: falls  HOME MEDS: Current Outpatient Prescriptions  Medication Sig Dispense Refill  . acetaminophen (TYLENOL) 500 MG tablet Take 500 mg by mouth every 6 (six) hours as needed for mild pain.    Marland Kitchen alendronate (FOSAMAX) 70 MG tablet Take 70 mg by mouth every 7 (seven) days. Take with a full glass of water on an empty stomach.    Marland Kitchen amiodarone (PACERONE) 200 MG tablet Take 1 tablet (200 mg total) by mouth daily. 30 tablet 5  . amLODipine (NORVASC) 10 MG tablet Take 10 mg by mouth daily.    Marland Kitchen aspirin EC 81 MG tablet Take 81 mg by mouth daily.    . Calcium Carb-Cholecalciferol (CALCIUM-VITAMIN D) 500-400 MG-UNIT TABS Take 1 tablet by mouth daily.    . cetirizine (ZYRTEC) 10 MG tablet Take 10 mg by mouth daily.    . fluticasone (FLONASE) 50 MCG/ACT nasal spray Place 2 sprays into both nostrils daily.    . hydrocortisone 2.5 % lotion Apply topically as needed.     Marland Kitchen levothyroxine (SYNTHROID) 25 MCG tablet Take 25 mcg by mouth daily before breakfast.    . metoprolol succinate (TOPROL-XL) 50 MG 24 hr tablet Take 50 mg by mouth daily.     No current facility-administered medications for this visit.    LABS/IMAGING: No results found for this or any previous visit (from the past 48 hour(s)). No results found.  VITALS: BP 138/66 mmHg  Pulse 63  Ht 5' (1.524 m)  Wt 110 lb (49.896 kg)  BMI 21.48 kg/m2  EXAM: General appearance: alert and no distress Neck: no adenopathy,  no carotid bruit, no JVD, supple, symmetrical, trachea midline and thyroid not enlarged, symmetric, no tenderness/mass/nodules Lungs: clear to auscultation bilaterally Heart: regular rate and rhythm, S1, S2 normal and diastolic murmur: holodiastolic 2/6, blowing at 2nd right intercostal space Abdomen: soft, non-tender; bowel sounds normal; no masses,  no organomegaly Extremities: extremities normal, atraumatic, no cyanosis or edema Pulses: 2+ and symmetric Skin: Skin color, texture, turgor normal. No rashes or lesions Neurologic: Grossly normal, mild expressive aphasia Psych: Mood, affect normal  EKG: Normal sinus rhythm at 63  ASSESSMENT: 1. Stable moderate aortic insufficiency 2. Paroxysmal atrial fibrillation - CHADSVASC 4- on aspirin and amiodarone 3. Hypertension-controlled 4. Dizziness with falls 5. Chronic subdural hematomas 6. Hair loss  PLAN: 1.  Mrs. Fought unfortunately had continued falls and instability and developed chronic subdural hematomas on warfarin. That has been discontinued and I agree with this approach. She is no longer a candidate for it although her stroke risk remains elevated with a CHADSVASC or of 4. She will be maintained on aspirin therapy. No further suggestions with regards to A. fib at this time. She should stay on amiodarone which would decrease her risk of recurrence. Plan to see her back and 6 months.  Pixie Casino, MD, [facility] Attending Cardiologist [facility] [place] 09/05/2014, 5:12 PM

## 2014-09-05 NOTE — Patient Instructions (Signed)
Dr.Hilty wants you to follow-up in: SIX MONTHS. You will receive a reminder letter in the mail two months in advance. If you don't receive a letter, please call our office to schedule the follow-up appointment.

## 2014-09-06 ENCOUNTER — Ambulatory Visit: Payer: Self-pay | Admitting: Pharmacist Clinician (PhC)/ Clinical Pharmacy Specialist

## 2014-09-11 ENCOUNTER — Ambulatory Visit
Admission: RE | Admit: 2014-09-11 | Discharge: 2014-09-11 | Disposition: A | Discharge status: Home / Self Care | Payer: Medicare Other | Source: Ambulatory Visit | Attending: Neurology | Admitting: Neurology

## 2014-09-11 ENCOUNTER — Telehealth: Payer: Self-pay | Admitting: Neurology

## 2014-09-11 DIAGNOSIS — IMO0001 Reserved for inherently not codable concepts without codable children: Secondary | ICD-10-CM

## 2014-09-11 DIAGNOSIS — R209 Unspecified disturbances of skin sensation: Principal | ICD-10-CM

## 2014-09-11 NOTE — Telephone Encounter (Signed)
  I called the patient. The CT of the back does show evidence of nerve root impingement at the S1 level, right greater than left. The patient indicates that she is having intermittent pain, she can tolerate the pain level for now, if this worsens, we will consider an epidural steroid injection in the low back. She has had arterial Dopplers of the legs that were unremarkable.  CT lumbar 09/11/2014:  IMPRESSION: 1. Severe diffuse degenerative disc and joint disease throughout the lumbar spine with a moderately severe scoliosis. 2. Severe right foraminal stenosis at L5-S1 with compression of the right S1 nerve. Bilateral lateral recess impingement at L5-S1 which could affect either or both S1 nerves. 3. Moderate spinal stenosis at L4-5. 4. Moderate spinal stenosis at L3-4.

## 2014-10-03 ENCOUNTER — Other Ambulatory Visit: Payer: Self-pay | Admitting: Internal Medicine

## 2014-10-03 NOTE — Telephone Encounter (Signed)
Rx(s) sent to pharmacy electronically.  

## 2014-12-14 ENCOUNTER — Emergency Department (HOSPITAL_COMMUNITY): Payer: Medicare Other

## 2014-12-14 ENCOUNTER — Encounter (HOSPITAL_COMMUNITY): Payer: Self-pay

## 2014-12-14 ENCOUNTER — Inpatient Hospital Stay (HOSPITAL_COMMUNITY)
Admission: EM | Admit: 2014-12-14 | Discharge: 2014-12-15 | DRG: 313 | Disposition: A | Discharge status: Home / Self Care | Payer: Medicare Other | Attending: Internal Medicine | Admitting: Internal Medicine

## 2014-12-14 DIAGNOSIS — R0789 Other chest pain: Principal | ICD-10-CM | POA: Diagnosis present

## 2014-12-14 DIAGNOSIS — I69320 Aphasia following cerebral infarction: Secondary | ICD-10-CM

## 2014-12-14 DIAGNOSIS — I129 Hypertensive chronic kidney disease with stage 1 through stage 4 chronic kidney disease, or unspecified chronic kidney disease: Secondary | ICD-10-CM | POA: Diagnosis present

## 2014-12-14 DIAGNOSIS — M1990 Unspecified osteoarthritis, unspecified site: Secondary | ICD-10-CM | POA: Diagnosis present

## 2014-12-14 DIAGNOSIS — N179 Acute kidney failure, unspecified: Secondary | ICD-10-CM | POA: Diagnosis present

## 2014-12-14 DIAGNOSIS — M25512 Pain in left shoulder: Secondary | ICD-10-CM | POA: Diagnosis present

## 2014-12-14 DIAGNOSIS — R079 Chest pain, unspecified: Secondary | ICD-10-CM | POA: Diagnosis not present

## 2014-12-14 DIAGNOSIS — Z881 Allergy status to other antibiotic agents status: Secondary | ICD-10-CM

## 2014-12-14 DIAGNOSIS — I480 Paroxysmal atrial fibrillation: Secondary | ICD-10-CM | POA: Diagnosis present

## 2014-12-14 DIAGNOSIS — E039 Hypothyroidism, unspecified: Secondary | ICD-10-CM | POA: Diagnosis present

## 2014-12-14 DIAGNOSIS — Z888 Allergy status to other drugs, medicaments and biological substances status: Secondary | ICD-10-CM

## 2014-12-14 DIAGNOSIS — R296 Repeated falls: Secondary | ICD-10-CM | POA: Diagnosis present

## 2014-12-14 DIAGNOSIS — R269 Unspecified abnormalities of gait and mobility: Secondary | ICD-10-CM | POA: Diagnosis present

## 2014-12-14 DIAGNOSIS — I10 Essential (primary) hypertension: Secondary | ICD-10-CM | POA: Diagnosis present

## 2014-12-14 DIAGNOSIS — N189 Chronic kidney disease, unspecified: Secondary | ICD-10-CM | POA: Diagnosis present

## 2014-12-14 DIAGNOSIS — M25519 Pain in unspecified shoulder: Secondary | ICD-10-CM | POA: Diagnosis present

## 2014-12-14 LAB — COMPREHENSIVE METABOLIC PANEL
ALBUMIN: 4.1 g/dL (ref 3.5–5.0)
ALT: 36 U/L (ref 14–54)
ANION GAP: 12 (ref 5–15)
AST: 48 U/L — ABNORMAL HIGH (ref 15–41)
Alkaline Phosphatase: 90 U/L (ref 38–126)
BILIRUBIN TOTAL: 0.9 mg/dL (ref 0.3–1.2)
BUN: 33 mg/dL — AB (ref 6–20)
CALCIUM: 9.3 mg/dL (ref 8.9–10.3)
CO2: 22 mmol/L (ref 22–32)
Chloride: 103 mmol/L (ref 101–111)
Creatinine, Ser: 1.68 mg/dL — ABNORMAL HIGH (ref 0.44–1.00)
GFR calc Af Amer: 30 mL/min — ABNORMAL LOW (ref >60)
GFR, EST NON AFRICAN AMERICAN: 26 mL/min — AB (ref >60)
GLUCOSE: 128 mg/dL — AB (ref 65–99)
POTASSIUM: 4.1 mmol/L (ref 3.5–5.1)
SODIUM: 137 mmol/L (ref 135–145)
TOTAL PROTEIN: 7.3 g/dL (ref 6.5–8.1)

## 2014-12-14 LAB — CBC WITH DIFFERENTIAL/PLATELET
BASOS ABS: 0 10*3/uL (ref 0.0–0.1)
Basophils Relative: 0 % (ref 0–1)
EOS ABS: 0.1 10*3/uL (ref 0.0–0.7)
Eosinophils Relative: 1 % (ref 0–5)
HEMATOCRIT: 37.1 % (ref 36.0–46.0)
Hemoglobin: 12.6 g/dL (ref 12.0–15.0)
Lymphocytes Relative: 15 % (ref 12–46)
Lymphs Abs: 1.1 10*3/uL (ref 0.7–4.0)
MCH: 32.1 pg (ref 26.0–34.0)
MCHC: 34 g/dL (ref 30.0–36.0)
MCV: 94.4 fL (ref 78.0–100.0)
MONO ABS: 0.4 10*3/uL (ref 0.1–1.0)
Monocytes Relative: 5 % (ref 3–12)
Neutro Abs: 5.7 10*3/uL (ref 1.7–7.7)
Neutrophils Relative %: 78 % — ABNORMAL HIGH (ref 43–77)
PLATELETS: 256 10*3/uL (ref 150–400)
RBC: 3.93 MIL/uL (ref 3.87–5.11)
RDW: 14.9 % (ref 11.5–15.5)
WBC: 7.2 10*3/uL (ref 4.0–10.5)

## 2014-12-14 LAB — TROPONIN I

## 2014-12-14 LAB — TSH: TSH: 2.019 u[IU]/mL (ref 0.350–4.500)

## 2014-12-14 LAB — I-STAT TROPONIN, ED: TROPONIN I, POC: 0.01 ng/mL (ref 0.00–0.08)

## 2014-12-14 MED ORDER — ACETAMINOPHEN 325 MG PO TABS: 650.0000 mg | ORAL_TABLET | Freq: Four times a day (QID) | ORAL | Status: DC | PRN

## 2014-12-14 MED ORDER — AMIODARONE HCL 200 MG PO TABS
200.0000 mg | ORAL_TABLET | Freq: Every day | ORAL | Status: DC
  Administered 2014-12-14 – 2014-12-15 (×2): 200 mg via ORAL
  Filled 2014-12-14 (×2): qty 1

## 2014-12-14 MED ORDER — HEPARIN SODIUM (PORCINE) 5000 UNIT/ML IJ SOLN
5000.0000 [IU] | Freq: Three times a day (TID) | INTRAMUSCULAR | Status: DC
  Administered 2014-12-14: 5000 [IU] via SUBCUTANEOUS
  Filled 2014-12-14: qty 1

## 2014-12-14 MED ORDER — ASPIRIN 81 MG PO CHEW: 324.0000 mg | CHEWABLE_TABLET | Freq: Once | ORAL | Status: DC

## 2014-12-14 MED ORDER — SODIUM CHLORIDE 0.9 % IV SOLN
INTRAVENOUS | Status: AC
  Administered 2014-12-14: 21:00:00 via INTRAVENOUS

## 2014-12-14 MED ORDER — ONDANSETRON HCL 4 MG PO TABS: 4.0000 mg | ORAL_TABLET | Freq: Four times a day (QID) | ORAL | Status: DC | PRN

## 2014-12-14 MED ORDER — ACETAMINOPHEN 650 MG RE SUPP: 650.0000 mg | Freq: Four times a day (QID) | RECTAL | Status: DC | PRN

## 2014-12-14 MED ORDER — METOPROLOL SUCCINATE ER 25 MG PO TB24
25.0000 mg | ORAL_TABLET | Freq: Every day | ORAL | Status: DC
  Administered 2014-12-14 – 2014-12-15 (×2): 25 mg via ORAL
  Filled 2014-12-14 (×2): qty 1

## 2014-12-14 MED ORDER — NITROGLYCERIN 0.4 MG SL SUBL
0.4000 mg | SUBLINGUAL_TABLET | SUBLINGUAL | Status: DC | PRN
  Administered 2014-12-14: 0.4 mg via SUBLINGUAL
  Filled 2014-12-14: qty 1

## 2014-12-14 MED ORDER — SODIUM CHLORIDE 0.9 % IJ SOLN
3.0000 mL | Freq: Two times a day (BID) | INTRAMUSCULAR | Status: DC
  Administered 2014-12-14 (×2): 3 mL via INTRAVENOUS

## 2014-12-14 MED ORDER — AMLODIPINE BESYLATE 10 MG PO TABS
10.0000 mg | ORAL_TABLET | Freq: Every day | ORAL | Status: DC
  Filled 2014-12-14: qty 1

## 2014-12-14 MED ORDER — TROLAMINE SALICYLATE 10 % EX CREA
TOPICAL_CREAM | Freq: Two times a day (BID) | CUTANEOUS | Status: DC | PRN
  Filled 2014-12-14: qty 85

## 2014-12-14 MED ORDER — ACETAMINOPHEN 325 MG PO TABS: 650.0000 mg | ORAL_TABLET | ORAL | Status: DC | PRN

## 2014-12-14 MED ORDER — ALENDRONATE SODIUM 70 MG PO TABS: 70.0000 mg | ORAL_TABLET | ORAL | Status: DC

## 2014-12-14 MED ORDER — ALUM & MAG HYDROXIDE-SIMETH 200-200-20 MG/5ML PO SUSP
30.0000 mL | Freq: Four times a day (QID) | ORAL | Status: DC | PRN
Start: 2014-12-14 — End: 2014-12-15

## 2014-12-14 MED ORDER — ASPIRIN EC 81 MG PO TBEC
81.0000 mg | DELAYED_RELEASE_TABLET | Freq: Every day | ORAL | Status: DC
  Administered 2014-12-15: 81 mg via ORAL
  Filled 2014-12-14: qty 1

## 2014-12-14 MED ORDER — MORPHINE SULFATE 2 MG/ML IJ SOLN: 1.0000 mg | INTRAMUSCULAR | Status: DC | PRN

## 2014-12-14 MED ORDER — LEVOTHYROXINE SODIUM 50 MCG PO TABS
50.0000 ug | ORAL_TABLET | Freq: Every day | ORAL | Status: DC
  Administered 2014-12-15: 50 ug via ORAL
  Filled 2014-12-14: qty 1

## 2014-12-14 MED ORDER — OMEGA-3-ACID ETHYL ESTERS 1 G PO CAPS
1.0000 g | ORAL_CAPSULE | Freq: Every day | ORAL | Status: DC
  Administered 2014-12-14 – 2014-12-15 (×2): 1 g via ORAL
  Filled 2014-12-14 (×2): qty 1

## 2014-12-14 MED ORDER — GI COCKTAIL ~~LOC~~: 30.0000 mL | Freq: Four times a day (QID) | ORAL | Status: DC | PRN

## 2014-12-14 MED ORDER — ONDANSETRON HCL 4 MG/2ML IJ SOLN: 4.0000 mg | Freq: Four times a day (QID) | INTRAMUSCULAR | Status: DC | PRN

## 2014-12-14 MED ORDER — ACETAMINOPHEN 500 MG PO TABS: 500.0000 mg | ORAL_TABLET | Freq: Four times a day (QID) | ORAL | Status: DC | PRN

## 2014-12-14 NOTE — Progress Notes (Signed)

## 2014-12-14 NOTE — Discharge Instructions (Signed)
Please recheck with your doctor this week.  Return here if you are worse at any time.   Chest Pain (Nonspecific) It is often hard to give a diagnosis for the cause of chest pain. There is always a chance that your pain could be related to something serious, such as a heart attack or a blood clot in the lungs. You need to follow up with your doctor. HOME CARE  If antibiotic medicine was given, take it as directed by your doctor. Finish the medicine even if you start to feel better.  For the next few days, avoid activities that bring on chest pain. Continue physical activities as told by your doctor.  Do not use any tobacco products. This includes cigarettes, chewing tobacco, and e-cigarettes.  Avoid drinking alcohol.  Only take medicine as told by your doctor.  Follow your doctor's suggestions for more testing if your chest pain does not go away.  Keep all doctor visits you made. GET HELP IF:  Your chest pain does not go away, even after treatment.  You have a rash with blisters on your chest.  You have a fever. GET HELP RIGHT AWAY IF:   You have more pain or pain that spreads to your arm, neck, jaw, back, or belly (abdomen).  You have shortness of breath.  You cough more than usual or cough up blood.  You have very bad back or belly pain.  You feel sick to your stomach (nauseous) or throw up (vomit).  You have very bad weakness.  You pass out (faint).  You have chills. This is an emergency. Do not wait to see if the problems will go away. Call your local emergency services (911 in [place] Do not drive yourself to the hospital. MAKE SURE YOU:   Understand these instructions.  Will watch your condition.  Will get help right away if you are not doing well or get worse. Document Released: 10/06/2007 Document Revised: 04/24/2013 Document Reviewed: 10/06/2007 [facility] Patient Information 2015 [place], [place]. This information is not intended to replace advice given to you  by your health care provider. Make sure you discuss any questions you have with your health care provider.

## 2014-12-14 NOTE — ED Provider Notes (Signed)
CSN: [id number]     Arrival date & time 12/14/14  1021 History   First MD Initiated Contact with Patient 12/14/14 1030     Chief Complaint  Patient presents with  . Chest Pain     (Consider location/radiation/quality/duration/timing/severity/associated sxs/prior Treatment) HPI   SW is a 81 yo female with a PMH of HTN, hypothyroid, CVA x 3, aortic insufficiency, A fib who presents with left sternal border pain x 10 days. Nothing brings the pain on, but it seems to be relieved by nitroglycerin. She cannot describe the pain, but states she feels like there is an intermittent feeling of fluttering. Patient endorses a chronic yellow productive cough. Patient reports she also has left shoulder pain that became worse in the past 10 days. She has had a distal acromion fracture in 2007 in the left shoulder and 2 steroid shots that have not alleviated the pain. She denies shortness of breath, nausea, vomiting, numbness, tingling, muscle weakness, recent history of falls. Patient with pain currently at 3/10.    PE: Lying comfortably in room with two sons at bedside  Pulm: CTAB. No rhonchi, rales, wheezes Cardio: RRR. No rubs, gallops, murmurs. No peripheral edema. 2+ pedal & radial pulses Chest: Chest wall tenderness elicited  over the [id number] left ribs in the midclavicular line  Extremities: Light sensation intact distally. 5/5 grip and pedal strength    CXR, troponin, EKG, CMP, CBC Troponin 0.01  Cannot r/o ACS d/t age    Past Medical History  Diagnosis Date  . PAF (paroxysmal atrial fibrillation)   . CVA (cerebral infarction) 04/2009    LMCA & posterior cerebral - excellent recovery, mild expressive aphasa  . Hypertension   . History of nuclear stress test 06/2009    dipyridamole; negative, normal pattern of perfusion   . Gait disorder 04/03/2014  . Aphasia as late effect of stroke 04/03/2014  . Osteopenia   . Chronic kidney disease   . OA (osteoarthritis)   . Anxiety   . Vitamin D  deficiency   . Valvular heart disease   . Multiple lung nodules   . Diverticulitis   . Nephrolithiasis   . Cervical spondylosis    Past Surgical History  Procedure Laterality Date  . Cataract extraction Bilateral 2013  . Replacement total knee Right 2004  . Appendectomy    . Abdominal hysterectomy  1975  . Instestinal surgery  1987    r/t blockage  . Nose surgery  1985    r/t deviated septum  . Transthoracic echocardiogram  09/19/2012    EF 55-60% with normal systolic function; mod AR; MVP with mild-mod regurg; LA severely dilated; RV systolic pressure increased; mod dilated RA; RV systolic pressure increased - mild pulm HTN    Family History  Problem Relation Age of Onset  . Lymphoma Mother   . Aneurysm Other     grandmother, uncles, cousins  . Tuberculosis Father    Social History  Substance Use Topics  . Smoking status: Never Smoker   . Smokeless tobacco: Never Used  . Alcohol Use: No   OB History    No data available     Review of Systems  All other systems reviewed and are negative.     Allergies  Amitriptyline hcl; Atenolol; Azithromycin; Cefuroxime; Daypro; Doxycycline; Famvir; Hyzaar; Iodine; Metoprolol tartrate; Relafen; and Sterapred  Home Medications   Prior to Admission medications   Medication Sig Start Date End Date Taking? Authorizing Provider  acetaminophen (TYLENOL) 500 MG tablet Take 500  mg by mouth every 6 (six) hours as needed for mild pain.    Historical Provider, MD  alendronate (FOSAMAX) 70 MG tablet Take 70 mg by mouth every 7 (seven) days. Take with a full glass of water on an empty stomach.    Historical Provider, MD  amiodarone (PACERONE) 200 MG tablet Take 1 tablet (200 mg total) by mouth daily. 08/05/14   Pixie Casino, MD  amLODipine (NORVASC) 10 MG tablet Take 10 mg by mouth daily.    Historical Provider, MD  aspirin EC 81 MG tablet Take 81 mg by mouth daily.    Historical Provider, MD  Calcium Carb-Cholecalciferol (CALCIUM-VITAMIN  D) 500-400 MG-UNIT TABS Take 1 tablet by mouth daily.    Historical Provider, MD  cetirizine (ZYRTEC) 10 MG tablet Take 10 mg by mouth daily.    Historical Provider, MD  fluticasone (FLONASE) 50 MCG/ACT nasal spray Place 2 sprays into both nostrils daily.    Historical Provider, MD  hydrocortisone 2.5 % lotion Apply topically as needed.     Historical Provider, MD  levothyroxine (SYNTHROID) 25 MCG tablet Take 25 mcg by mouth daily before breakfast.    Historical Provider, MD  metoprolol succinate (TOPROL-XL) 50 MG 24 hr tablet Take 50 mg by mouth daily. 07/04/14   Historical Provider, MD   BP 138/55 mmHg  Pulse 57  Temp(Src) 97.9 F (36.6 C) (Oral)  Resp 14  SpO2 100% Physical Exam  Constitutional: She is oriented to person, place, and time. She appears well-developed and well-nourished.  HENT:  Head: Normocephalic and atraumatic.  Right Ear: External ear normal.  Left Ear: External ear normal.  Nose: Nose normal.  Mouth/Throat: Oropharynx is clear and moist.  Eyes: Conjunctivae are normal. Pupils are equal, round, and reactive to light.  Neck: Normal range of motion.  Cardiovascular: Normal rate, regular rhythm, normal heart sounds and intact distal pulses.   Pulmonary/Chest: Effort normal and breath sounds normal.  Abdominal: Soft. Bowel sounds are normal.  Musculoskeletal:       Arms: Neurological: She is alert and oriented to person, place, and time. She has normal reflexes. She displays normal reflexes. No cranial nerve deficit. Coordination normal.  Skin: Skin is warm and dry.  Psychiatric: She has a normal mood and affect.  Nursing note and vitals reviewed.   ED Course  Procedures (including critical care time) Labs Review Labs Reviewed  CBC WITH DIFFERENTIAL/PLATELET - Abnormal; Notable for the following:    Neutrophils Relative % 78 (*)    All other components within normal limits  COMPREHENSIVE METABOLIC PANEL - Abnormal; Notable for the following:    Glucose, Bld  128 (*)    BUN 33 (*)    Creatinine, Ser 1.68 (*)    AST 48 (*)    GFR calc non Af Amer 26 (*)    GFR calc Af Amer 30 (*)    All other components within normal limits  Randolm Idol, ED    Imaging Review Dg Chest 2 View  12/14/2014   CLINICAL DATA:  Left-sided chest pain.  EXAM: CHEST - 2 VIEW  COMPARISON:  CT of the chest on 01/14/2010  FINDINGS: The heart size is normal. There is no evidence of pulmonary edema, consolidation, pneumothorax, nodule or pleural fluid. The aorta shows mild ectasia. Bony structures show osteopenia and a mild leftward convex scoliosis of the thoracic spine.  IMPRESSION: No active disease.   Electronically Signed   By: Aletta Edouard M.D.   On: 12/14/2014 11:47  Dg Shoulder Left  12/14/2014   CLINICAL DATA:  Left shoulder pain without reported injury. Initial encounter.  EXAM: LEFT SHOULDER - 2+ VIEW  COMPARISON:  May 04, 2005.  FINDINGS: There is no evidence of fracture or dislocation. There is no evidence of arthropathy or other focal bone abnormality. Soft tissues are unremarkable.  IMPRESSION: Normal left shoulder.   Electronically Signed   By: Marijo Conception, M.D.   On: 12/14/2014 11:49   I, Ellery Meroney S, personally reviewed and evaluated these images and lab results as part of my medical decision-making.   EKG Interpretation   Date/Time:  Saturday December 14 2014 10:24:03 EDT Ventricular Rate:  66 PR Interval:  81 QRS Duration: 95 QT Interval:  459 QTC Calculation: 481 R Axis:   21 Text Interpretation:  Normal sinus rhythm Non-specific ST-t changes  lateral leads Confirmed by Robson Trickey MD, Simrit Gohlke ([id number]) on 12/14/2014 10:32:27  AM      MDM   Final diagnoses:  Chest pain, unspecified chest pain type  Left shoulder pain    81 y.o. Female with atypical  Chest pain present intermittently for one week.  She has ttp of shoulder and chest on exam.  Patient with abnormal ekg, but unchanged from prior ekg of November 2015.  Patient complaining  of radiating chest pain on re-exam with radiation to left neck.  She is being given nitro and plan reassessment.  Nitroglycerin resolved the chest component of the pain and that she continues to have some left shoulder pain which does appear to be musculoskeletal on our exam. Given patient's age and difficulyt obtaining clear history and equivocal  exam, will observe patient for serial troponins and further evaluation.   Pattricia Boss, MD 12/14/14 [phone]

## 2014-12-14 NOTE — H&P (Signed)
[facility] History and Physical                                                                                    Madison Berg, is a 81 y.o. female  MRN: [id number]   DOB - 1923-11-02  Admit Date - 12/14/2014  Outpatient Primary MD for the patient is Madison Pel, MD  Cardiologist: Dr. Debara Berg  Referring Physician:  Dr. Jeanell Berg  Chief Complaint:   Chief Complaint  Patient presents with  . Chest Pain     HPI  Madison Berg  is a 81 y.o. female, with paroxysmal atrial fibrillation, chronic kidney disease, osteoarthritis, and history of multiple CVAs with residual expressive aphasia. She presented to the emergency department today for atypical chest pain. Her son Madison Berg is at bedside, and he helps with the history. He has had some EMT training. It is difficult to take a history from the patient as she is very tangential and has expressive aphasia. However she reports short episodes of intermittent chest pain for the past 10 days. The pain is located in her left chest and does not change with rest or exertion. It does not seem to be related to food. She also describes palpitations. These do not occur at the same time as the pain. Her son Madison Berg relates that they decided to bring her this morning because for the first time the chest pain radiated down her left arm, she was diaphoretic and nauseated. [name] takes amiodarone and an 81 mg aspirin for her paroxysmal atrial fibrillation. She was taken off of anticoagulation due to multiple falls and a subdural hemorrhage.  The patient also reports left shoulder pain. This appears to be chronic and has not been relieved by steroid injections. The patient denies any recent illness, headache, dizziness, vomiting, dysuria, change in bowel habits.  Review of Systems   In addition to the HPI above,  No Fever-chills, No Headache, No changes with Vision or hearing, No problems swallowing food or Liquids, No Abdominal pain, No Nausea or  Vomiting, Bowel movements are regular, No Blood in stool or Urine, No dysuria, No new skin rashes or bruises, No new joints pains-aches,  No new weakness, tingling, numbness in any extremity, No recent weight gain or loss, A full 10 point Review of Systems was done, except as stated above, all other Review of Systems were negative.  Past Medical History  Past Medical History  Diagnosis Date  . PAF (paroxysmal atrial fibrillation)   . CVA (cerebral infarction) 04/2009    LMCA & posterior cerebral - excellent recovery, mild expressive aphasa  . Hypertension   . History of nuclear stress test 06/2009    dipyridamole; negative, normal pattern of perfusion   . Gait disorder 04/03/2014  . Aphasia as late effect of stroke 04/03/2014  . Osteopenia   . Chronic kidney disease   . OA (osteoarthritis)   . Anxiety   . Vitamin D deficiency   . Valvular heart disease   . Multiple lung nodules   . Diverticulitis   . Nephrolithiasis   . Cervical spondylosis     Past Surgical History  Procedure Laterality Date  . Cataract  extraction Bilateral 2013  . Replacement total knee Right 2004  . Appendectomy    . Abdominal hysterectomy  1975  . Instestinal surgery  1987    r/t blockage  . Nose surgery  1985    r/t deviated septum  . Transthoracic echocardiogram  09/19/2012    EF 55-60% with normal systolic function; mod AR; MVP with mild-mod regurg; LA severely dilated; RV systolic pressure increased; mod dilated RA; RV systolic pressure increased - mild pulm HTN       Social History Social History  Substance Use Topics  . Smoking status: Never Smoker   . Smokeless tobacco: Never Used  . Alcohol Use: No   l[place] 2 [place]. [place]. She has gait instability.  Family History Family History  Problem Relation Age of Onset  . Lymphoma Mother   . Aneurysm Other     grandmother, uncles, cousins  . Tuberculosis Father     Prior to Admission medications   Medication Sig Start Date End Date  Taking? Authorizing Provider  acetaminophen (TYLENOL) 500 MG tablet Take 500 mg by mouth every 6 (six) hours as needed for mild pain.   Yes Historical Provider, MD  alendronate (FOSAMAX) 70 MG tablet Take 70 mg by mouth every 7 (seven) days. Take with a full glass of water on an empty stomach.   Yes Historical Provider, MD  amiodarone (PACERONE) 200 MG tablet Take 1 tablet (200 mg total) by mouth daily. 08/05/14  Yes Madison Casino, MD  amLODipine (NORVASC) 10 MG tablet Take 10 mg by mouth at bedtime.    Yes Historical Provider, MD  aspirin EC 81 MG tablet Take 81 mg by mouth daily.   Yes Historical Provider, MD  levothyroxine (SYNTHROID, LEVOTHROID) 50 MCG tablet Take 50 mcg by mouth daily before breakfast.   Yes Historical Provider, MD  metoprolol succinate (TOPROL-XL) 50 MG 24 hr tablet Take 50 mg by mouth daily. 07/04/14  Yes Historical Provider, MD  Multiple Vitamins-Minerals (MULTIVITAMIN & MINERAL PO) Take 1 tablet by mouth daily.   Yes Historical Provider, MD  Omega-3 Fatty Acids (FISH OIL PO) Take 1 tablet by mouth at bedtime.   Yes Historical Provider, MD    Allergies  Allergen Reactions  . Amitriptyline Hcl Itching  . Atenolol Other (See Comments)    Hair loss, thin nails, itching  . Azithromycin Other (See Comments)    Mouth sores; sore throat  . Cefuroxime   . Daypro [Oxaprozin] Other (See Comments)    dyspepsia  . Doxycycline Other (See Comments)    Mouth sores; sore throat  . Famvir [Famciclovir]   . Hyzaar [Losartan Potassium-Hctz] Other (See Comments)    Headache; tight face  . Iodine Other (See Comments)    Involuntary muscle contractions  . Metoprolol Tartrate Itching  . Relafen [Nabumetone] Other (See Comments)    Mouth sores  . Sterapred [Prednisone] Other (See Comments)    weakness    Physical Exam  Vitals  Blood pressure 149/51, pulse 62, temperature 99.3 F (37.4 C), temperature source Oral, resp. rate 18, SpO2 95 %.   General: Thin, pleasant, elderly  female lying in bed in NAD, son at bedside  Psych:  Normal affect and insight, Not Suicidal or Homicidal, Awake Alert, Oriented X 3.  Neuro:   Expressive aphasia, no acute abnormalities  ENT:  Ears and Eyes appear Normal, Conjunctivae clear, PER. Moist oral mucosa without erythema or exudates.  Neck:  Supple, No lymphadenopathy appreciated  Respiratory:  Symmetrical chest  wall movement, Good air movement bilaterally, CTAB.  Cardiac:  RRR, No Murmurs, no LE edema noted, + JVP appreciated  Abdomen:  Positive bowel sounds, Soft, Non tender, Non distended,  No masses appreciated  Skin:  No Cyanosis, Normal Skin Turgor, No Skin Rash or Bruise.  Extremities:  Able to move all 4. 5/5 strength in each,  no effusions.  Data Review  CBC  Recent Labs Lab 12/14/14 1122  WBC 7.2  HGB 12.6  HCT 37.1  PLT 256  MCV 94.4  MCH 32.1  MCHC 34.0  RDW 14.9  LYMPHSABS 1.1  MONOABS 0.4  EOSABS 0.1  BASOSABS 0.0    Chemistries   Recent Labs Lab 12/14/14 1122  NA 137  K 4.1  CL 103  CO2 22  GLUCOSE 128*  BUN 33*  CREATININE 1.68*  CALCIUM 9.3  AST 48*  ALT 36  ALKPHOS 90  BILITOT 0.9     Urinalysis-pending   Imaging results:   Dg Chest 2 View  12/14/2014   CLINICAL DATA:  Left-sided chest pain.  EXAM: CHEST - 2 VIEW  COMPARISON:  CT of the chest on 01/14/2010  FINDINGS: The heart size is normal. There is no evidence of pulmonary edema, consolidation, pneumothorax, nodule or pleural fluid. The aorta shows mild ectasia. Bony structures show osteopenia and a mild leftward convex scoliosis of the thoracic spine.  IMPRESSION: No active disease.   Electronically Signed   By: Aletta Edouard M.D.   On: 12/14/2014 11:47   Dg Shoulder Left  12/14/2014   CLINICAL DATA:  Left shoulder pain without reported injury. Initial encounter.  EXAM: LEFT SHOULDER - 2+ VIEW  COMPARISON:  May 04, 2005.  FINDINGS: There is no evidence of fracture or dislocation. There is no evidence of  arthropathy or other focal bone abnormality. Soft tissues are unremarkable.  IMPRESSION: Normal left shoulder.   Electronically Signed   By: Marijo Conception, M.D.   On: 12/14/2014 11:49    My personal review of EKG: Sinus rhythm, some concern for ST depression in leads V4 and V5   Assessment & Plan  Principal Problem:   Chest pain Active Problems:   Essential hypertension   Aphasia as late effect of stroke   Pain in joint, shoulder region   Acute-on-chronic kidney injury  Chest pain Short intermittent left-sided. We will admit her to a telemetry bed, cycle her enzymes, check a 2-D echo I will ask cardiology for their opinion regarding her EKG. We will consult them if her enzymes become elevated. Repeat EKG PRN chest pain. Will also add GI cocktail in case this is GERD related.  Atrial fibrillation Patient complains of fluttering not necessarily associated with chest pain. Patient is on amiodarone and metoprolol XL. Will decrease dose of metoprolol temporarily as she is mildly bradycardic Coumadin DC'd due to subdural hemorrhage and high fall risk  Acute on chronic kidney injury Patient's baseline creatinine appears to be about 1.3. Is currently 1.6. We'll give gentle hydration and monitor bmet. She reports she is eating well so I do not believe this is from poor by mouth intake.  Hypertension Continue amlodipine and metoprolol (a decreased dose)  Thyroid disease Continue Synthroid. Check TSH. Her last TSH in the system was from 2012.  Consultants Called:  Will discuss EKG with cardiology PA.  Family Communication:   Son at bedside  Code Status:  Full code. This precious lady likely deserves a more in-depth goals of care discussion  Condition:  Guarded,  but stable  Potential Disposition: To home in 24-48 hours if workup is negative.  Time spent in minutes : [address]  PA-C on 12/14/2014 at 3:12 PM Between 7am to 7pm - Pager - [phone] After 7pm go to  [email or web address] - password [email or web address] And look for the night coverage person covering me after hours  [facility]

## 2014-12-14 NOTE — ED Notes (Signed)
[facility]- pt coming from home, has had dull chest pain for a week. Pt reports some radiation to the left shoulder. 12 lead unremarkable. 324 ASA and 1nitro en route. Bilateral 20g IVS in the [facility]. Pain initially a 6/10 now down to 1/10.

## 2014-12-14 NOTE — ED Notes (Signed)
MD at bedside. 

## 2014-12-15 ENCOUNTER — Inpatient Hospital Stay (HOSPITAL_BASED_OUTPATIENT_CLINIC_OR_DEPARTMENT_OTHER): Payer: Medicare Other

## 2014-12-15 DIAGNOSIS — R079 Chest pain, unspecified: Secondary | ICD-10-CM

## 2014-12-15 DIAGNOSIS — N189 Chronic kidney disease, unspecified: Secondary | ICD-10-CM

## 2014-12-15 DIAGNOSIS — N179 Acute kidney failure, unspecified: Secondary | ICD-10-CM

## 2014-12-15 DIAGNOSIS — I10 Essential (primary) hypertension: Secondary | ICD-10-CM

## 2014-12-15 DIAGNOSIS — I69320 Aphasia following cerebral infarction: Secondary | ICD-10-CM

## 2014-12-15 LAB — URINALYSIS, ROUTINE W REFLEX MICROSCOPIC
Bilirubin Urine: NEGATIVE
Glucose, UA: NEGATIVE mg/dL
Ketones, ur: NEGATIVE mg/dL
Leukocytes, UA: NEGATIVE
NITRITE: NEGATIVE
PH: 7 (ref 5.0–8.0)
PROTEIN: NEGATIVE mg/dL
SPECIFIC GRAVITY, URINE: 1.006 (ref 1.005–1.030)
UROBILINOGEN UA: 0.2 mg/dL (ref 0.0–1.0)

## 2014-12-15 LAB — URINE MICROSCOPIC-ADD ON

## 2014-12-15 LAB — TROPONIN I

## 2014-12-15 LAB — BASIC METABOLIC PANEL
ANION GAP: 6 (ref 5–15)
BUN: 27 mg/dL — AB (ref 6–20)
CALCIUM: 8.4 mg/dL — AB (ref 8.9–10.3)
CO2: 25 mmol/L (ref 22–32)
Chloride: 107 mmol/L (ref 101–111)
Creatinine, Ser: 1.53 mg/dL — ABNORMAL HIGH (ref 0.44–1.00)
GFR calc non Af Amer: 29 mL/min — ABNORMAL LOW (ref >60)
GFR, EST AFRICAN AMERICAN: 34 mL/min — AB (ref >60)
GLUCOSE: 94 mg/dL (ref 65–99)
POTASSIUM: 4 mmol/L (ref 3.5–5.1)
SODIUM: 138 mmol/L (ref 135–145)

## 2014-12-15 MED ORDER — GUAIFENESIN-DM 100-10 MG/5ML PO SYRP: 5.0000 mL | ORAL_SOLUTION | ORAL | Status: DC | PRN

## 2014-12-15 MED ORDER — NITROGLYCERIN 0.4 MG SL SUBL: 0.4000 mg | SUBLINGUAL_TABLET | SUBLINGUAL | Status: DC | PRN

## 2014-12-15 MED ORDER — METOPROLOL SUCCINATE ER 25 MG PO TB24: 25.0000 mg | ORAL_TABLET | Freq: Every day | ORAL | Status: DC

## 2014-12-15 NOTE — Discharge Summary (Signed)
Physician Discharge Summary   Patient ID: Madison Berg MRN: [id number] DOB/AGE: 1923/07/23 81 y.o.  Admit date: 12/14/2014 Discharge date: 12/15/2014  Primary Care Physician:  Horatio Pel, MD  Discharge Diagnoses:    . atypical Chest pain . Essential hypertension . Pain in joint, shoulder region . Acute-on-chronic kidney injury Chronic expressive aphasia from the prior stroke  Consults:  None   Recommendations for Outpatient Follow-up:  Please check BMET at the time of follow-up appointment   DIET: Heart healthy diet    Allergies:   Allergies  Allergen Reactions  . Amitriptyline Hcl Itching  . Atenolol Other (See Comments)    Hair loss, thin nails, itching  . Azithromycin Other (See Comments)    Mouth sores; sore throat  . Cefuroxime   . Daypro [Oxaprozin] Other (See Comments)    dyspepsia  . Doxycycline Other (See Comments)    Mouth sores; sore throat  . Famvir [Famciclovir]   . Hyzaar [Losartan Potassium-Hctz] Other (See Comments)    Headache; tight face  . Iodine Other (See Comments)    Involuntary muscle contractions  . Metoprolol Tartrate Itching  . Relafen [Nabumetone] Other (See Comments)    Mouth sores  . Sterapred [Prednisone] Other (See Comments)    weakness     Discharge Medications:   Medication List    TAKE these medications        acetaminophen 500 MG tablet  Commonly known as:  TYLENOL  Take 500 mg by mouth every 6 (six) hours as needed for mild pain.     amiodarone 200 MG tablet  Commonly known as:  PACERONE  Take 1 tablet (200 mg total) by mouth daily.     amLODipine 10 MG tablet  Commonly known as:  NORVASC  Take 10 mg by mouth at bedtime.     aspirin EC 81 MG tablet  Take 81 mg by mouth daily.     FISH OIL PO  Take 1 tablet by mouth at bedtime.     FOSAMAX 70 MG tablet  Generic drug:  alendronate  Take 70 mg by mouth every 7 (seven) days. Take with a full glass of water on an empty stomach.     levothyroxine 50 MCG tablet  Commonly known as:  SYNTHROID, LEVOTHROID  Take 50 mcg by mouth daily before breakfast.     metoprolol succinate 50 MG 24 hr tablet  Commonly known as:  TOPROL-XL  Take 50 mg by mouth daily.     MULTIVITAMIN & MINERAL PO  Take 1 tablet by mouth daily.     nitroGLYCERIN 0.4 MG SL tablet  Commonly known as:  NITROSTAT  Place 1 tablet (0.4 mg total) under the tongue every 5 (five) minutes as needed for chest pain.         Brief H and P: For complete details please refer to admission H and P, but in brief Madison Berg is a 81 y.o. female, with paroxysmal atrial fibrillation, chronic kidney disease, osteoarthritis, and history of multiple CVAs with residual expressive aphasia. She presented to the emergency department today for atypical chest pain. Her son called was at the bedside and provided the history. Patient has expressive aphasia from prior CVA. However she reported short episodes of intermittent chest pain for the past 10 days. The pain was located in her left chest and does not change with rest or exertion. It does not seem to be related to food. She also described palpitations. These do not occur at the same  time as the pain. Her son Glendell Docker relates that they decided to bring her this morning because for the first time the chest pain radiated down her left arm, she was diaphoretic and nauseated. Patient is on amiodarone and aspirin for paroxysmal atrial fibrillation. She was taken off of anticoagulation due to multiple falls and a subdural hemorrhage. The patient also reports left shoulder pain. This appeared to be chronic and has not been relieved by steroid injections. The patient denied any recent illness, headache, dizziness, vomiting, dysuria, change in bowel habits.  Hospital Course:   Atypical Chest pain resolved Short intermittent left-sided. Patient was admitted to telemetry, serial troponins remained negative. Patient had subtle ST depressions  on the lateral leads hence EKG was discussed with cardiologist, Dr. Dr. Percival Spanish who felt that this was non-concerning at this time however recommended admission for chest pain rule out. Patient was ruled out for acute ACS. 2-D echo showed EF of 55-60%, normal wall motion, no regional wall motion abnormalities, mild mitral valve regurgitation, left and right atrium is severely dilated, mild to moderate tricuspid regurgitation.  Atrial fibrillation Patient complains of fluttering not necessarily associated with chest pain. Patient is on amiodarone and metoprolol XL. Will decrease dose of metoprolol temporarily as she is mildly bradycardic. Coumadin has been discontinued outpatient by cardiology and neurology due to subdural hemorrhage and high fall risk  Acute on chronic kidney injury Patient's baseline creatinine appears to be about 1.3-1.5. Patient presented with a creatinine function 1.68. She was gently hydrated. Creatinine function improved to 1.5  Hypertension Continue amlodipine and metoprolol (a decreased dose)  Thyroid disease Continue Synthroid. TSH 2.0  Day of Discharge BP 127/47 mmHg  Pulse 58  Temp(Src) 98.5 F (36.9 C) (Oral)  Resp 15  Ht 5' (1.524 m)  Wt 50.485 kg (111 lb 4.8 oz)  BMI 21.74 kg/m2  SpO2 97%  Physical Exam: General: Alert and awake oriented x3 not in any acute distress, residual expressive aphasia. HEENT: anicteric sclera, pupils reactive to light and accommodation CVS: S1-S2 clear no murmur rubs or gallops Chest: clear to auscultation bilaterally, no wheezing rales or rhonchi Abdomen: soft nontender, nondistended, normal bowel sounds Extremities: no cyanosis, clubbing or edema noted bilaterally Neuro: Cranial nerves II-XII intact, no focal neurological deficits   The results of significant diagnostics from this hospitalization (including imaging, microbiology, ancillary and laboratory) are listed below for reference.    LAB RESULTS: Basic Metabolic  Panel:  Recent Labs Lab 12/14/14 1122 12/15/14 0304  NA 137 138  K 4.1 4.0  CL 103 107  CO2 22 25  GLUCOSE 128* 94  BUN 33* 27*  CREATININE 1.68* 1.53*  CALCIUM 9.3 8.4*   Liver Function Tests:  Recent Labs Lab 12/14/14 1122  AST 48*  ALT 36  ALKPHOS 90  BILITOT 0.9  PROT 7.3  ALBUMIN 4.1   No results for input(s): LIPASE, AMYLASE in the last 168 hours. No results for input(s): AMMONIA in the last 168 hours. CBC:  Recent Labs Lab 12/14/14 1122  WBC 7.2  NEUTROABS 5.7  HGB 12.6  HCT 37.1  MCV 94.4  PLT 256   Cardiac Enzymes:  Recent Labs Lab 12/14/14 2041 12/15/14 0304  TROPONINI <0.03 <0.03   BNP: Invalid input(s): POCBNP CBG: No results for input(s): GLUCAP in the last 168 hours.  Significant Diagnostic Studies:  Dg Chest 2 View  12/14/2014   CLINICAL DATA:  Left-sided chest pain.  EXAM: CHEST - 2 VIEW  COMPARISON:  CT of the chest  on 01/14/2010  FINDINGS: The heart size is normal. There is no evidence of pulmonary edema, consolidation, pneumothorax, nodule or pleural fluid. The aorta shows mild ectasia. Bony structures show osteopenia and a mild leftward convex scoliosis of the thoracic spine.  IMPRESSION: No active disease.   Electronically Signed   By: Aletta Edouard M.D.   On: 12/14/2014 11:47   Dg Shoulder Left  12/14/2014   CLINICAL DATA:  Left shoulder pain without reported injury. Initial encounter.  EXAM: LEFT SHOULDER - 2+ VIEW  COMPARISON:  May 04, 2005.  FINDINGS: There is no evidence of fracture or dislocation. There is no evidence of arthropathy or other focal bone abnormality. Soft tissues are unremarkable.  IMPRESSION: Normal left shoulder.   Electronically Signed   By: Marijo Conception, M.D.   On: 12/14/2014 11:49    2D ECHO: Study Conclusions  - Left ventricle: The cavity size was normal. Wall thickness was normal. Systolic function was normal. The estimated ejection fraction was in the range of 55% to 60%. Wall motion was  normal; there were no regional wall motion abnormalities. - Aortic valve: There was trivial regurgitation. - Mitral valve: There was mild regurgitation. - Left atrium: The atrium was severely dilated. - Right atrium: The atrium was moderately to severely dilated. - Tricuspid valve: There was mild-moderate regurgitation. - Pulmonary arteries: Systolic pressure was mildly increased. PA peak pressure: 37 mm Hg (S).  Disposition and Follow-up:    DISPOSITION:home    DISCHARGE FOLLOW-UP     Follow-up Information    Follow up with Horatio Pel, MD. Schedule an appointment as soon as possible for a visit in 2 weeks.   Specialty:  Internal Medicine   Why:  for hospital follow-up   Contact information:   [address] [place] [address] [phone]       Follow up with Pixie Casino, MD On 01/28/2015.   Specialty:  Cardiology   Why:  at 3:30PM   Contact information:   [address] [place] [address] [phone]        Time spent on Discharge: 35 minutes  Signed:   Deziah Renwick M.D. [facility] 12/15/2014, 10:55 AM Pager: [phone]

## 2014-12-15 NOTE — Progress Notes (Signed)
Echocardiogram 2D Echocardiogram has been performed.  Joelene Millin 12/15/2014, 10:42 AM

## 2014-12-15 NOTE — Progress Notes (Signed)
PT Cancellation/Discharge Note  Patient Details Name: Madison Berg MRN: [id number] DOB: 07-Mar-1924   Cancelled Treatment:    Reason Eval/Treat Not Completed: PT screened, no needs identified, will sign off Patient ambulating in room with RW.  Patient and son report patient has 24/7 assist, and uses RW at home.  Patient and son report no PT needs at this time.  To be d/c today.  Despina Pole 12/15/2014, 12:50 PM Carita Pian. Sanjuana Kava, [place] Pager [phone]

## 2015-01-28 ENCOUNTER — Ambulatory Visit (INDEPENDENT_AMBULATORY_CARE_PROVIDER_SITE_OTHER): Payer: Medicare Other | Admitting: Internal Medicine

## 2015-01-28 ENCOUNTER — Encounter: Payer: Self-pay | Admitting: Internal Medicine

## 2015-01-28 VITALS — BP 140/62 | HR 72 | Ht 59.5 in | Wt 112.9 lb

## 2015-01-28 DIAGNOSIS — I480 Paroxysmal atrial fibrillation: Secondary | ICD-10-CM

## 2015-01-28 DIAGNOSIS — I639 Cerebral infarction, unspecified: Secondary | ICD-10-CM

## 2015-01-28 DIAGNOSIS — I6200 Nontraumatic subdural hemorrhage, unspecified: Secondary | ICD-10-CM | POA: Diagnosis not present

## 2015-01-28 DIAGNOSIS — S065XAA Traumatic subdural hemorrhage with loss of consciousness status unknown, initial encounter: Secondary | ICD-10-CM

## 2015-01-28 DIAGNOSIS — S065X9A Traumatic subdural hemorrhage with loss of consciousness of unspecified duration, initial encounter: Secondary | ICD-10-CM

## 2015-01-28 DIAGNOSIS — I10 Essential (primary) hypertension: Secondary | ICD-10-CM | POA: Diagnosis not present

## 2015-01-28 NOTE — Patient Instructions (Signed)
Dr Debara Pickett has made no changes today in your current medications or treatment plan.  Dr Debara Pickett recommends that you schedule a follow-up appointment in 1 year. You will receive a reminder letter in the mail two months in advance. If you don't receive a letter, please call our office to schedule the follow-up appointment.

## 2015-01-28 NOTE — Progress Notes (Signed)
OFFICE NOTE  Chief Complaint:  Hospital follow-up  Primary Care Physician: Horatio Pel, MD  HPI:  JADWIGA MICHAELS is a pleasant 81 year old female followed for years by Dr. [name] with a history of paroxysmal atrial fibrillation. She had a stroke in [id number] with an embolic event to her left middle cerebral artery and posterior cerebral artery. She recovered fairly well except she has some mild expressive aphasia. Chest is a history of aortic insufficiency which has been moderate and was recently stable on an echocardiogram in May of 2014. Her ejection fraction is preserved and she said no ischemia. She had a Myoview stress test in February 2011 which was negative. She is on warfarin therapy and her INRs have been therapeutic and stable. She has no complaints of chest pain or shortness of breath. She maintains on amiodarone for rhythm control with her atrial fibrillation. She had recent thyroid function which was in normal limits, but I do not see recent pulmonary function testing. She will need repeat of this testing at her next office visit. Her other main complaint today is problems with bladder incontinence. She previously had seen Dr. Elder Negus and he had advised against medications for incontinence, but her problems have gotten worse and she may be a good candidate for this medication. I understand that she may have had a bladder surgery in the past.  Mrs. Paonessa has no new complaints today. She denies any worsening shortness of breath or chest pain. She is again concerned about hair loss, however I do not appreciate any significant worsening of the thinning of her hair. She is asked whether it is okay for her to use biotin supplements. I do not have a problem with that. She has had a couple of more falls but is resistant to using a walker due to decreased mobility and independence. She is since stopped driving.  I saw Ms. Bronson back in the office today. Unfortunately since  I last saw her she continued to have gait instability and had numerous falls. Ultimately she developed a subdural hematoma and was felt to be a very poor candidate at that point for anticoagulation with warfarin. This was discontinued. She is maintained on aspirin.  Mrs. Chynoweth is doing fairly well. She's had a couple of episodes of cerebral hemorrhage and was taken off of warfarin for this. She's had no falls recently and uses a 4 point walker. She fortunately has had very little A. fib and remains on low-dose amiodarone 200 mg daily.  PMHx:  Past Medical History  Diagnosis Date  . PAF (paroxysmal atrial fibrillation)   . CVA (cerebral infarction) 04/2009    LMCA & posterior cerebral - excellent recovery, mild expressive aphasa  . Hypertension   . History of nuclear stress test 06/2009    dipyridamole; negative, normal pattern of perfusion   . Gait disorder 04/03/2014  . Aphasia as late effect of stroke 04/03/2014  . Osteopenia   . Chronic kidney disease   . OA (osteoarthritis)   . Anxiety   . Vitamin D deficiency   . Valvular heart disease   . Multiple lung nodules   . Diverticulitis   . Nephrolithiasis   . Cervical spondylosis     Past Surgical History  Procedure Laterality Date  . Cataract extraction Bilateral 2013  . Replacement total knee Right 2004  . Appendectomy    . Abdominal hysterectomy  1975  . Instestinal surgery  1987    r/t blockage  . Nose surgery  1985    r/t deviated septum  . Transthoracic echocardiogram  09/19/2012    EF 55-60% with normal systolic function; mod AR; MVP with mild-mod regurg; LA severely dilated; RV systolic pressure increased; mod dilated RA; RV systolic pressure increased - mild pulm HTN     FAMHx:  Family History  Problem Relation Age of Onset  . Lymphoma Mother   . Aneurysm Other     grandmother, uncles, cousins  . Tuberculosis Father     SOCHx:   reports that she has never smoked. She has never used smokeless tobacco. She  reports that she does not drink alcohol or use illicit drugs.  ALLERGIES:  Allergies  Allergen Reactions  . Amitriptyline Hcl Itching  . Atenolol Other (See Comments)    Hair loss, thin nails, itching  . Azithromycin Other (See Comments)    Mouth sores; sore throat  . Cefuroxime   . Daypro [Oxaprozin] Other (See Comments)    dyspepsia  . Doxycycline Other (See Comments)    Mouth sores; sore throat  . Famvir [Famciclovir]   . Hyzaar [Losartan Potassium-Hctz] Other (See Comments)    Headache; tight face  . Iodine Other (See Comments)    Involuntary muscle contractions  . Metoprolol Tartrate Itching  . Relafen [Nabumetone] Other (See Comments)    Mouth sores  . Sterapred [Prednisone] Other (See Comments)    weakness    ROS: A comprehensive review of systems was negative.  HOME MEDS: Current Outpatient Prescriptions  Medication Sig Dispense Refill  . acetaminophen (TYLENOL) 500 MG tablet Take 500 mg by mouth every 6 (six) hours as needed for mild pain.    Marland Kitchen alendronate (FOSAMAX) 70 MG tablet Take 70 mg by mouth every 7 (seven) days. Take with a full glass of water on an empty stomach.    Marland Kitchen amiodarone (PACERONE) 200 MG tablet Take 1 tablet (200 mg total) by mouth daily. 30 tablet 5  . amLODipine (NORVASC) 10 MG tablet Take 10 mg by mouth at bedtime.     Marland Kitchen aspirin EC 81 MG tablet Take 81 mg by mouth daily.    Marland Kitchen levothyroxine (SYNTHROID, LEVOTHROID) 50 MCG tablet Take 50 mcg by mouth daily before breakfast.    . metoprolol succinate (TOPROL-XL) 25 MG 24 hr tablet Take 1 tablet (25 mg total) by mouth daily. 30 tablet 3  . Multiple Vitamins-Minerals (MULTIVITAMIN & MINERAL PO) Take 1 tablet by mouth daily.    . nitroGLYCERIN (NITROSTAT) 0.4 MG SL tablet Place 1 tablet (0.4 mg total) under the tongue every 5 (five) minutes as needed for chest pain. 30 tablet 12  . Omega-3 Fatty Acids (FISH OIL PO) Take 1 tablet by mouth at bedtime.     No current facility-administered medications  for this visit.    LABS/IMAGING: No results found for this or any previous visit (from the past 48 hour(s)). No results found.  VITALS: BP 140/62 mmHg  Pulse 72  Ht 4' 11.5" (1.511 m)  Wt 112 lb 14.4 oz (51.211 kg)  BMI 22.43 kg/m2  EXAM: General appearance: alert and no distress Neck: no adenopathy, no carotid bruit, no JVD, supple, symmetrical, trachea midline and thyroid not enlarged, symmetric, no tenderness/mass/nodules Lungs: clear to auscultation bilaterally Heart: regular rate and rhythm, S1, S2 normal and diastolic murmur: holodiastolic 2/6, blowing at 2nd right intercostal space Abdomen: soft, non-tender; bowel sounds normal; no masses,  no organomegaly Extremities: extremities normal, atraumatic, no cyanosis or edema Pulses: 2+ and symmetric Skin: Skin color, texture,  turgor normal. No rashes or lesions Neurologic: Grossly normal, mild expressive aphasia Psych: Mood, affect normal  EKG: Normal sinus rhythm at 72, first-degree AV block  ASSESSMENT: 1. Stable moderate aortic insufficiency 2. Paroxysmal atrial fibrillation - CHADSVASC 4- on aspirin and amiodarone 3. Hypertension-controlled 4. Dizziness with falls 5. Chronic subdural hematomas - warfarin is contraindicated  PLAN: 1.   Mrs. Watwood is doing fairly well and has not had any significant falls recently. She is maintaining sinus rhythm on amiodarone. Blood pressure is controlled. Recently her falls have decreased. We'll continue her current medications and plan to see her back annually or sooner as necessary.  Pixie Casino, MD, [facility] Attending Cardiologist [place] 01/28/2015, 5:40 PM

## 2015-02-04 ENCOUNTER — Other Ambulatory Visit: Payer: Self-pay | Admitting: Internal Medicine

## 2015-02-05 ENCOUNTER — Other Ambulatory Visit: Payer: Self-pay | Admitting: *Deleted

## 2015-02-05 NOTE — Telephone Encounter (Signed)
REFILL 

## 2015-06-24 ENCOUNTER — Emergency Department (HOSPITAL_COMMUNITY)
Admission: EM | Admit: 2015-06-24 | Discharge: 2015-06-24 | Disposition: A | Discharge status: Home / Self Care | Payer: Medicare Other | Attending: Emergency Medicine | Admitting: Emergency Medicine

## 2015-06-24 ENCOUNTER — Emergency Department (HOSPITAL_COMMUNITY): Payer: Medicare Other

## 2015-06-24 DIAGNOSIS — M79602 Pain in left arm: Secondary | ICD-10-CM | POA: Insufficient documentation

## 2015-06-24 DIAGNOSIS — Z8639 Personal history of other endocrine, nutritional and metabolic disease: Secondary | ICD-10-CM | POA: Diagnosis not present

## 2015-06-24 DIAGNOSIS — R079 Chest pain, unspecified: Secondary | ICD-10-CM | POA: Diagnosis not present

## 2015-06-24 DIAGNOSIS — I10 Essential (primary) hypertension: Secondary | ICD-10-CM | POA: Diagnosis not present

## 2015-06-24 DIAGNOSIS — I480 Paroxysmal atrial fibrillation: Secondary | ICD-10-CM | POA: Insufficient documentation

## 2015-06-24 DIAGNOSIS — M47812 Spondylosis without myelopathy or radiculopathy, cervical region: Secondary | ICD-10-CM | POA: Diagnosis not present

## 2015-06-24 DIAGNOSIS — Z8719 Personal history of other diseases of the digestive system: Secondary | ICD-10-CM | POA: Diagnosis not present

## 2015-06-24 DIAGNOSIS — Z7982 Long term (current) use of aspirin: Secondary | ICD-10-CM | POA: Diagnosis not present

## 2015-06-24 DIAGNOSIS — Z8673 Personal history of transient ischemic attack (TIA), and cerebral infarction without residual deficits: Secondary | ICD-10-CM | POA: Insufficient documentation

## 2015-06-24 DIAGNOSIS — Z79899 Other long term (current) drug therapy: Secondary | ICD-10-CM | POA: Insufficient documentation

## 2015-06-24 DIAGNOSIS — Z87442 Personal history of urinary calculi: Secondary | ICD-10-CM | POA: Insufficient documentation

## 2015-06-24 DIAGNOSIS — Z8659 Personal history of other mental and behavioral disorders: Secondary | ICD-10-CM | POA: Diagnosis not present

## 2015-06-24 LAB — TROPONIN I: Troponin I: <0.03 ng/mL (ref <0.031)

## 2015-06-24 LAB — CBC WITH DIFFERENTIAL/PLATELET
BASOS ABS: 0 10*3/uL (ref 0.0–0.1)
BASOS PCT: 0 %
EOS ABS: 0 10*3/uL (ref 0.0–0.7)
EOS PCT: 0 %
HCT: 36 % (ref 36.0–46.0)
HEMOGLOBIN: 12.3 g/dL (ref 12.0–15.0)
LYMPHS PCT: 7 %
Lymphs Abs: 0.6 10*3/uL — ABNORMAL LOW (ref 0.7–4.0)
MCH: 33.7 pg (ref 26.0–34.0)
MCHC: 34.2 g/dL (ref 30.0–36.0)
MCV: 98.6 fL (ref 78.0–100.0)
MONO ABS: 0.6 10*3/uL (ref 0.1–1.0)
Monocytes Relative: 7 %
Neutro Abs: 7.2 10*3/uL (ref 1.7–7.7)
Neutrophils Relative %: 86 %
Platelets: 225 10*3/uL (ref 150–400)
RBC: 3.65 MIL/uL — AB (ref 3.87–5.11)
RDW: 14.6 % (ref 11.5–15.5)
WBC: 8.5 10*3/uL (ref 4.0–10.5)

## 2015-06-24 LAB — BASIC METABOLIC PANEL
ANION GAP: 12 (ref 5–15)
BUN: 30 mg/dL — ABNORMAL HIGH (ref 6–20)
CALCIUM: 9.4 mg/dL (ref 8.9–10.3)
CHLORIDE: 104 mmol/L (ref 101–111)
CO2: 23 mmol/L (ref 22–32)
Creatinine, Ser: 1.58 mg/dL — ABNORMAL HIGH (ref 0.44–1.00)
GFR calc non Af Amer: 28 mL/min — ABNORMAL LOW (ref >60)
GFR, EST AFRICAN AMERICAN: 32 mL/min — AB (ref >60)
Glucose, Bld: 120 mg/dL — ABNORMAL HIGH (ref 65–99)
Potassium: 4.3 mmol/L (ref 3.5–5.1)
SODIUM: 139 mmol/L (ref 135–145)

## 2015-06-24 LAB — I-STAT TROPONIN, ED
TROPONIN I, POC: 0 ng/mL (ref 0.00–0.08)
TROPONIN I, POC: 0.02 ng/mL (ref 0.00–0.08)

## 2015-06-24 MED ORDER — ASPIRIN 81 MG PO CHEW: 324.0000 mg | CHEWABLE_TABLET | Freq: Once | ORAL | Status: DC

## 2015-06-24 MED ORDER — NITROGLYCERIN 0.4 MG SL SUBL: 0.4000 mg | SUBLINGUAL_TABLET | SUBLINGUAL | Status: DC | PRN

## 2015-06-24 NOTE — ED Provider Notes (Signed)
CSN: [id number]     Arrival date & time 06/24/15  1208 History   First MD Initiated Contact with Patient 06/24/15 1211     Chief Complaint  Patient presents with  . Chest Pain     (Consider location/radiation/quality/duration/timing/severity/associated sxs/prior Treatment) Patient is a 82 y.o. female presenting with chest pain.  Chest Pain Pain location:  L chest Pain quality: dull   Pain quality: not sharp   Pain radiates to:  Does not radiate Pain severity:  Moderate Onset quality:  Sudden Duration: this AM 730, just started having left arm pain. Timing:  Intermittent Progression:  Resolved Chronicity:  New Relieved by:  Nitroglycerin Associated symptoms: no abdominal pain, no back pain, no cough, no diaphoresis, no fatigue, no fever, no headache, no nausea, no numbness, no palpitations, no shortness of breath and not vomiting   Risk factors: high cholesterol and hypertension   Risk factors: no coronary artery disease, no diabetes mellitus, no prior DVT/PE and no smoking   dr Debara Pickett  Past Medical History  Diagnosis Date  . PAF (paroxysmal atrial fibrillation)   . CVA (cerebral infarction) 04/2009    LMCA & posterior cerebral - excellent recovery, mild expressive aphasa  . Hypertension   . History of nuclear stress test 06/2009    dipyridamole; negative, normal pattern of perfusion   . Gait disorder 04/03/2014  . Aphasia as late effect of stroke 04/03/2014  . Osteopenia   . Chronic kidney disease   . OA (osteoarthritis)   . Anxiety   . Vitamin D deficiency   . Valvular heart disease   . Multiple lung nodules   . Diverticulitis   . Nephrolithiasis   . Cervical spondylosis    Past Surgical History  Procedure Laterality Date  . Cataract extraction Bilateral 2013  . Replacement total knee Right 2004  . Appendectomy    . Abdominal hysterectomy  1975  . Instestinal surgery  1987    r/t blockage  . Nose surgery  1985    r/t deviated septum  . Transthoracic  echocardiogram  09/19/2012    EF 55-60% with normal systolic function; mod AR; MVP with mild-mod regurg; LA severely dilated; RV systolic pressure increased; mod dilated RA; RV systolic pressure increased - mild pulm HTN    Family History  Problem Relation Age of Onset  . Lymphoma Mother   . Aneurysm Other     grandmother, uncles, cousins  . Tuberculosis Father    Social History  Substance Use Topics  . Smoking status: Never Smoker   . Smokeless tobacco: Never Used  . Alcohol Use: No   OB History    No data available     Review of Systems  Constitutional: Negative for fever, diaphoresis and fatigue.  HENT: Negative for sore throat.   Eyes: Negative for visual disturbance.  Respiratory: Negative for cough and shortness of breath.   Cardiovascular: Positive for chest pain. Negative for palpitations.  Gastrointestinal: Negative for nausea, vomiting and abdominal pain.  Genitourinary: Negative for difficulty urinating.  Musculoskeletal: Negative for back pain and neck pain.  Skin: Negative for rash.  Neurological: Negative for syncope, numbness and headaches.      Allergies  Amitriptyline hcl; Atenolol; Azithromycin; Cefuroxime; Daypro; Doxycycline; Famvir; Hyzaar; Iodine; Metoprolol tartrate; Relafen; and Sterapred  Home Medications   Prior to Admission medications   Medication Sig Start Date End Date Taking? Authorizing Provider  acetaminophen (TYLENOL) 500 MG tablet Take 500 mg by mouth every 6 (six) hours as needed for  mild pain.   Yes Historical Provider, MD  amiodarone (PACERONE) 200 MG tablet TAKE 1 TABLET BY MOUTH EVERY DAY 02/05/15  Yes Pixie Casino, MD  amLODipine (NORVASC) 10 MG tablet Take 10 mg by mouth at bedtime.    Yes Historical Provider, MD  aspirin EC 81 MG tablet Take 81 mg by mouth daily.   Yes Historical Provider, MD  levothyroxine (SYNTHROID, LEVOTHROID) 50 MCG tablet Take 50 mcg by mouth daily before breakfast.   Yes Historical Provider, MD   metoprolol succinate (TOPROL-XL) 50 MG 24 hr tablet Take 50 mg by mouth daily. 05/26/15  Yes Historical Provider, MD  Multiple Vitamins-Minerals (MULTIVITAMIN & MINERAL PO) Take 1 tablet by mouth daily.   Yes Historical Provider, MD  nitroGLYCERIN (NITROSTAT) 0.4 MG SL tablet Place 1 tablet (0.4 mg total) under the tongue every 5 (five) minutes as needed for chest pain. 12/15/14  Yes Ripudeep Krystal Eaton, MD  Omega-3 Fatty Acids (FISH OIL PO) Take 1 tablet by mouth at bedtime.   Yes Historical Provider, MD   BP 116/72 mmHg  Pulse 54  Temp(Src) 98 F (36.7 C) (Oral)  Resp 17  Ht 5\' 3"  (1.6 m)  Wt 112 lb (50.803 kg)  BMI 19.84 kg/m2  SpO2 98% Physical Exam  Constitutional: She is oriented to person, place, and time. She appears well-developed and well-nourished. No distress.  HENT:  Head: Normocephalic and atraumatic.  Eyes: Conjunctivae and EOM are normal.  Neck: Normal range of motion.  Cardiovascular: Normal rate, regular rhythm, normal heart sounds and intact distal pulses.  Exam reveals no gallop and no friction rub.   No murmur heard. Pulmonary/Chest: Effort normal and breath sounds normal. No respiratory distress. She has no wheezes. She has no rales.  Abdominal: Soft. She exhibits no distension. There is no tenderness. There is no guarding.  Musculoskeletal: She exhibits no edema or tenderness.  Neurological: She is alert and oriented to person, place, and time.  Skin: Skin is warm and dry. No rash noted. She is not diaphoretic. No erythema.  Nursing note and vitals reviewed.   ED Course  Procedures (including critical care time) Labs Review Labs Reviewed  BASIC METABOLIC PANEL - Abnormal; Notable for the following:    Glucose, Bld 120 (*)    BUN 30 (*)    Creatinine, Ser 1.58 (*)    GFR calc non Af Amer 28 (*)    GFR calc Af Amer 32 (*)    All other components within normal limits  CBC WITH DIFFERENTIAL/PLATELET - Abnormal; Notable for the following:    RBC 3.65 (*)     Lymphs Abs 0.6 (*)    All other components within normal limits  TROPONIN I  I-STAT TROPOININ, ED  I-STAT TROPOININ, ED    Imaging Review Dg Chest 2 View  06/24/2015  CLINICAL DATA:  Chest pain for 1 day EXAM: CHEST  2 VIEW COMPARISON:  12/14/2014 FINDINGS: Upper normal heart size. Lungs are hyperaerated. There is lobulation of the posterior diaphragm on the lateral view related to a Bochdalek's hernia seen on a prior CT. No pleural effusion. No pneumothorax. IMPRESSION: No active cardiopulmonary disease.  Chronic changes. Electronically Signed   By: Marybelle Killings M.D.   On: 06/24/2015 13:21   I have personally reviewed and evaluated these images and lab results as part of my medical decision-making.   EKG Interpretation   Date/Time:  Tuesday June 24 2015 12:59:33 EST Ventricular Rate:  68 PR Interval:  181 QRS  Duration: 102 QT Interval:  453 QTC Calculation: 482 R Axis:   57 Text Interpretation:  Sinus  rhythm Borderline repolarization abnormality  No significant change since last tracing Confirmed by [facility] MD, Miho Monda  ([id number]) on 06/24/2015 11:12:15 PM      MDM   Final diagnoses:  Chest pain, unspecified chest pain type   82 year old female with history of CVA, mild aphasia, paroxysmal atrial fibrillation, hypertension presents with concern of chest pain. Differential diagnosis for chest pain includes pulmonary embolus, dissection, pneumothorax, pneumonia, ACS, myocarditis, pericarditis.  EKG was done and evaluate by me and showed no acute ST changes and no signs of pericarditis. Chest x-ray was done and evaluated by me and radiology and showed  no sign of pneumonia or pneumothorax.  Troponin negative. Given concern for symptoms and cardiac risk factors, consulted Cardiology. Cardiology evaluated the patient and recommended recheck of troponin and discharge if negative after discussion with pt of options.  I discussed option of admission with patient who declines and prefers  outpatient follow up and return if she develops new or worsening symptoms. Patient discharged in stable condition with understanding of reasons to return and will follow up with Cardiology.   Gareth Morgan, MD 06/24/15 [phone]

## 2015-06-24 NOTE — Consult Note (Signed)
CARDIOLOGY CONSULT NOTE   Patient ID: Madison Berg MRN: [id number] DOB/AGE: December 14, 1923 82 y.o.  Admit date: 06/24/2015  Primary Physician   Horatio Pel, MD Primary Cardiologist   Dr Debara Pickett Reason for Consultation   Chest pain  [id number] B Troxler is a 82 y.o. year old female with a history of CVA, PAF on amio, anticoag w/ ASA 81 mg only after fall w/ SDH on coumadin (CHADSVASC=6), AI w/ EF nl by echo 2014, MV 2011 OK.   Pt in usual [place] this am, had not gotten out of bed. Had dull 3/10 chest pain starting about 7:30 am, no radiation. No associated symptoms. She tried SL NTG (old) about 10:30. The pain was fleeting, lasting only a minute or 2, but she got multiple episodes. The 2nd NTG helped the pain, but gave her an all-over feeling she cannot describe. She took ASA as well. She called EMS and came to the ER.     She has had similar symptoms before, but they were brief enough that she did not require medications. They are not frequent, only about 1 x month. There is no association with exertion. She has no exertional symptoms, but her ambulation is limited by balance issues after her CVA. She is active around the house, doing housework and stays busy. She gets occasional palpitations, about once/month or so. She does not take rx for these, there is no presyncope or syncope. They resolve spontaneously, she had none today.  Past Medical History  Diagnosis Date  . PAF (paroxysmal atrial fibrillation)   . CVA (cerebral infarction) 04/2009    LMCA & posterior cerebral - excellent recovery, mild expressive aphasa  . Hypertension   . History of nuclear stress test 06/2009    dipyridamole; negative, normal pattern of perfusion   . Gait disorder 04/03/2014  . Aphasia as late effect of stroke 04/03/2014  . Osteopenia   . Chronic kidney disease   . OA (osteoarthritis)   . Anxiety   . Vitamin D deficiency   . Valvular heart disease   . Multiple lung nodules   .  Diverticulitis   . Nephrolithiasis   . Cervical spondylosis      Past Surgical History  Procedure Laterality Date  . Cataract extraction Bilateral 2013  . Replacement total knee Right 2004  . Appendectomy    . Abdominal hysterectomy  1975  . Instestinal surgery  1987    r/t blockage  . Nose surgery  1985    r/t deviated septum  . Transthoracic echocardiogram  09/19/2012    EF 55-60% with normal systolic function; mod AR; MVP with mild-mod regurg; LA severely dilated; RV systolic pressure increased; mod dilated RA; RV systolic pressure increased - mild pulm HTN     Allergies  Allergen Reactions  . Amitriptyline Hcl Itching  . Atenolol Other (See Comments)    Hair loss, thin nails, itching  . Azithromycin Other (See Comments)    Mouth sores; sore throat  . Cefuroxime   . Daypro [Oxaprozin] Other (See Comments)    dyspepsia  . Doxycycline Other (See Comments)    Mouth sores; sore throat  . Famvir [Famciclovir]   . Hyzaar [Losartan Potassium-Hctz] Other (See Comments)    Headache; tight face  . Iodine Other (See Comments)    Involuntary muscle contractions  . Metoprolol Tartrate Itching  . Relafen [Nabumetone] Other (See Comments)    Mouth sores  . Sterapred [Prednisone] Other (See Comments)    weakness  I have reviewed the patient's current medications . aspirin  324 mg Oral Once     nitroGLYCERIN  Prior to Admission medications   Medication Sig Start Date End Date Taking? Authorizing Provider  acetaminophen (TYLENOL) 500 MG tablet Take 500 mg by mouth every 6 (six) hours as needed for mild pain.   Yes Historical Provider, MD  amiodarone (PACERONE) 200 MG tablet TAKE 1 TABLET BY MOUTH EVERY DAY 02/05/15  Yes Pixie Casino, MD  amLODipine (NORVASC) 10 MG tablet Take 10 mg by mouth at bedtime.    Yes Historical Provider, MD  aspirin EC 81 MG tablet Take 81 mg by mouth daily.   Yes Historical Provider, MD  levothyroxine (SYNTHROID, LEVOTHROID) 50 MCG tablet Take 50  mcg by mouth daily before breakfast.   Yes Historical Provider, MD  metoprolol succinate (TOPROL-XL) 50 MG 24 hr tablet Take 50 mg by mouth daily. 05/26/15  Yes Historical Provider, MD  Multiple Vitamins-Minerals (MULTIVITAMIN & MINERAL PO) Take 1 tablet by mouth daily.   Yes Historical Provider, MD  nitroGLYCERIN (NITROSTAT) 0.4 MG SL tablet Place 1 tablet (0.4 mg total) under the tongue every 5 (five) minutes as needed for chest pain. 12/15/14  Yes Ripudeep Krystal Eaton, MD  Omega-3 Fatty Acids (FISH OIL PO) Take 1 tablet by mouth at bedtime.   Yes Historical Provider, MD     Social History   Social History  . Marital Status: Widowed    Spouse Name: N/A  . Number of Children: 4  . Years of Education: N/A   Occupational History  .      Retired   Social History Main Topics  . Smoking status: Never Smoker   . Smokeless tobacco: Never Used  . Alcohol Use: No  . Drug Use: No  . Sexual Activity: Not on file   Other Topics Concern  . Not on file   Social History Narrative   Patient is right handed.   No caffeine   Live at home with two sons   Education one year of college.   Retired.    Family Status  Relation Status Death Age  . Mother Deceased 38  . Father Deceased 52  . Brother Deceased 3 years    Meningitis   Family History  Problem Relation Age of Onset  . Lymphoma Mother   . Aneurysm Other     grandmother, uncles, cousins  . Tuberculosis Father      ROS:  Full 14 point review of systems complete and found to be negative unless listed above.  Physical Exam: Blood pressure 156/58, pulse 64, temperature 98 F (36.7 C), temperature source Oral, resp. rate 14, height 5\' 3"  (1.6 m), weight 112 lb (50.803 kg), SpO2 100 %.  General: Well developed, frail elderly, female in no acute distress Head: Eyes PERRLA, No xanthomas.   Normocephalic and atraumatic, oropharynx without edema or exudate. Dentition: poor  Lungs: few rales, good air exchange Heart: HRRR S1 S2, no  rub/gallop, very soft murmur. pulses are 2+ all 4 extrem.   Neck: No carotid bruits. No lymphadenopathy.  JVD not elevated. Abdomen: Bowel sounds present, abdomen soft and non-tender without masses or hernias noted. Msk:  No spine or cva tenderness. No weakness, no joint deformities or effusions. Extremities: No clubbing or cyanosis. No edema.  Neuro: Alert and oriented X 3. No focal deficits noted. Psych:  Good affect, responds appropriately Skin: No rashes or lesions noted.  Labs:   Lab Results  Component Value  Date   WBC 8.5 06/24/2015   HGB 12.3 06/24/2015   HCT 36.0 06/24/2015   MCV 98.6 06/24/2015   PLT 225 06/24/2015     Recent Labs Lab 06/24/15 1331  NA 139  K 4.3  CL 104  CO2 23  BUN 30*  CREATININE 1.58*  CALCIUM 9.4  GLUCOSE 120*     Recent Labs  06/24/15 1353  TROPIPOC 0.00   Echo: 12/15/2014 - Left ventricle: The cavity size was normal. Wall thickness was normal. Systolic function was normal. The estimated ejection fraction was in the range of 55% to 60%. Wall motion was normal; there were no regional wall motion abnormalities. - Aortic valve: There was trivial regurgitation. - Mitral valve: There was mild regurgitation. - Left atrium: The atrium was severely dilated. - Right atrium: The atrium was moderately to severely dilated. - Tricuspid valve: There was mild-moderate regurgitation. - Pulmonary arteries: Systolic pressure was mildly increased. PA peak pressure: 37 mm Hg (S).  ECG:  06/24/2015 SR, rate 68, no sig change from 12/2014 ECG  Radiology:  Dg Chest 2 View 06/24/2015  CLINICAL DATA:  Chest pain for 1 day EXAM: CHEST  2 VIEW COMPARISON:  12/14/2014 FINDINGS: Upper normal heart size. Lungs are hyperaerated. There is lobulation of the posterior diaphragm on the lateral view related to a Bochdalek's hernia seen on a prior CT. No pleural effusion. No pneumothorax. IMPRESSION: No active cardiopulmonary disease.  Chronic changes.  Electronically Signed   By: Marybelle Killings M.D.   On: 06/24/2015 13:21    ASSESSMENT AND PLAN:   The patient was seen today by Dr Angelena Form, the patient evaluated and the data reviewed.  Principal Problem: 1.  Chest pain, moderate coronary artery risk - symptoms resolved w/ SL NTG, though it was probably old.  - multiple CRFs but atypical sx and no hx exertional problems recently.  - pain began at 7:30 am. - MD advise on recheck troponin and d/c if OK   Signed: Lenoard Aden 06/24/2015 2:42 PM Beeper [id number]  I have personally seen and examined this patient with Rosaria Ferries, PA-C. I agree with the assessment and plan as outlined above. I have personally examined this patient. She is a pleasant elderly female with RRR, soft systolic murmur, clear lungs, no edema. EKG without ischemic changes. Troponin negative. Labs reviewed. Isolated episode of chest pain. This has resolved. I would not recommend further cardiac workup at this time. She can be discharged home and f/u with Dr. Debara Pickett as scheduled.   Taleah Bellantoni 06/24/2015 3:22 PM

## 2015-06-24 NOTE — ED Notes (Signed)
Per EMS- pt here from home, woke up with chest pain this morning to left chest. No associated symptoms. Pt took two of her at home nitro and 324 ASA prior to EMS arrival. Pain is now gone. 18G PIv to L FA placed by EMS.

## 2015-06-24 NOTE — ED Notes (Signed)
Cards at bedside

## 2015-07-02 ENCOUNTER — Ambulatory Visit (INDEPENDENT_AMBULATORY_CARE_PROVIDER_SITE_OTHER): Payer: Medicare Other | Admitting: Internal Medicine

## 2015-07-02 ENCOUNTER — Encounter: Payer: Self-pay | Admitting: Internal Medicine

## 2015-07-02 ENCOUNTER — Telehealth: Payer: Self-pay | Admitting: Internal Medicine

## 2015-07-02 VITALS — BP 160/50 | HR 64 | Ht 60.0 in | Wt 114.6 lb

## 2015-07-02 DIAGNOSIS — R072 Precordial pain: Secondary | ICD-10-CM

## 2015-07-02 DIAGNOSIS — I480 Paroxysmal atrial fibrillation: Secondary | ICD-10-CM

## 2015-07-02 MED ORDER — NITROGLYCERIN 0.4 MG SL SUBL: 0.4000 mg | SUBLINGUAL_TABLET | SUBLINGUAL | Status: AC | PRN

## 2015-07-02 NOTE — Progress Notes (Signed)
OFFICE NOTE  Chief Complaint:  Follow-up ER visit  Primary Care Physician: Horatio Pel, MD  HPI:  Madison Berg is a pleasant 82 year old female followed for years by Dr. [name] with a history of paroxysmal atrial fibrillation. She had a stroke in [id number] with an embolic event to her left middle cerebral artery and posterior cerebral artery. She recovered fairly well except she has some mild expressive aphasia. Chest is a history of aortic insufficiency which has been moderate and was recently stable on an echocardiogram in May of 2014. Her ejection fraction is preserved and she said no ischemia. She had a Myoview stress test in February 2011 which was negative. She is on warfarin therapy and her INRs have been therapeutic and stable. She has no complaints of chest pain or shortness of breath. She maintains on amiodarone for rhythm control with her atrial fibrillation. She had recent thyroid function which was in normal limits, but I do not see recent pulmonary function testing. She will need repeat of this testing at her next office visit. Her other main complaint today is problems with bladder incontinence. She previously had seen Dr. Elder Negus and he had advised against medications for incontinence, but her problems have gotten worse and she may be a good candidate for this medication. I understand that she may have had a bladder surgery in the past.  Madison Berg has no new complaints today. She denies any worsening shortness of breath or chest pain. She is again concerned about hair loss, however I do not appreciate any significant worsening of the thinning of her hair. She is asked whether it is okay for her to use biotin supplements. I do not have a problem with that. She has had a couple of more falls but is resistant to using a walker due to decreased mobility and independence. She is since stopped driving.  I saw Madison Berg back in the office today. Unfortunately since  I last saw her she continued to have gait instability and had numerous falls. Ultimately she developed a subdural hematoma and was felt to be a very poor candidate at that point for anticoagulation with warfarin. This was discontinued. She is maintained on aspirin.  Madison Berg is doing fairly well. She's had a couple of episodes of cerebral hemorrhage and was taken off of warfarin for this. She's had no falls recently and uses a 4 point walker. She fortunately has had very little A. fib and remains on low-dose amiodarone 200 mg daily.  I had the pleasure seeing Madison Berg back in the office today for follow-up. Recently she was seen in the ER for an episode of chest pain. She apparently took 2 nitroglycerin for this and her symptoms eventually resolved. She denies any burning under the tongue and therefore her medicine, which is probably quite old me not been effective. Since then she's had no further symptoms. She says she feels great today.  PMHx:  Past Medical History  Diagnosis Date  . PAF (paroxysmal atrial fibrillation) ([place])   . CVA (cerebral infarction) 04/2009    LMCA & posterior cerebral - excellent recovery, mild expressive aphasa  . Hypertension   . History of nuclear stress test 06/2009    dipyridamole; negative, normal pattern of perfusion   . Gait disorder 04/03/2014  . Aphasia as late effect of stroke 04/03/2014  . Osteopenia   . Chronic kidney disease   . OA (osteoarthritis)   . Anxiety   . Vitamin D deficiency   .  Valvular heart disease   . Multiple lung nodules   . Diverticulitis   . Nephrolithiasis   . Cervical spondylosis     Past Surgical History  Procedure Laterality Date  . Cataract extraction Bilateral 2013  . Replacement total knee Right 2004  . Appendectomy    . Abdominal hysterectomy  1975  . Instestinal surgery  1987    r/t blockage  . Nose surgery  1985    r/t deviated septum  . Transthoracic echocardiogram  09/19/2012    EF 55-60% with normal  systolic function; mod AR; MVP with mild-mod regurg; LA severely dilated; RV systolic pressure increased; mod dilated RA; RV systolic pressure increased - mild pulm HTN     FAMHx:  Family History  Problem Relation Age of Onset  . Lymphoma Mother   . Aneurysm Other     grandmother, uncles, cousins  . Tuberculosis Father     SOCHx:   reports that she has never smoked. She has never used smokeless tobacco. She reports that she does not drink alcohol or use illicit drugs.  ALLERGIES:  Allergies  Allergen Reactions  . Amitriptyline Hcl Itching  . Atenolol Other (See Comments)    Hair loss, thin nails, itching  . Azithromycin Other (See Comments)    Mouth sores; sore throat  . Cefuroxime   . Daypro [Oxaprozin] Other (See Comments)    dyspepsia  . Doxycycline Other (See Comments)    Mouth sores; sore throat  . Famvir [Famciclovir]   . Hyzaar [Losartan Potassium-Hctz] Other (See Comments)    Headache; tight face  . Iodine Other (See Comments)    Involuntary muscle contractions  . Metoprolol Tartrate Itching  . Relafen [Nabumetone] Other (See Comments)    Mouth sores  . Sterapred [Prednisone] Other (See Comments)    weakness    ROS: Pertinent items noted in HPI and remainder of comprehensive ROS otherwise negative.  HOME MEDS: Current Outpatient Prescriptions  Medication Sig Dispense Refill  . acetaminophen (TYLENOL) 500 MG tablet Take 500 mg by mouth every 6 (six) hours as needed for mild pain.    Marland Kitchen amiodarone (PACERONE) 200 MG tablet TAKE 1 TABLET BY MOUTH EVERY DAY 30 tablet 11  . amLODipine (NORVASC) 10 MG tablet Take 10 mg by mouth at bedtime.     Marland Kitchen aspirin EC 81 MG tablet Take 81 mg by mouth daily.    Marland Kitchen levothyroxine (SYNTHROID, LEVOTHROID) 50 MCG tablet Take 50 mcg by mouth daily before breakfast.    . metoprolol succinate (TOPROL-XL) 50 MG 24 hr tablet Take 50 mg by mouth daily.  8  . Multiple Vitamins-Minerals (MULTIVITAMIN & MINERAL PO) Take 1 tablet by mouth  daily.    . nitroGLYCERIN (NITROSTAT) 0.4 MG SL tablet Place 1 tablet (0.4 mg total) under the tongue every 5 (five) minutes as needed for chest pain. 25 tablet 3  . Omega-3 Fatty Acids (FISH OIL PO) Take 1 tablet by mouth at bedtime.     No current facility-administered medications for this visit.    LABS/IMAGING: No results found for this or any previous visit (from the past 48 hour(s)). No results found.  VITALS: BP 160/50 mmHg  Pulse 64  Ht 5' (1.524 m)  Wt 114 lb 9 oz (51.965 kg)  BMI 22.37 kg/m2  EXAM: General appearance: alert and no distress Neck: no adenopathy, no carotid bruit, no JVD, supple, symmetrical, trachea midline and thyroid not enlarged, symmetric, no tenderness/mass/nodules Lungs: clear to auscultation bilaterally Heart: regular rate and  rhythm, S1, S2 normal and diastolic murmur: holodiastolic 2/6, blowing at 2nd right intercostal space Abdomen: soft, non-tender; bowel sounds normal; no masses,  no organomegaly Extremities: extremities normal, atraumatic, no cyanosis or edema Pulses: 2+ and symmetric Skin: Skin color, texture, turgor normal. No rashes or lesions Neurologic: Grossly normal, mild expressive aphasia Psych: Mood, affect normal  EKG: I personally reviewed her hospital EKG, no ischemic changes were noted  ASSESSMENT: 1. Stable moderate aortic insufficiency 2. Paroxysmal atrial fibrillation - CHADSVASC 4- on aspirin and amiodarone 3. Hypertension-controlled 4. Dizziness with falls 5. Chronic subdural hematomas - warfarin is contraindicated 6. Chest pain  PLAN: 1.   Mrs. Bolter had a solitary episode of chest pain. This reportedly improved after 2 nitroglycerin however her medicine is quite old and has not been refilled for some time. She noted the medicine didn't burn under her tongue. The pain improved after 30-40 minutes. EKG and workup in the emergency department was negative. She was seen by one of my partners in the emergency department  and not felt to need any additional workup. I agree with this. We will refill her nitroglycerin to take as needed and I advised her to burn under her tongue when she uses it. It's likely she may have coronary artery disease given her age, but she is a poor candidate for catheterization and additional workup. If she has additional symptoms I would advocate for medical therapy.   Follow-up in 6 months.   Pixie Casino, MD, [facility] Attending Cardiologist [place] Oddis Westling 07/02/2015, 4:17 PM

## 2015-07-02 NOTE — Telephone Encounter (Signed)
Received records from [facility] for appointment on 07/02/15 with Dr Debara Pickett.  Records given to [facility] (medical records) for Dr Lysbeth Penner schedule on 07/02/15. lp

## 2015-07-02 NOTE — Patient Instructions (Signed)
Dr. Debara Pickett has refilled your nitroglycerin  We will send you a letter in the mail when it is time to come see Dr. Debara Pickett again in September.

## 2015-07-31 ENCOUNTER — Other Ambulatory Visit: Payer: Self-pay | Admitting: Internal Medicine

## 2015-07-31 NOTE — Telephone Encounter (Signed)
Rx(s) sent to pharmacy electronically.  

## 2015-08-05 ENCOUNTER — Encounter: Payer: Self-pay | Admitting: *Deleted

## 2015-08-06 ENCOUNTER — Encounter: Payer: Self-pay | Admitting: Neurology

## 2015-08-06 ENCOUNTER — Ambulatory Visit (INDEPENDENT_AMBULATORY_CARE_PROVIDER_SITE_OTHER): Payer: Medicare Other | Admitting: Neurology

## 2015-08-06 VITALS — BP 121/53 | HR 59 | Ht 60.0 in | Wt 116.5 lb

## 2015-08-06 DIAGNOSIS — I69320 Aphasia following cerebral infarction: Secondary | ICD-10-CM | POA: Diagnosis not present

## 2015-08-06 DIAGNOSIS — M545 Low back pain, unspecified: Secondary | ICD-10-CM

## 2015-08-06 DIAGNOSIS — R269 Unspecified abnormalities of gait and mobility: Secondary | ICD-10-CM | POA: Diagnosis not present

## 2015-08-06 NOTE — Progress Notes (Signed)
Reason for visit: Gait disorder  REALYNN Berg is an 82 y.o. female  History of present illness:  [name] is an 82 year old right-handed white female with a history of discomfort in the feet that has been present for over one year. The patient was seen last year around this time with burning sensations in the feet. Nerve conduction studies did not show definite evidence of a peripheral neuropathy, but the patient has been on amiodarone. The patient was felt to have a low-grade S1 radiculopathy, CT of the back showed bilateral S1 nerve root impingement. The patient has not required any medications for pain. She has had some progression of her symptoms with burning sensations up to the knees bilaterally, this is worse in the evening hours. The patient uses a walker for ambulation, she reports no recent falls. She continues to have a residual aphasia from her prior stroke event. The patient indicates that she is sleeping well. She returns for an evaluation. Blood work done by Dr. Shelia Media includes a TSH of 3.8, total T4 9 0.7 which is normal. A chemistry panel revealed BUN of 33 and creatinine 1.4 with a GFR of 33. White blood count was 6.6, hemoglobin 12.1. The patient has had an arterial Doppler study of the legs that appear to be relatively unremarkable.  Past Medical History  Diagnosis Date  . PAF (paroxysmal atrial fibrillation) ([place])   . CVA (cerebral infarction) 04/2009    LMCA & posterior cerebral - excellent recovery, mild expressive aphasa  . Hypertension   . History of nuclear stress test 06/2009    dipyridamole; negative, normal pattern of perfusion   . Gait disorder 04/03/2014  . Aphasia as late effect of stroke 04/03/2014  . Osteopenia   . Chronic kidney disease   . OA (osteoarthritis)   . Anxiety   . Vitamin D deficiency   . Valvular heart disease   . Multiple lung nodules   . Diverticulitis   . Nephrolithiasis   . Cervical spondylosis     Past Surgical History    Procedure Laterality Date  . Cataract extraction Bilateral 2013  . Replacement total knee Right 2004  . Appendectomy    . Abdominal hysterectomy  1975  . Instestinal surgery  1987    r/t blockage  . Nose surgery  1985    r/t deviated septum  . Transthoracic echocardiogram  09/19/2012    EF 55-60% with normal systolic function; mod AR; MVP with mild-mod regurg; LA severely dilated; RV systolic pressure increased; mod dilated RA; RV systolic pressure increased - mild pulm HTN     Family History  Problem Relation Age of Onset  . Lymphoma Mother   . Aneurysm Other     grandmother, uncles, cousins  . Tuberculosis Father     Social history:  reports that she has never smoked. She has never used smokeless tobacco. She reports that she does not drink alcohol or use illicit drugs.    Allergies  Allergen Reactions  . Amitriptyline Hcl Itching  . Atenolol Other (See Comments)    Hair loss, thin nails, itching  . Azithromycin Other (See Comments)    Mouth sores; sore throat  . Cefuroxime   . Daypro [Oxaprozin] Other (See Comments)    dyspepsia  . Doxycycline Other (See Comments)    Mouth sores; sore throat  . Famvir [Famciclovir]   . Hyzaar [Losartan Potassium-Hctz] Other (See Comments)    Headache; tight face  . Iodine Other (See Comments)  Involuntary muscle contractions  . Metoprolol Tartrate Itching  . Relafen [Nabumetone] Other (See Comments)    Mouth sores  . Sterapred [Prednisone] Other (See Comments)    weakness    Medications:  Prior to Admission medications   Medication Sig Start Date End Date Taking? Authorizing Provider  acetaminophen (TYLENOL) 500 MG tablet Take 500 mg by mouth every 6 (six) hours as needed for mild pain.   Yes Historical Provider, MD  alendronate (FOSAMAX) 70 MG tablet Take 70 mg by mouth once a week. Take with a full glass of water on an empty stomach.   Yes Historical Provider, MD  amiodarone (PACERONE) 200 MG tablet TAKE 1 TABLET BY MOUTH  EVERY DAY 02/05/15  Yes Pixie Casino, MD  amLODipine (NORVASC) 10 MG tablet Take 10 mg by mouth at bedtime.    Yes Historical Provider, MD  aspirin EC 81 MG tablet Take 81 mg by mouth daily.   Yes Historical Provider, MD  Calcium Carbonate-Vitamin D (CALCIUM-VITAMIN D) 500-200 MG-UNIT tablet Take 1 tablet by mouth daily.   Yes Historical Provider, MD  cetirizine (ZYRTEC) 10 MG tablet Take 10 mg by mouth daily as needed.    Yes Historical Provider, MD  Cholecalciferol 1000 units tablet Take 1,000 Units by mouth daily.   Yes Historical Provider, MD  levothyroxine (SYNTHROID, LEVOTHROID) 50 MCG tablet Take 50 mcg by mouth daily before breakfast.   Yes Historical Provider, MD  metoprolol succinate (TOPROL-XL) 50 MG 24 hr tablet TAKE 1 TABLET BY MOUTH EVERY DAY WITH OR IMMEDIATELY FOLLOWING A MEAL Patient taking differently: TAKE 1/2  TABLET BY MOUTH EVERY DAY WITH OR IMMEDIATELY FOLLOWING A MEAL 07/31/15  Yes Pixie Casino, MD  Multiple Vitamins-Minerals (MULTIVITAMIN & MINERAL PO) Take 1 tablet by mouth daily.   Yes Historical Provider, MD  nitroGLYCERIN (NITROSTAT) 0.4 MG SL tablet Place 1 tablet (0.4 mg total) under the tongue every 5 (five) minutes as needed for chest pain. 07/02/15  Yes Pixie Casino, MD  Omega-3 Fatty Acids (FISH OIL PO) Take 1 tablet by mouth at bedtime.   Yes Historical Provider, MD    ROS:  Out of a complete 14 system review of symptoms, the patient complains only of the following symptoms, and all other reviewed systems are negative.  Hearing loss, runny nose, drooling Cough, shortness of breath Leg swelling, heart murmur Incontinence of the bladder, frequency of urination, joint pain, joint swelling, walking difficulty Swollen lymph nodes, bruising easily, anemia Numbness, speech difficulty   Blood pressure 121/53, pulse 59, height 5' (1.524 m), weight 116 lb 8 oz (52.844 kg).  Physical Exam  General: The patient is alert and cooperative at the time of the  examination.  Skin: No significant peripheral edema is noted.   Neurologic Exam  Mental status: The patient is alert and oriented x 3 at the time of the examination. The patient has apparent normal recent and remote memory, with an apparently normal attention span and concentration ability.   Cranial nerves: Facial symmetry is present. Speech is aphasic, word finding problems noted. Extraocular movements are full. Visual fields are full.  Motor: The patient has good strength in all 4 extremities.  Sensory examination: Soft touch sensation is symmetric on the face, arms, and legs.There appears to be a stocking pattern pinprick sensory deficit in the distal two thirds of the legs below the knees.  Coordination: The patient has good finger-nose-finger and heel-to-shin bilaterally.  Gait and station: The patient has a wide-based, unsteady  gait. The patient has much better stride when she uses a walker, good stability. Tandem gait was not attempted. Romberg is negative.  Reflexes: Deep tendon reflexes are symmetric, the ankle jerk reflexes are depressed.   Assessment/Plan:  1. Dysesthesias of both lower extremities  2. Bilateral S1 radiculopathies   3. Gait disturbance   4. Cerebrovascular disease, residual aphasia   The patient is having some discomfort in the legs, clinically this sounds like a peripheral neuropathy. The patient may be evolving a neuropathy on the amiodarone, but this was not apparent on prior study one year ago. The patient does not wish to go on medications for the discomfort currently, she may benefit from gabapentin in the future. She will follow-up to this office in one year, sooner if needed. She is remaining relatively stable with the walker with ambulation.   Jill Alexanders MD 08/06/2015 6:39 PM  [facility] [address] [place], [place] [address]  Phone [phone] Fax [phone]

## 2015-08-06 NOTE — Patient Instructions (Signed)
Fall Prevention in the Home  Falls can cause injuries and can affect people from all age groups. There are many simple things that you can do to make your home safe and to help prevent falls. WHAT CAN I DO ON THE OUTSIDE OF MY HOME?  Regularly repair the edges of walkways and driveways and fix any cracks.  Remove high doorway thresholds.  Trim any shrubbery on the main path into your home.  Use bright outdoor lighting.  Clear walkways of debris and clutter, including tools and rocks.  Regularly check that handrails are securely fastened and in good repair. Both sides of any steps should have handrails.  Install guardrails along the edges of any raised decks or porches.  Have leaves, snow, and ice cleared regularly.  Use sand or salt on walkways during winter months.  In the garage, clean up any spills right away, including grease or oil spills. WHAT CAN I DO IN THE BATHROOM?  Use night lights.  Install grab bars by the toilet and in the tub and shower. Do not use towel bars as grab bars.  Use non-skid mats or decals on the floor of the tub or shower.  If you need to sit down while you are in the shower, use a plastic, non-slip stool..  Keep the floor dry. Immediately clean up any water that spills on the floor.  Remove soap buildup in the tub or shower on a regular basis.  Attach bath mats securely with double-sided non-slip rug tape.  Remove throw rugs and other tripping hazards from the floor. WHAT CAN I DO IN THE BEDROOM?  Use night lights.  Make sure that a bedside light is easy to reach.  Do not use oversized bedding that drapes onto the floor.  Have a firm chair that has side arms to use for getting dressed.  Remove throw rugs and other tripping hazards from the floor. WHAT CAN I DO IN THE KITCHEN?   Clean up any spills right away.  Avoid walking on wet floors.  Place frequently used items in easy-to-reach places.  If you need to reach for something  above you, use a sturdy step stool that has a grab bar.  Keep electrical cables out of the way.  Do not use floor polish or wax that makes floors slippery. If you have to use wax, make sure that it is non-skid floor wax.  Remove throw rugs and other tripping hazards from the floor. WHAT CAN I DO IN THE STAIRWAYS?  Do not leave any items on the stairs.  Make sure that there are handrails on both sides of the stairs. Fix handrails that are broken or loose. Make sure that handrails are as long as the stairways.  Check any carpeting to make sure that it is firmly attached to the stairs. Fix any carpet that is loose or worn.  Avoid having throw rugs at the top or bottom of stairways, or secure the rugs with carpet tape to prevent them from moving.  Make sure that you have a light switch at the top of the stairs and the bottom of the stairs. If you do not have them, have them installed. WHAT ARE SOME OTHER FALL PREVENTION TIPS?  Wear closed-toe shoes that fit well and support your feet. Wear shoes that have rubber soles or low heels.  When you use a stepladder, make sure that it is completely opened and that the sides are firmly locked. Have someone hold the ladder while you   are using it. Do not climb a closed stepladder.  Add color or contrast paint or tape to grab bars and handrails in your home. Place contrasting color strips on the first and last steps.  Use mobility aids as needed, such as canes, walkers, scooters, and crutches.  Turn on lights if it is dark. Replace any light bulbs that burn out.  Set up furniture so that there are clear paths. Keep the furniture in the same spot.  Fix any uneven floor surfaces.  Choose a carpet design that does not hide the edge of steps of a stairway.  Be aware of any and all pets.  Review your medicines with your healthcare provider. Some medicines can cause dizziness or changes in blood pressure, which increase your risk of falling. Talk  with your health care provider about other ways that you can decrease your risk of falls. This may include working with a physical therapist or trainer to improve your strength, balance, and endurance.   This information is not intended to replace advice given to you by your health care provider. Make sure you discuss any questions you have with your health care provider.   Document Released: 04/09/2002 Document Revised: 09/03/2014 Document Reviewed: 05/24/2014 Elsevier Interactive Patient Education 2016 [employer]  

## 2015-10-09 ENCOUNTER — Emergency Department (HOSPITAL_COMMUNITY)
Admission: EM | Admit: 2015-10-09 | Discharge: 2015-10-10 | Disposition: A | Discharge status: Home / Self Care | Payer: Medicare Other | Attending: Emergency Medicine | Admitting: Emergency Medicine

## 2015-10-09 ENCOUNTER — Other Ambulatory Visit: Payer: Self-pay

## 2015-10-09 ENCOUNTER — Encounter (HOSPITAL_COMMUNITY): Payer: Self-pay

## 2015-10-09 ENCOUNTER — Emergency Department (HOSPITAL_COMMUNITY): Payer: Medicare Other

## 2015-10-09 DIAGNOSIS — I129 Hypertensive chronic kidney disease with stage 1 through stage 4 chronic kidney disease, or unspecified chronic kidney disease: Secondary | ICD-10-CM | POA: Diagnosis not present

## 2015-10-09 DIAGNOSIS — Z8673 Personal history of transient ischemic attack (TIA), and cerebral infarction without residual deficits: Secondary | ICD-10-CM | POA: Diagnosis not present

## 2015-10-09 DIAGNOSIS — N189 Chronic kidney disease, unspecified: Secondary | ICD-10-CM | POA: Diagnosis not present

## 2015-10-09 DIAGNOSIS — Z7982 Long term (current) use of aspirin: Secondary | ICD-10-CM | POA: Diagnosis not present

## 2015-10-09 DIAGNOSIS — R011 Cardiac murmur, unspecified: Secondary | ICD-10-CM | POA: Diagnosis not present

## 2015-10-09 DIAGNOSIS — Z79899 Other long term (current) drug therapy: Secondary | ICD-10-CM | POA: Insufficient documentation

## 2015-10-09 DIAGNOSIS — R42 Dizziness and giddiness: Secondary | ICD-10-CM | POA: Insufficient documentation

## 2015-10-09 DIAGNOSIS — H81399 Other peripheral vertigo, unspecified ear: Secondary | ICD-10-CM

## 2015-10-09 DIAGNOSIS — Z96651 Presence of right artificial knee joint: Secondary | ICD-10-CM | POA: Diagnosis not present

## 2015-10-09 LAB — CBC
HEMATOCRIT: 34.8 % — AB (ref 36.0–46.0)
Hemoglobin: 11.4 g/dL — ABNORMAL LOW (ref 12.0–15.0)
MCH: 31.6 pg (ref 26.0–34.0)
MCHC: 32.8 g/dL (ref 30.0–36.0)
MCV: 96.4 fL (ref 78.0–100.0)
PLATELETS: 256 10*3/uL (ref 150–400)
RBC: 3.61 MIL/uL — ABNORMAL LOW (ref 3.87–5.11)
RDW: 14.7 % (ref 11.5–15.5)
WBC: 6.7 10*3/uL (ref 4.0–10.5)

## 2015-10-09 LAB — URINALYSIS, ROUTINE W REFLEX MICROSCOPIC
Bilirubin Urine: NEGATIVE
GLUCOSE, UA: NEGATIVE mg/dL
HGB URINE DIPSTICK: NEGATIVE
KETONES UR: NEGATIVE mg/dL
Nitrite: NEGATIVE
PH: 5.5 (ref 5.0–8.0)
PROTEIN: NEGATIVE mg/dL
Specific Gravity, Urine: 1.018 (ref 1.005–1.030)

## 2015-10-09 LAB — URINE MICROSCOPIC-ADD ON: RBC / HPF: NONE SEEN RBC/hpf (ref 0–5)

## 2015-10-09 LAB — BASIC METABOLIC PANEL
Anion gap: 9 (ref 5–15)
BUN: 38 mg/dL — AB (ref 6–20)
CHLORIDE: 106 mmol/L (ref 101–111)
CO2: 21 mmol/L — AB (ref 22–32)
CREATININE: 1.55 mg/dL — AB (ref 0.44–1.00)
Calcium: 9 mg/dL (ref 8.9–10.3)
GFR calc Af Amer: 33 mL/min — ABNORMAL LOW (ref >60)
GFR calc non Af Amer: 29 mL/min — ABNORMAL LOW (ref >60)
GLUCOSE: 109 mg/dL — AB (ref 65–99)
Potassium: 4.9 mmol/L (ref 3.5–5.1)
SODIUM: 136 mmol/L (ref 135–145)

## 2015-10-09 LAB — CBG MONITORING, ED: Glucose-Capillary: 109 mg/dL — ABNORMAL HIGH (ref 65–99)

## 2015-10-09 NOTE — ED Notes (Addendum)
Patient in MRI 

## 2015-10-09 NOTE — ED Notes (Signed)
Pt from home by [facility]. Pt  has been having increased dizzy spells over the last 2 weeks. Increased dizziness today no other complaints.

## 2015-10-09 NOTE — ED Notes (Signed)
Dr. Winfred Leeds at bedside  Pt ambulated in room without difficulty.

## 2015-10-09 NOTE — Discharge Instructions (Signed)
Dizziness If dizziness worsens or recurs call Dr.Pharr or your neurologist, or return if concerned for any reason. Dizziness is a common problem. It makes you feel unsteady or lightheaded. You may feel like you are about to pass out (faint). Dizziness can lead to injury if you stumble or fall. Anyone can get dizzy, but dizziness is more common in older adults. This condition can be caused by a number of things, including:  Medicines.  Dehydration.  Illness. HOME CARE Following these instructions may help with your condition: Eating and Drinking  Drink enough fluid to keep your pee (urine) clear or pale yellow. This helps to keep you from getting dehydrated. Try to drink more clear fluids, such as water.  Do not drink alcohol.  Limit how much caffeine you drink or eat if told by your doctor.  Limit how much salt you drink or eat if told by your doctor. Activity  Avoid making quick movements.  When you stand up from sitting in a chair, steady yourself until you feel okay.  In the morning, first sit up on the side of the bed. When you feel okay, stand slowly while you hold onto something. Do this until you know that your balance is fine.  Move your legs often if you need to stand in one place for a long time. Tighten and relax your muscles in your legs while you are standing.  Do not drive or use heavy machinery if you feel dizzy.  Avoid bending down if you feel dizzy. Place items in your home so that they are easy for you to reach without leaning over. Lifestyle  Do not use any tobacco products, including cigarettes, chewing tobacco, or electronic cigarettes. If you need help quitting, ask your doctor.  Try to lower your stress level, such as with yoga or meditation. Talk with your doctor if you need help. General Instructions  Watch your dizziness for any changes.  Take medicines only as told by your doctor. Talk with your doctor if you think that your dizziness is caused by  a medicine that you are taking.  Tell a friend or a family member that you are feeling dizzy. If he or she notices any changes in your behavior, have this person call your doctor.  Keep all follow-up visits as told by your doctor. This is important. GET HELP IF:  Your dizziness does not go away.  Your dizziness or light-headedness gets worse.  You feel sick to your stomach (nauseous).  You have trouble hearing.  You have new symptoms.  You are unsteady on your feet or you feel like the room is spinning. GET HELP RIGHT AWAY IF:  You throw up (vomit) or have diarrhea and are unable to eat or drink anything.  You have trouble:  Talking.  Walking.  Swallowing.  Using your arms, hands, or legs.  You feel generally weak.  You are not thinking clearly or you have trouble forming sentences. It may take a friend or family member to notice this.  You have:  Chest pain.  Pain in your belly (abdomen).  Shortness of breath.  Sweating.  Your vision changes.  You are bleeding.  You have a headache.  You have neck pain or a stiff neck.  You have a fever.   This information is not intended to replace advice given to you by your health care provider. Make sure you discuss any questions you have with your health care provider.   Document Released: 04/08/2011 Document  Revised: 09/03/2014 Document Reviewed: 04/15/2014 Elsevier Interactive Patient Education [employer].

## 2015-10-09 NOTE — ED Notes (Signed)
Patient transported to MRI 

## 2015-10-09 NOTE — ED Provider Notes (Signed)
CSN: [id number]     Arrival date & time 10/09/15  1923 History   First MD Initiated Contact with Patient 10/09/15 2007     Chief Complaint  Patient presents with  . Dizziness     (Consider location/radiation/quality/duration/timing/severity/associated sxs/prior Treatment) HPI Complaint of dizziness i.e. sensation of room spinning onset earlier today. Lasting less than 5 minutes. No associated nausea no visual changes no change in speech. No focal numbness or weakness. She is presently asymptomatic without treatment. Nothing makes symptoms better or worse is presently asymptomatic Past Medical History  Diagnosis Date  . PAF (paroxysmal atrial fibrillation) ([place])   . CVA (cerebral infarction) 04/2009    LMCA & posterior cerebral - excellent recovery, mild expressive aphasa  . Hypertension   . History of nuclear stress test 06/2009    dipyridamole; negative, normal pattern of perfusion   . Gait disorder 04/03/2014  . Aphasia as late effect of stroke 04/03/2014  . Osteopenia   . Chronic kidney disease   . OA (osteoarthritis)   . Anxiety   . Vitamin D deficiency   . Valvular heart disease   . Multiple lung nodules   . Diverticulitis   . Nephrolithiasis   . Cervical spondylosis   Multiple strokes, subdural hematoma Past Surgical History  Procedure Laterality Date  . Cataract extraction Bilateral 2013  . Replacement total knee Right 2004  . Appendectomy    . Abdominal hysterectomy  1975  . Instestinal surgery  1987    r/t blockage  . Nose surgery  1985    r/t deviated septum  . Transthoracic echocardiogram  09/19/2012    EF 55-60% with normal systolic function; mod AR; MVP with mild-mod regurg; LA severely dilated; RV systolic pressure increased; mod dilated RA; RV systolic pressure increased - mild pulm HTN    Family History  Problem Relation Age of Onset  . Lymphoma Mother   . Aneurysm Other     grandmother, uncles, cousins  . Tuberculosis Father    Social History   Substance Use Topics  . Smoking status: Never Smoker   . Smokeless tobacco: Never Used  . Alcohol Use: No   OB History    No data available     Review of Systems  Constitutional: Negative.   HENT: Negative.   Respiratory: Negative.   Cardiovascular: Negative.   Gastrointestinal: Negative.   Musculoskeletal: Positive for gait problem.       Walks with walker  Skin: Negative.   Neurological: Positive for dizziness and speech difficulty.       Dizziness resolved. Chronic speech impediment after stroke   Psychiatric/Behavioral: Negative.   All other systems reviewed and are negative.     Allergies  Amitriptyline hcl; Atenolol; Azithromycin; Cefuroxime; Daypro; Doxycycline; Famvir; Hyzaar; Iodine; Metoprolol tartrate; Relafen; and Sterapred  Home Medications   Prior to Admission medications   Medication Sig Start Date End Date Taking? Authorizing Provider  acetaminophen (TYLENOL) 500 MG tablet Take 500 mg by mouth every 6 (six) hours as needed for mild pain.    Historical Provider, MD  alendronate (FOSAMAX) 70 MG tablet Take 70 mg by mouth once a week. Take with a full glass of water on an empty stomach.    Historical Provider, MD  amiodarone (PACERONE) 200 MG tablet TAKE 1 TABLET BY MOUTH EVERY DAY 02/05/15   Pixie Casino, MD  amLODipine (NORVASC) 10 MG tablet Take 10 mg by mouth at bedtime.     Historical Provider, MD  aspirin EC 81  MG tablet Take 81 mg by mouth daily.    Historical Provider, MD  Calcium Carbonate-Vitamin D (CALCIUM-VITAMIN D) 500-200 MG-UNIT tablet Take 1 tablet by mouth daily.    Historical Provider, MD  cetirizine (ZYRTEC) 10 MG tablet Take 10 mg by mouth daily as needed.     Historical Provider, MD  Cholecalciferol 1000 units tablet Take 1,000 Units by mouth daily.    Historical Provider, MD  levothyroxine (SYNTHROID, LEVOTHROID) 50 MCG tablet Take 50 mcg by mouth daily before breakfast.    Historical Provider, MD  metoprolol succinate (TOPROL-XL) 50 MG  24 hr tablet TAKE 1 TABLET BY MOUTH EVERY DAY WITH OR IMMEDIATELY FOLLOWING A MEAL Patient taking differently: TAKE 1/2  TABLET BY MOUTH EVERY DAY WITH OR IMMEDIATELY FOLLOWING A MEAL 07/31/15   Pixie Casino, MD  Multiple Vitamins-Minerals (MULTIVITAMIN & MINERAL PO) Take 1 tablet by mouth daily.    Historical Provider, MD  nitroGLYCERIN (NITROSTAT) 0.4 MG SL tablet Place 1 tablet (0.4 mg total) under the tongue every 5 (five) minutes as needed for chest pain. 07/02/15   Pixie Casino, MD  Omega-3 Fatty Acids (FISH OIL PO) Take 1 tablet by mouth at bedtime.    Historical Provider, MD   BP 155/55 mmHg  Pulse 63  Temp(Src) 98.3 F (36.8 C) (Oral)  Resp 16  SpO2 98% Physical Exam  Constitutional: No distress.  Chronically ill-appearing  HENT:  Head: Normocephalic and atraumatic.  Eyes: Conjunctivae are normal. Pupils are equal, round, and reactive to light.  Neck: Neck supple. No tracheal deviation present. No thyromegaly present.  Cardiovascular: Normal rate and regular rhythm.   Murmur heard. Pulmonary/Chest: Effort normal and breath sounds normal.  Abdominal: Soft. Bowel sounds are normal. She exhibits no distension. There is no tenderness.  Musculoskeletal: Normal range of motion. She exhibits no edema or tenderness.  Neurological: She is alert. No cranial nerve deficit. Coordination normal.  Speech slightly slurred. Baseline per her sons who accompany her walks with minimal assistance. Baseline per her sons DTRs symmetric bilaterally at knee jerk ankle jerk and biceps toes downward going bilaterally  Skin: Skin is warm and dry. No rash noted.  Psychiatric: She has a normal mood and affect.  Nursing note and vitals reviewed.   ED Course  Procedures (including critical care time) Labs Review Labs Reviewed  BASIC METABOLIC PANEL  CBC  URINALYSIS, ROUTINE W REFLEX MICROSCOPIC (NOT AT [facility])  CBG MONITORING, ED    Imaging Review No results found. I have personally reviewed  and evaluated these images and lab results as part of my medical decision-making.   EKG Interpretation   Date/Time:  Thursday October 09 2015 19:36:24 EDT Ventricular Rate:  63 PR Interval:  222 QRS Duration: 82 QT Interval:  468 QTC Calculation: 478 R Axis:   73 Text Interpretation:  Sinus rhythm with 1st degree A-V block Otherwise  normal ECG No significant change since last tracing Confirmed by  Winfred Leeds  MD, Athel Merriweather [id number]) on 10/09/2015 8:08:48 PM     11:35 PM patient asymptomatic. Resting comfortably. No distress. Appears at baseline per family. Results for orders placed or performed during the hospital encounter of [id number]  Basic metabolic panel  Result Value Ref Range   Sodium 136 135 - 145 mmol/L   Potassium 4.9 3.5 - 5.1 mmol/L   Chloride 106 101 - 111 mmol/L   CO2 21 (L) 22 - 32 mmol/L   Glucose, Bld 109 (H) 65 - 99 mg/dL   BUN 38 (  H) 6 - 20 mg/dL   Creatinine, Ser 1.55 (H) 0.44 - 1.00 mg/dL   Calcium 9.0 8.9 - 10.3 mg/dL   GFR calc non Af Amer 29 (L) >60 mL/min   GFR calc Af Amer 33 (L) >60 mL/min   Anion gap 9 5 - 15  CBC  Result Value Ref Range   WBC 6.7 4.0 - 10.5 K/uL   RBC 3.61 (L) 3.87 - 5.11 MIL/uL   Hemoglobin 11.4 (L) 12.0 - 15.0 g/dL   HCT 34.8 (L) 36.0 - 46.0 %   MCV 96.4 78.0 - 100.0 fL   MCH 31.6 26.0 - 34.0 pg   MCHC 32.8 30.0 - 36.0 g/dL   RDW 14.7 11.5 - 15.5 %   Platelets 256 150 - 400 K/uL  Urinalysis, Routine w reflex microscopic  Result Value Ref Range   Color, Urine YELLOW YELLOW   APPearance CLEAR CLEAR   Specific Gravity, Urine 1.018 1.005 - 1.030   pH 5.5 5.0 - 8.0   Glucose, UA NEGATIVE NEGATIVE mg/dL   Hgb urine dipstick NEGATIVE NEGATIVE   Bilirubin Urine NEGATIVE NEGATIVE   Ketones, ur NEGATIVE NEGATIVE mg/dL   Protein, ur NEGATIVE NEGATIVE mg/dL   Nitrite NEGATIVE NEGATIVE   Leukocytes, UA SMALL (A) NEGATIVE  Urine microscopic-add on  Result Value Ref Range   Squamous Epithelial / LPF 0-5 (A) NONE SEEN   WBC, UA 6-30 0 - 5  WBC/hpf   RBC / HPF NONE SEEN 0 - 5 RBC/hpf   Bacteria, UA RARE (A) NONE SEEN   Casts HYALINE CASTS (A) NEGATIVE  CBG monitoring, ED  Result Value Ref Range   Glucose-Capillary 109 (H) 65 - 99 mg/dL   Mr Brain Wo Contrast  10/09/2015  CLINICAL DATA:  Increased dizzy spells over the past 2 weeks. Confused and disoriented. EXAM: MRI HEAD WITHOUT CONTRAST TECHNIQUE: Multiplanar, multiecho pulse sequences of the brain and surrounding structures were obtained without intravenous contrast. COMPARISON:  05/02/2014 CT head.  MRI brain 03/24/2009. FINDINGS: No evidence for acute infarction, hemorrhage, mass lesion, hydrocephalus, or extra-axial fluid. Generalized atrophy. Chronic microvascular ischemic change. Large remote LEFT hemisphere infarct, with gliosis and encephalomalacia affecting the posterior frontal and posterior temporal lobe particularly. Flow voids are preserved, but there is marked dolichoectasia of the LEFT vertebral and basilar. Normal pituitary and cerebellar tonsils. Cervical spondylosis. Extracranial soft tissues unremarkable. Chronic sinus disease. BILATERAL cataract extraction. IMPRESSION: No acute intracranial abnormality. Atrophy and small vessel disease. Remote LEFT MCA territory infarction, nonhemorrhagic. Electronically Signed   By: Staci Righter M.D.   On: 10/09/2015 21:50    MDM  MRI of brain ordered in light of history of strokes. Renal insufficiency is chronic Final diagnoses:  None  Plan no further treatment needed. Patient had vertigo today. Which is brief and self-limiting. Will not prescribe medications. If symptoms continue she can follow up with Dr. Shelia Media or her neurologist #1Diagnoses vertigo #2 chronic renal insufficiency      Orlie Dakin, MD 10/09/15 2343

## 2015-11-06 ENCOUNTER — Ambulatory Visit: Payer: Medicare Other | Admitting: Neurology

## 2015-11-10 ENCOUNTER — Encounter (HOSPITAL_COMMUNITY): Payer: Self-pay | Admitting: Emergency Medicine

## 2015-11-10 ENCOUNTER — Ambulatory Visit: Payer: Medicare Other | Admitting: Neurology

## 2015-11-10 ENCOUNTER — Emergency Department (HOSPITAL_COMMUNITY)
Admission: EM | Admit: 2015-11-10 | Discharge: 2015-11-10 | Disposition: A | Discharge status: Home / Self Care | Payer: Medicare Other | Attending: Emergency Medicine | Admitting: Emergency Medicine

## 2015-11-10 ENCOUNTER — Emergency Department (HOSPITAL_COMMUNITY): Payer: Medicare Other

## 2015-11-10 ENCOUNTER — Other Ambulatory Visit: Payer: Self-pay

## 2015-11-10 DIAGNOSIS — Z7982 Long term (current) use of aspirin: Secondary | ICD-10-CM | POA: Insufficient documentation

## 2015-11-10 DIAGNOSIS — Y929 Unspecified place or not applicable: Secondary | ICD-10-CM | POA: Insufficient documentation

## 2015-11-10 DIAGNOSIS — R079 Chest pain, unspecified: Secondary | ICD-10-CM | POA: Diagnosis present

## 2015-11-10 DIAGNOSIS — I129 Hypertensive chronic kidney disease with stage 1 through stage 4 chronic kidney disease, or unspecified chronic kidney disease: Secondary | ICD-10-CM | POA: Insufficient documentation

## 2015-11-10 DIAGNOSIS — Y999 Unspecified external cause status: Secondary | ICD-10-CM | POA: Diagnosis not present

## 2015-11-10 DIAGNOSIS — Z96651 Presence of right artificial knee joint: Secondary | ICD-10-CM | POA: Insufficient documentation

## 2015-11-10 DIAGNOSIS — W19XXXA Unspecified fall, initial encounter: Secondary | ICD-10-CM | POA: Diagnosis not present

## 2015-11-10 DIAGNOSIS — N189 Chronic kidney disease, unspecified: Secondary | ICD-10-CM | POA: Diagnosis not present

## 2015-11-10 DIAGNOSIS — Z8673 Personal history of transient ischemic attack (TIA), and cerebral infarction without residual deficits: Secondary | ICD-10-CM | POA: Insufficient documentation

## 2015-11-10 DIAGNOSIS — R0789 Other chest pain: Secondary | ICD-10-CM | POA: Insufficient documentation

## 2015-11-10 DIAGNOSIS — Y939 Activity, unspecified: Secondary | ICD-10-CM | POA: Insufficient documentation

## 2015-11-10 LAB — BASIC METABOLIC PANEL
Anion gap: 11 (ref 5–15)
BUN: 29 mg/dL — AB (ref 6–20)
CALCIUM: 9.5 mg/dL (ref 8.9–10.3)
CO2: 22 mmol/L (ref 22–32)
CREATININE: 1.55 mg/dL — AB (ref 0.44–1.00)
Chloride: 105 mmol/L (ref 101–111)
GFR calc Af Amer: 33 mL/min — ABNORMAL LOW (ref >60)
GFR, EST NON AFRICAN AMERICAN: 29 mL/min — AB (ref >60)
Glucose, Bld: 112 mg/dL — ABNORMAL HIGH (ref 65–99)
POTASSIUM: 4.3 mmol/L (ref 3.5–5.1)
SODIUM: 138 mmol/L (ref 135–145)

## 2015-11-10 LAB — CBC
HCT: 37.1 % (ref 36.0–46.0)
Hemoglobin: 12.3 g/dL (ref 12.0–15.0)
MCH: 32.8 pg (ref 26.0–34.0)
MCHC: 33.2 g/dL (ref 30.0–36.0)
MCV: 98.9 fL (ref 78.0–100.0)
PLATELETS: 250 10*3/uL (ref 150–400)
RBC: 3.75 MIL/uL — ABNORMAL LOW (ref 3.87–5.11)
RDW: 15 % (ref 11.5–15.5)
WBC: 7.1 10*3/uL (ref 4.0–10.5)

## 2015-11-10 LAB — I-STAT TROPONIN, ED
TROPONIN I, POC: 0 ng/mL (ref 0.00–0.08)
Troponin i, poc: 0 ng/mL (ref 0.00–0.08)

## 2015-11-10 LAB — D-DIMER, QUANTITATIVE (NOT AT ARMC): D DIMER QUANT: 0.77 ug{FEU}/mL — AB (ref 0.00–0.50)

## 2015-11-10 MED ORDER — BENZONATATE 100 MG PO CAPS: 100.0000 mg | ORAL_CAPSULE | Freq: Two times a day (BID) | ORAL | Status: DC | PRN

## 2015-11-10 MED ORDER — ACETAMINOPHEN 500 MG PO TABS
500.0000 mg | ORAL_TABLET | Freq: Once | ORAL | Status: AC
  Administered 2015-11-10: 500 mg via ORAL
  Filled 2015-11-10: qty 1

## 2015-11-10 NOTE — Discharge Instructions (Signed)

## 2015-11-10 NOTE — ED Notes (Signed)
Pt. Stated, I've had chest pain on the right side since 2 days after the 4th.  I took 2 Nitroglcerin this morning around 830.  I did fall 4 days ago.  Alert and oriented x 4

## 2015-11-10 NOTE — ED Provider Notes (Signed)
CSN: [id number]     Arrival date & time 11/10/15  1035 History   First MD Initiated Contact with Patient 11/10/15 1106     Chief Complaint  Patient presents with  . Chest Pain  . Fall     Patient is a 82 y.o. female presenting with chest pain and fall. The history is provided by the patient. No language interpreter was used.  Chest Pain Fall Associated symptoms include chest pain.   Madison Berg is a 82 y.o. female who presents to the Emergency Department complaining of chest pain.  She reports 3-4 days of left-sided chest pain. Pain is difficult to describe but described as a pain sensation. It is worse with coughing spells as well as deep breaths. She has occasional shortness of breath when the pain is present when she has coughing spells. No fevers, abdominal pain, vomiting, leg swelling. Symptoms are moderate and worsening.Patient tried nitroglycerin at home with no relief in symptoms.  Past Medical History  Diagnosis Date  . PAF (paroxysmal atrial fibrillation) ([place])   . CVA (cerebral infarction) 04/2009    LMCA & posterior cerebral - excellent recovery, mild expressive aphasa  . Hypertension   . History of nuclear stress test 06/2009    dipyridamole; negative, normal pattern of perfusion   . Gait disorder 04/03/2014  . Aphasia as late effect of stroke 04/03/2014  . Osteopenia   . Chronic kidney disease   . OA (osteoarthritis)   . Anxiety   . Vitamin D deficiency   . Valvular heart disease   . Multiple lung nodules   . Diverticulitis   . Nephrolithiasis   . Cervical spondylosis    Past Surgical History  Procedure Laterality Date  . Cataract extraction Bilateral 2013  . Replacement total knee Right 2004  . Appendectomy    . Abdominal hysterectomy  1975  . Instestinal surgery  1987    r/t blockage  . Nose surgery  1985    r/t deviated septum  . Transthoracic echocardiogram  09/19/2012    EF 55-60% with normal systolic function; mod AR; MVP with mild-mod regurg; LA  severely dilated; RV systolic pressure increased; mod dilated RA; RV systolic pressure increased - mild pulm HTN    Family History  Problem Relation Age of Onset  . Lymphoma Mother   . Aneurysm Other     grandmother, uncles, cousins  . Tuberculosis Father    Social History  Substance Use Topics  . Smoking status: Never Smoker   . Smokeless tobacco: Never Used  . Alcohol Use: No   OB History    No data available     Review of Systems  Cardiovascular: Positive for chest pain.  All other systems reviewed and are negative.     Allergies  Amitriptyline hcl; Atenolol; Azithromycin; Cefuroxime; Daypro; Doxycycline; Famvir; Hyzaar; Iodine; Metoprolol tartrate; Relafen; and Sterapred  Home Medications   Prior to Admission medications   Medication Sig Start Date End Date Taking? Authorizing Provider  acetaminophen (TYLENOL) 500 MG tablet Take 500 mg by mouth every 6 (six) hours as needed for mild pain.   Yes Historical Provider, MD  alendronate (FOSAMAX) 70 MG tablet Take 70 mg by mouth once a week. Take with a full glass of water on an empty stomach.   Yes Historical Provider, MD  amiodarone (PACERONE) 200 MG tablet TAKE 1 TABLET BY MOUTH EVERY DAY 02/05/15  Yes Pixie Casino, MD  amLODipine (NORVASC) 10 MG tablet Take 10 mg by mouth  at bedtime.    Yes Historical Provider, MD  aspirin EC 81 MG tablet Take 81 mg by mouth daily.   Yes Historical Provider, MD  B Complex-C (B-COMPLEX WITH VITAMIN C) tablet Take 1 tablet by mouth daily.   Yes Historical Provider, MD  Cholecalciferol 1000 units tablet Take 1,000 Units by mouth daily.   Yes Historical Provider, MD  levothyroxine (SYNTHROID, LEVOTHROID) 50 MCG tablet Take 50 mcg by mouth daily before breakfast.   Yes Historical Provider, MD  metoprolol succinate (TOPROL-XL) 50 MG 24 hr tablet TAKE 1 TABLET BY MOUTH EVERY DAY WITH OR IMMEDIATELY FOLLOWING A MEAL Patient taking differently: TAKE 1/2  TABLET BY MOUTH EVERY DAY WITH OR  IMMEDIATELY FOLLOWING A MEAL 07/31/15  Yes Pixie Casino, MD  Multiple Vitamins-Minerals (MULTIVITAMIN & MINERAL PO) Take 1 tablet by mouth daily.   Yes Historical Provider, MD  nitroGLYCERIN (NITROSTAT) 0.4 MG SL tablet Place 1 tablet (0.4 mg total) under the tongue every 5 (five) minutes as needed for chest pain. 07/02/15  Yes Pixie Casino, MD  Omega-3 Fatty Acids (FISH OIL PO) Take 1 tablet by mouth at bedtime.   Yes Historical Provider, MD  benzonatate (TESSALON) 100 MG capsule Take 1 capsule (100 mg total) by mouth 2 (two) times daily as needed for cough. 11/10/15   Quintella Reichert, MD   BP 146/56 mmHg  Pulse 51  Temp(Src) 97.6 F (36.4 C) (Oral)  Resp 18  Ht 5' (1.524 m)  Wt 113 lb (51.256 kg)  BMI 22.07 kg/m2  SpO2 98% Physical Exam  Constitutional: She appears well-developed and well-nourished.  HENT:  Head: Normocephalic and atraumatic.  Cardiovascular: Normal rate and regular rhythm.   No murmur heard. Pulmonary/Chest: Effort normal and breath sounds normal. No respiratory distress.  Abdominal: Soft. There is no tenderness. There is no rebound and no guarding.  Musculoskeletal: She exhibits no tenderness.  Trace edema bilateral lower extremities  Neurological: She is alert.  Mild expressive aphasia  Skin: Skin is warm and dry.  Psychiatric: She has a normal mood and affect. Her behavior is normal.  Nursing note and vitals reviewed.   ED Course  Procedures (including critical care time) Labs Review Labs Reviewed  BASIC METABOLIC PANEL - Abnormal; Notable for the following:    Glucose, Bld 112 (*)    BUN 29 (*)    Creatinine, Ser 1.55 (*)    GFR calc non Af Amer 29 (*)    GFR calc Af Amer 33 (*)    All other components within normal limits  CBC - Abnormal; Notable for the following:    RBC 3.75 (*)    All other components within normal limits  D-DIMER, QUANTITATIVE (NOT AT [facility]) - Abnormal; Notable for the following:    D-Dimer, Quant 0.77 (*)    All other  components within normal limits  I-STAT TROPOININ, ED  Randolm Idol, ED    Imaging Review Dg Chest 2 View  11/10/2015  CLINICAL DATA:  Chest pain. EXAM: CHEST  2 VIEW COMPARISON:  06/24/2015. FINDINGS: Enlarged cardiac silhouette with a mild increase in size. Tortuous aorta. Clear lungs. Diffusely prominent interstitial markings with diffuse peribronchial thickening. Diffuse osteopenia. Moderate scoliosis. IMPRESSION: No acute abnormality. Chronic bronchitic changes and mild chronic interstitial lung disease. Electronically Signed   By: Claudie Revering M.D.   On: 11/10/2015 12:28   I have personally reviewed and evaluated these images and lab results as part of my medical decision-making.   EKG Interpretation  Date/Time:  Monday November 10 2015 10:41:37 EDT Ventricular Rate:  63 PR Interval:  240 QRS Duration: 78 QT Interval:  452 QTC Calculation: 462 R Axis:   77 Text Interpretation:  Sinus rhythm with 1st degree A-V block Septal  infarct , age undetermined Abnormal ECG No significant change since last  tracing Confirmed by Hazle Coca [phone]) on 11/10/2015 11:03:41 AM      MDM   Final diagnoses:  Chest wall pain    Patient here for evaluation of chest pain that is worse with coughing, deep breaths. Pain is resolved after Tylenol in the emergency department. Presentation is not consistent with ACS, dissection, pneumonia. Patient is low risk for PE, doubt PE based on age adjusted d-dimer, no additional imaging pursued. Discussed with patient and family home care for chest pain. Providing Tessalon Perles to control her cough. Discussed Tylenol if she has recurrent pain with coughing. Discussed outpatient follow-up as well as close return precautions.    Quintella Reichert, MD 11/11/15 [phone]

## 2015-11-10 NOTE — ED Notes (Signed)
Pt verbalized understanding of d/c instructions, prescriptions, and follow-up care. No further questions/concerns, VSS, assisted to lobby in wheelchair.  

## 2016-01-11 ENCOUNTER — Emergency Department (HOSPITAL_COMMUNITY): Payer: Medicare Other

## 2016-01-11 ENCOUNTER — Encounter (HOSPITAL_COMMUNITY): Payer: Self-pay | Admitting: Emergency Medicine

## 2016-01-11 DIAGNOSIS — I129 Hypertensive chronic kidney disease with stage 1 through stage 4 chronic kidney disease, or unspecified chronic kidney disease: Secondary | ICD-10-CM | POA: Insufficient documentation

## 2016-01-11 DIAGNOSIS — Z7982 Long term (current) use of aspirin: Secondary | ICD-10-CM | POA: Diagnosis not present

## 2016-01-11 DIAGNOSIS — W0110XA Fall on same level from slipping, tripping and stumbling with subsequent striking against unspecified object, initial encounter: Secondary | ICD-10-CM | POA: Diagnosis not present

## 2016-01-11 DIAGNOSIS — Z79899 Other long term (current) drug therapy: Secondary | ICD-10-CM | POA: Insufficient documentation

## 2016-01-11 DIAGNOSIS — Z96651 Presence of right artificial knee joint: Secondary | ICD-10-CM | POA: Insufficient documentation

## 2016-01-11 DIAGNOSIS — S81811A Laceration without foreign body, right lower leg, initial encounter: Secondary | ICD-10-CM | POA: Diagnosis not present

## 2016-01-11 DIAGNOSIS — N189 Chronic kidney disease, unspecified: Secondary | ICD-10-CM | POA: Insufficient documentation

## 2016-01-11 DIAGNOSIS — S299XXA Unspecified injury of thorax, initial encounter: Secondary | ICD-10-CM | POA: Diagnosis present

## 2016-01-11 DIAGNOSIS — S2232XA Fracture of one rib, left side, initial encounter for closed fracture: Secondary | ICD-10-CM | POA: Diagnosis not present

## 2016-01-11 DIAGNOSIS — Y999 Unspecified external cause status: Secondary | ICD-10-CM | POA: Diagnosis not present

## 2016-01-11 DIAGNOSIS — S81812A Laceration without foreign body, left lower leg, initial encounter: Secondary | ICD-10-CM | POA: Diagnosis not present

## 2016-01-11 DIAGNOSIS — Y92009 Unspecified place in unspecified non-institutional (private) residence as the place of occurrence of the external cause: Secondary | ICD-10-CM | POA: Diagnosis not present

## 2016-01-11 DIAGNOSIS — Y939 Activity, unspecified: Secondary | ICD-10-CM | POA: Insufficient documentation

## 2016-01-11 DIAGNOSIS — Z8673 Personal history of transient ischemic attack (TIA), and cerebral infarction without residual deficits: Secondary | ICD-10-CM | POA: Insufficient documentation

## 2016-01-11 DIAGNOSIS — Z23 Encounter for immunization: Secondary | ICD-10-CM | POA: Insufficient documentation

## 2016-01-11 NOTE — ED Triage Notes (Signed)
Pt arrives via POV after a fall, family states she tripped over dishwasher door and hit bilateral legs then landed on her left side. Takes baby aspirin daily. Wounds to anterior bilateral calves noted.

## 2016-01-12 ENCOUNTER — Emergency Department (HOSPITAL_COMMUNITY)
Admission: EM | Admit: 2016-01-12 | Discharge: 2016-01-12 | Disposition: A | Discharge status: Home / Self Care | Payer: Medicare Other | Attending: Emergency Medicine | Admitting: Emergency Medicine

## 2016-01-12 ENCOUNTER — Emergency Department (HOSPITAL_COMMUNITY): Payer: Medicare Other

## 2016-01-12 DIAGNOSIS — Y92009 Unspecified place in unspecified non-institutional (private) residence as the place of occurrence of the external cause: Secondary | ICD-10-CM

## 2016-01-12 DIAGNOSIS — S2232XA Fracture of one rib, left side, initial encounter for closed fracture: Secondary | ICD-10-CM

## 2016-01-12 DIAGNOSIS — W19XXXA Unspecified fall, initial encounter: Secondary | ICD-10-CM

## 2016-01-12 DIAGNOSIS — S81811A Laceration without foreign body, right lower leg, initial encounter: Secondary | ICD-10-CM

## 2016-01-12 DIAGNOSIS — S81812A Laceration without foreign body, left lower leg, initial encounter: Secondary | ICD-10-CM

## 2016-01-12 MED ORDER — ACETAMINOPHEN 500 MG PO TABS
500.0000 mg | ORAL_TABLET | Freq: Once | ORAL | Status: AC
  Administered 2016-01-12: 500 mg via ORAL
  Filled 2016-01-12: qty 1

## 2016-01-12 MED ORDER — TETANUS-DIPHTH-ACELL PERTUSSIS 5-2.5-18.5 LF-MCG/0.5 IM SUSP
0.5000 mL | Freq: Once | INTRAMUSCULAR | Status: AC
  Administered 2016-01-12: 0.5 mL via INTRAMUSCULAR
  Filled 2016-01-12: qty 0.5

## 2016-01-12 MED ORDER — BUPIVACAINE HCL (PF) 0.5 % IJ SOLN
30.0000 mL | Freq: Once | INTRAMUSCULAR | Status: AC
  Administered 2016-01-12: 20 mL
  Filled 2016-01-12: qty 30

## 2016-01-12 MED ORDER — TRAMADOL HCL 50 MG PO TABS: 50.0000 mg | ORAL_TABLET | Freq: Four times a day (QID) | ORAL | 0 refills | Status: DC | PRN

## 2016-01-12 NOTE — ED Notes (Signed)
Family at bedside. 

## 2016-01-12 NOTE — ED Provider Notes (Signed)
LACERATION REPAIR Performed by: Linus Mako Authorized by: Linus Mako Consent: Verbal consent obtained. Risks and benefits: risks, benefits and alternatives were discussed Consent given by: patient Patient identity confirmed: provided demographic data Prepped and Draped in normal sterile fashion Wound explored  Laceration Location: left tib/fib  Laceration Length: 3.5 cm  No Foreign Bodies seen or palpated  Anesthesia: local infiltration  Local anesthetic: lidocaine 1% wo epinephrine  Anesthetic total: 3 ml  Irrigation method: syringe Amount of cleaning: standard  Skin closure: sutures  Number of sutures: 4'0 prolene  Technique: simple interrupted  Patient tolerance: Patient tolerated the procedure well with no immediate complications.   Patient seen by Dr. Rolland Porter, please refer to her note for HPI, ROS, PE, Plan and DISPO    Delos Haring, PA-C 01/12/16 [place], MD 01/12/16 [phone]

## 2016-01-12 NOTE — Discharge Instructions (Signed)
Keep the wounds clean and dry, the dressing should be changed daily. You can use antibiotic ointment on the wound. The sutures need to be removed in 10-12 days. Recheck sooner if the wound gets infected such as increased pain, swelling, drainage of pus, or increased redness. You do have at least one rib fracture. You can take the tramadol for pain if needed in addition to your acetaminophen. Recheck if you get short of breath or struggle to breathe, get a fever or cough. I did place an order for home health nurse to calm session wounds at home. They should call you some time later this morning.

## 2016-01-12 NOTE — ED Provider Notes (Signed)
[facility] DEPT Provider Note   CSN: [id number] Arrival date & time: 01/11/16  2327  By signing my name below, I, Dolores Hoose, attest that this documentation has been prepared under the direction and in the presence of Rolland Porter, MD . Electronically Signed: Dolores Hoose, Scribe. 01/12/2016. 12:49 AM.  Time seen 12:40 AM  History   Chief Complaint Chief Complaint  Patient presents with  . Fall  . Leg Injury   HPI   HPI Comments:  Madison Berg is a 82 y.o. female who presents to the Emergency Department complaining of sudden-onset constant unchanged left-sided chest pain s/p fall about 5 hours ago at 8 pm while she was unloading her dishwasher. She denies hitting her head or LOC. . She describes the pain as a sharp pain that is exacerbated by movement and deep breaths. Pt reports that she was putting away dishes when she fell on her left side. Pt also suffered bilateral lower extremity wounds from the fall. she thinks she slipped or tripped over the open door of her dishwasher. She endorses associated balance problems since a stoke 5 years ago, and unrelated chronic cough that has bothered her for a few years. She denies any dizziness, wrist pain, upper extremity pain, back pain, neck pain, light-headedness, headache or syncope. Pt takes baby aspirin daily. ? Tetanus  PCP Dr Shelia Media  Past Medical History:  Diagnosis Date  . Anxiety   . Aphasia as late effect of stroke 04/03/2014  . Cervical spondylosis   . Chronic kidney disease   . CVA (cerebral infarction) 04/2009   LMCA & posterior cerebral - excellent recovery, mild expressive aphasa  . Diverticulitis   . Gait disorder 04/03/2014  . History of nuclear stress test 06/2009   dipyridamole; negative, normal pattern of perfusion   . Hypertension   . Multiple lung nodules   . Nephrolithiasis   . OA (osteoarthritis)   . Osteopenia   . PAF (paroxysmal atrial fibrillation) ([place])   . Valvular heart disease   . Vitamin D  deficiency     Patient Active Problem List   Diagnosis Date Noted  . Chest pain, moderate coronary artery risk 06/24/2015  . Chest pain 12/14/2014  . Pain in joint, shoulder region 12/14/2014  . Acute-on-chronic kidney injury ([place]) 12/14/2014  . Head ache 04/03/2014  . Gait disorder 04/03/2014  . Aphasia as late effect of stroke 04/03/2014  . Acute on chronic intracranial subdural hematoma (H[facility]) 03/16/2014  . Subdural hematoma ([place]) 03/16/2014  . Low back pain 09/17/2013  . Aortic insufficiency 01/31/2013  . Stroke ([place]) 01/31/2013  . Leg length discrepancy 11/14/2012  . Acquired right flat foot 11/14/2012  . COUGH 08/13/2009  . Essential hypertension 03/28/2009  . Atrial fibrillation ([place]) 03/28/2009  . HEMORRHOIDS 03/28/2009  . DIVERTICULAR DISEASE 03/28/2009  . ARTHRITIS 03/28/2009  . MIGRAINES, HX OF 03/28/2009    Past Surgical History:  Procedure Laterality Date  . ABDOMINAL HYSTERECTOMY  1975  . APPENDECTOMY    . CATARACT EXTRACTION Bilateral 2013  . Instestinal Surgery  1987   r/t blockage  . NOSE SURGERY  1985   r/t deviated septum  . REPLACEMENT TOTAL KNEE Right 2004  . TRANSTHORACIC ECHOCARDIOGRAM  09/19/2012   EF 55-60% with normal systolic function; mod AR; MVP with mild-mod regurg; LA severely dilated; RV systolic pressure increased; mod dilated RA; RV systolic pressure increased - mild pulm HTN     OB History    No data available  Home Medications    Prior to Admission medications   Medication Sig Start Date End Date Taking? Authorizing Provider  acetaminophen (TYLENOL) 500 MG tablet Take 500 mg by mouth every 6 (six) hours as needed for mild pain.    Historical Provider, MD  alendronate (FOSAMAX) 70 MG tablet Take 70 mg by mouth once a week. Take with a full glass of water on an empty stomach.    Historical Provider, MD  amiodarone (PACERONE) 200 MG tablet TAKE 1 TABLET BY MOUTH EVERY DAY 02/05/15   Pixie Casino, MD  amLODipine (NORVASC) 10  MG tablet Take 10 mg by mouth at bedtime.     Historical Provider, MD  aspirin EC 81 MG tablet Take 81 mg by mouth daily.    Historical Provider, MD  B Complex-C (B-COMPLEX WITH VITAMIN C) tablet Take 1 tablet by mouth daily.    Historical Provider, MD  benzonatate (TESSALON) 100 MG capsule Take 1 capsule (100 mg total) by mouth 2 (two) times daily as needed for cough. 11/10/15   Quintella Reichert, MD  Cholecalciferol 1000 units tablet Take 1,000 Units by mouth daily.    Historical Provider, MD  levothyroxine (SYNTHROID, LEVOTHROID) 50 MCG tablet Take 50 mcg by mouth daily before breakfast.    Historical Provider, MD  metoprolol succinate (TOPROL-XL) 50 MG 24 hr tablet TAKE 1 TABLET BY MOUTH EVERY DAY WITH OR IMMEDIATELY FOLLOWING A MEAL Patient taking differently: TAKE 1/2  TABLET BY MOUTH EVERY DAY WITH OR IMMEDIATELY FOLLOWING A MEAL 07/31/15   Pixie Casino, MD  Multiple Vitamins-Minerals (MULTIVITAMIN & MINERAL PO) Take 1 tablet by mouth daily.    Historical Provider, MD  nitroGLYCERIN (NITROSTAT) 0.4 MG SL tablet Place 1 tablet (0.4 mg total) under the tongue every 5 (five) minutes as needed for chest pain. 07/02/15   Pixie Casino, MD  Omega-3 Fatty Acids (FISH OIL PO) Take 1 tablet by mouth at bedtime.    Historical Provider, MD  traMADol (ULTRAM) 50 MG tablet Take 1 tablet (50 mg total) by mouth every 6 (six) hours as needed. 01/12/16   Rolland Porter, MD    Family History Family History  Problem Relation Age of Onset  . Lymphoma Mother   . Tuberculosis Father   . Aneurysm Other     grandmother, uncles, cousins    Social History Social History  Substance Use Topics  . Smoking status: Never Smoker  . Smokeless tobacco: Never Used  . Alcohol use No  lives with sons Son states she is supposed to use a walker   Allergies   Amitriptyline hcl; Atenolol; Azithromycin; Cefuroxime; Daypro [oxaprozin]; Doxycycline; Famvir [famciclovir]; Hyzaar [losartan potassium-hctz]; Iodine; Metoprolol  tartrate; Relafen [nabumetone]; and Sterapred [prednisone]   Review of Systems Review of Systems  Respiratory: Positive for cough.   Cardiovascular: Positive for chest pain (Chest Wall).  Musculoskeletal: Positive for arthralgias (Legs) and myalgias. Negative for back pain and neck pain.  Skin: Positive for wound.  Neurological: Negative for dizziness, syncope, light-headedness and headaches.  All other systems reviewed and are negative.    Physical Exam Updated Vital Signs BP 157/66 (BP Location: Left Arm)   Pulse (!) 56   Temp 97.6 F (36.4 C) (Oral)   Resp 18   Ht 5' (1.524 m)   Wt 110 lb (49.9 kg)   SpO2 100%   BMI 21.48 kg/m   Vital signs normal    Physical Exam  Constitutional: She is oriented to person, place, and time.  Non-toxic appearance. She does not appear ill. No distress.  Frail elderly female  HENT:  Head: Normocephalic and atraumatic.  Right Ear: External ear normal.  Left Ear: External ear normal.  Nose: Nose normal. No mucosal edema or rhinorrhea.  Mouth/Throat: Oropharynx is clear and moist and mucous membranes are normal. No dental abscesses or uvula swelling.  Eyes: Conjunctivae and EOM are normal. Pupils are equal, round, and reactive to light.  Neck: Normal range of motion and full passive range of motion without pain. Neck supple.  Cardiovascular: Normal rate, regular rhythm and normal heart sounds.  Exam reveals no gallop and no friction rub.   No murmur heard. Tender to left ribcage about anterior axillary line around the area of the seventh rib . No bruising but tender to palpation.   Pulmonary/Chest: Effort normal and breath sounds normal. No respiratory distress. She has no wheezes. She has no rhonchi. She has no rales. She exhibits no tenderness and no crepitus.  Abdominal: Soft. Normal appearance and bowel sounds are normal. She exhibits no distension. There is no tenderness. There is no rebound and no guarding.  Musculoskeletal: Normal  range of motion. She exhibits no edema or tenderness.  Pelvis is non-tender to stressing she has good rom of hip and knees. RLE has a lot of swelling proximal to laceration.   Neurological: She is alert and oriented to person, place, and time. She has normal strength. No cranial nerve deficit.  Skin: Skin is warm, dry and intact. No rash noted. No erythema. No pallor.  RLE: 4cm laceration on proximal anterior shin.   LLE 3.5 cm laceration on the mid anterior shin.   Bleeding controlled at time of exam. Both lacerations are in the subcutaneous tissues.    Psychiatric: She has a normal mood and affect. Her speech is normal and behavior is normal. Her mood appears not anxious.  Nursing note and vitals reviewed.   Left lower leg    RLE    Both LE      ED Treatments / Results  Labs (all labs ordered are listed, but only abnormal results are displayed) Labs Reviewed - No data to display    Radiology Dg Chest 2 View  Result Date: 01/12/2016 CLINICAL DATA:  Fall to floor striking chest. EXAM: CHEST  2 VIEW COMPARISON:  11/10/2015 FINDINGS: Heart size and mediastinal contours are unchanged. There is stable tortuosity of the thoracic aorta. Stable chronic bronchitic change. No pneumothorax, pulmonary edema, pleural fluid or confluent airspace disease. No acute osseous abnormality is seen. Cervical rib on the left is incidentally noted. Scoliotic curvature of the thoracolumbar spine. IMPRESSION: Stable chronic change without acute abnormality. Electronically Signed   By: Jeb Levering M.D.   On: 01/12/2016 01:00   Dg Ribs Unilateral Left  Result Date: 01/12/2016 CLINICAL DATA:  Fall at home tonight with left rib pain. EXAM: LEFT RIBS - 2 VIEW COMPARISON:  Frontal and lateral views 2 hours prior. FINDINGS: Contour deformity of anterior left seventh rib may reflect a nondisplaced fracture. No displaced fracture is seen. Cervical rib is incidentally noted. Tortuous thoracic aorta, as  seen previously. IMPRESSION: Contour deformity anterior left seventh rib may reflect a nondisplaced fracture. Electronically Signed   By: Jeb Levering M.D.   On: 01/12/2016 03:02   Dg Tibia/fibula Right  Result Date: 01/12/2016 CLINICAL DATA:  Tripped and fall injury at home tonight. EXAM: RIGHT TIBIA AND FIBULA - 2 VIEW COMPARISON:  None. FINDINGS: Remote healed fracture deformity of the distal  fibula. No acute fracture is evident. No radiopaque foreign body. No soft tissue gas. Focal soft tissue swelling is present over the anterior -lateral proximal leg, likely corresponding to the laceration described clinically. Left total knee arthroplasty hardware is incompletely imaged but visible portions are grossly intact. IMPRESSION: Negative for acute fracture, dislocation or radiopaque foreign body. Remote healed fracture deformity of the distal fibula. Electronically Signed   By: Andreas Newport M.D.   On: 01/12/2016 02:59    Procedures Procedures (including critical care time)  Medications Ordered in ED Medications  bupivacaine (MARCAINE) 0.5 % injection 30 mL (20 mLs Infiltration Given by Other 01/12/16 0136)  Tdap (BOOSTRIX) injection 0.5 mL (0.5 mLs Intramuscular Given 01/12/16 0339)  acetaminophen (TYLENOL) tablet 500 mg (500 mg Oral Given 01/12/16 0339)     Initial Impression / Assessment and Plan / ED Course  I have reviewed the triage vital signs and the nursing notes.  Pertinent labs & imaging results that were available during my care of the patient were reviewed by me and considered in my medical decision making (see chart for details).  Clinical Course     Discussed treatment plan with pt at bedside and pt agreed to plan. X-rays were obtained of her right lower extremity because it had the most swelling evidence of trauma, rib films were done since regular chest x-ray can miss rib fractures.  Patient was sutured by PA Burr Medico  I informed patient of her x-ray results. She  requested home health nurse for wound care which she's had in the past. Her family is concerned about her taking strong pain medicine, they only wanted her to have Tylenol in ED. However they state if she can have some stronger to take at home if needed and her sons will help her more with ambulation if she takes the pain pills. They were given precautions to return to the ED such as signs of infection in the wound or signs of a punctured lung.  Final Clinical Impressions(s) / ED Diagnoses   Final diagnoses:  Fall at home, initial encounter  Laceration of lower extremity, left, initial encounter  Noninfected skin tear of lower extremity, right, initial encounter  Rib fracture, left, closed, initial encounter    New Prescriptions New Prescriptions   TRAMADOL (ULTRAM) 50 MG TABLET    Take 1 tablet (50 mg total) by mouth every 6 (six) hours as needed.   Plan discharge  Rolland Porter, MD, FACEP   I personally performed the services described in this documentation, which was scribed in my presence. The recorded information has been reviewed and considered.  Rolland Porter, MD, Barbette Or, MD 01/12/16 [phone]

## 2016-01-12 NOTE — ED Notes (Signed)
Dr.Knapp at bedside  

## 2016-01-15 ENCOUNTER — Telehealth: Payer: Self-pay | Admitting: *Deleted

## 2016-01-15 NOTE — Telephone Encounter (Signed)
[facility] Rep Santiago Glad) called to verify [facility] services and that pt was not set up with another agency.  Pt son reached out to company on [employer].  EDCM researched chart, could not find evidence of pt set up with another agency. EDCM advised Santiago Glad to begin services as request by the son.

## 2016-01-21 ENCOUNTER — Ambulatory Visit (INDEPENDENT_AMBULATORY_CARE_PROVIDER_SITE_OTHER): Payer: Medicare Other | Admitting: Pulmonary Disease

## 2016-01-21 ENCOUNTER — Encounter: Payer: Self-pay | Admitting: Pulmonary Disease

## 2016-01-21 ENCOUNTER — Other Ambulatory Visit (INDEPENDENT_AMBULATORY_CARE_PROVIDER_SITE_OTHER): Payer: Medicare Other

## 2016-01-21 VITALS — BP 116/62 | HR 66 | Wt 109.4 lb

## 2016-01-21 DIAGNOSIS — R05 Cough: Secondary | ICD-10-CM

## 2016-01-21 DIAGNOSIS — M1990 Unspecified osteoarthritis, unspecified site: Secondary | ICD-10-CM

## 2016-01-21 DIAGNOSIS — R059 Cough, unspecified: Secondary | ICD-10-CM

## 2016-01-21 DIAGNOSIS — R918 Other nonspecific abnormal finding of lung field: Secondary | ICD-10-CM | POA: Diagnosis not present

## 2016-01-21 DIAGNOSIS — J329 Chronic sinusitis, unspecified: Secondary | ICD-10-CM | POA: Diagnosis not present

## 2016-01-21 LAB — SEDIMENTATION RATE: Sed Rate: 45 mm/hr — ABNORMAL HIGH (ref 0–30)

## 2016-01-21 LAB — C-REACTIVE PROTEIN: CRP: 3 mg/dL (ref 0.5–20.0)

## 2016-01-21 MED ORDER — MONTELUKAST SODIUM 4 MG PO CHEW: 4.0000 mg | CHEWABLE_TABLET | Freq: Every day | ORAL | 3 refills | Status: DC

## 2016-01-21 MED ORDER — FLUTICASONE PROPIONATE 50 MCG/ACT NA SUSP: 1.0000 | Freq: Every day | NASAL | 2 refills | Status: DC

## 2016-01-21 NOTE — Progress Notes (Signed)
Subjective:    Patient ID: Madison Berg, female    DOB: 05/23/1923, 82 y.o.   MRN: [id number]  HPI She reports she has had a persistent cough for the past 4-6 years. Her cough produces a minimal amount of of clear mucus. She feels it has progressed over the last year. She feels her cough is primarily during the day and wake hours. She reports she occasionally has a tickle in the back of her throat but this is only intermittent as a trigger of her cough. She does cough more with inhaled irritants such as perfumes. She denies any nocturnal awakening with coughing but her son reports she does wake up with coughing spells. She feels she wakes up more to use the bathroom and coughs as a byproduct. She hasn't been noticed to cough in her sleep. She reports she does have chest discomfort with coughing spells. She denies any other chest pain or pressure. She reports the pain is pin-point pain in her left mid-clavicular line that is reproducible with pressing. She has had a recent fall and fractured a rib. She has noticed that she has a "runny nose". She reports her nose seems to run when she is eating, indoors, or even outdoors. She does have sinus congestion & pressure. She reports rare epistaxis. She denies any wheezing. She does feel dyspnea but reports this is primarily with her coughing. She reports very rare reflux or dyspepsia. No morning brash water taste. No fever, chills, or sweats. No history of treatment with inhalers.   Review of Systems No rashes but does bruise easily. She reports chronic joint pain and stiffness with her arthritis. A pertinent 14 point review of systems is negative except as per the history of presenting illness.  Allergies  Allergen Reactions  . Amitriptyline Hcl Itching  . Atenolol Other (See Comments)    Hair loss, thin nails, itching  . Azithromycin Other (See Comments)    Mouth sores; sore throat  . Cefuroxime   . Daypro [Oxaprozin] Other (See Comments)   dyspepsia  . Doxycycline Other (See Comments)    Mouth sores; sore throat  . Famvir [Famciclovir]   . Hyzaar [Losartan Potassium-Hctz] Other (See Comments)    Headache; tight face  . Iodine Other (See Comments)    Involuntary muscle contractions  . Metoprolol Tartrate Itching  . Relafen [Nabumetone] Other (See Comments)    Mouth sores  . Sterapred [Prednisone] Other (See Comments)    weakness    Current Outpatient Prescriptions on File Prior to Visit  Medication Sig Dispense Refill  . acetaminophen (TYLENOL) 500 MG tablet Take 500 mg by mouth every 6 (six) hours as needed for mild pain.    Marland Kitchen alendronate (FOSAMAX) 70 MG tablet Take 70 mg by mouth once a week. Take with a full glass of water on an empty stomach.    Marland Kitchen amiodarone (PACERONE) 200 MG tablet TAKE 1 TABLET BY MOUTH EVERY DAY 30 tablet 11  . amLODipine (NORVASC) 10 MG tablet Take 10 mg by mouth at bedtime.     Marland Kitchen aspirin EC 81 MG tablet Take 81 mg by mouth daily.    . B Complex-C (B-COMPLEX WITH VITAMIN C) tablet Take 1 tablet by mouth daily.    . benzonatate (TESSALON) 100 MG capsule Take 1 capsule (100 mg total) by mouth 2 (two) times daily as needed for cough. 10 capsule 0  . Cholecalciferol 1000 units tablet Take 1,000 Units by mouth daily.    Marland Kitchen levothyroxine (  SYNTHROID, LEVOTHROID) 50 MCG tablet Take 50 mcg by mouth daily before breakfast.    . metoprolol succinate (TOPROL-XL) 50 MG 24 hr tablet TAKE 1 TABLET BY MOUTH EVERY DAY WITH OR IMMEDIATELY FOLLOWING A MEAL (Patient taking differently: TAKE 1/2  TABLET BY MOUTH EVERY DAY WITH OR IMMEDIATELY FOLLOWING A MEAL) 30 tablet 8  . Multiple Vitamins-Minerals (MULTIVITAMIN & MINERAL PO) Take 1 tablet by mouth daily.    . nitroGLYCERIN (NITROSTAT) 0.4 MG SL tablet Place 1 tablet (0.4 mg total) under the tongue every 5 (five) minutes as needed for chest pain. 25 tablet 3  . Omega-3 Fatty Acids (FISH OIL PO) Take 1 tablet by mouth at bedtime.    . traMADol (ULTRAM) 50 MG tablet  Take 1 tablet (50 mg total) by mouth every 6 (six) hours as needed. (Patient not taking: Reported on 01/21/2016) 20 tablet 0   No current facility-administered medications on file prior to visit.     Past Medical History:  Diagnosis Date  . Anxiety   . Aphasia as late effect of stroke 04/03/2014  . Cervical spondylosis   . Chronic kidney disease   . CVA (cerebral infarction) 04/2009   LMCA & posterior cerebral - excellent recovery, mild expressive aphasa  . Diverticulitis   . Gait disorder 04/03/2014  . History of nuclear stress test 06/2009   dipyridamole; negative, normal pattern of perfusion   . Hypertension   . Multiple lung nodules   . Nephrolithiasis   . OA (osteoarthritis)   . Osteopenia   . PAF (paroxysmal atrial fibrillation) ([place])   . Valvular heart disease   . Vitamin D deficiency     Past Surgical History:  Procedure Laterality Date  . ABDOMINAL HYSTERECTOMY  1975  . APPENDECTOMY    . CATARACT EXTRACTION Bilateral 2013  . Instestinal Surgery  1987   r/t blockage  . NOSE SURGERY  1985   r/t deviated septum  . REPLACEMENT TOTAL KNEE Right 2004  . TRANSTHORACIC ECHOCARDIOGRAM  09/19/2012   EF 55-60% with normal systolic function; mod AR; MVP with mild-mod regurg; LA severely dilated; RV systolic pressure increased; mod dilated RA; RV systolic pressure increased - mild pulm HTN     Family History  Problem Relation Age of Onset  . Lymphoma Mother   . Tuberculosis Father   . Tuberculosis Brother   . Aneurysm Other     grandmother, uncles, cousins  . Lung disease Neg Hx     Social History   Social History  . Marital status: Widowed    Spouse name: N/A  . Number of children: 4  . Years of education: N/A   Occupational History  .      Retired   Social History Main Topics  . Smoking status: Passive Smoke Exposure - Never Smoker  . Smokeless tobacco: Never Used     Comment: Exposed at work, with her husband, & with her father.  . Alcohol use No  . Drug  use: No  . Sexual activity: Not Asked   Other Topics Concern  . None   Social History Narrative   Patient is right handed.   No caffeine   Live at home with two sons   Education one year of college.   Retired.      [facility]:   Originally from [facility]. She has always lived in [place]. No international travel. She does have prior travel to [facility]. 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 They have no  pets currently. No bird exposure. She does have one plant indoor currently. Does have carpet extensively throughout the home. No mold exposure.       Objective:   Physical Exam BP 116/62 (BP Location: Left Arm, Cuff Size: Normal)   Pulse 66   Wt 109 lb 6.4 oz (49.6 kg)   SpO2 98%   BMI 21.37 kg/m  General:  Awake. Alert. No distress. Accompanied by son today. Integument:  Warm & dry. No rash on exposed skin. Bruising of various ages on Her extremity exposed skin. Lymphatics:  No appreciated cervical or supraclavicular lymphadenoapthy. HEENT:  Moist mucus membranes. No oral ulcers. No scleral injection or icterus. Moderately to severely swollen bilateral nasal turbinates with him glucose oh. Cardiovascular:  Regular rate. 1+ pitting lower extremity edema. Pulmonary:  Good aeration & clear to auscultation bilaterally. Symmetric chest wall expansion. No accessory muscle use. Abdomen: Soft. Normal bowel sounds. Nondistended. Grossly nontender. Musculoskeletal:  Normal bulk and tone. No joint effusion appreciated. Patient does have deformity of bilateral PIP & DIP joints with some slight ulnar deviation. Neurological:  CN 2-12 grossly in tact. No meningismus. Moving all 4 extremities equally. Symmetric brachioradialis deep tendon reflexes. Psychiatric:  Mood and affect congruent. Speech normal rhythm, rate & tone.   IMAGING CXR PA/LAT 01/11/16 (personally reviewed by me): Scoliotic curvature of the thoracolumbar spine. Hyperinflation with flattening of the diaphragms and barreling of  the chest. Tenting of right hemidiaphragm. No focal opacity, nodule, or mass appreciated. No pleural effusion. Heart normal in size & mediastinum normal in contour.  CT CHEST W/O 01/14/10 (personally reviewed by me): No pleural effusion or thickening. No pathologic mediastinal adenopathy. No pericardial effusion. Scattered pulmonary nodules reportedly unchanged since September 2010 by radiologist. Noted 4 mm nodule within right lower lobe as well as partially groundglass 8 mm nodule within the lateral left lower lobe. No appreciable mass. No consolidation.  CARDIAC TTE (12/15/14): LV normal in size with EF 55-60%. No regional wall motion abnormalities. LA severely dilated & RA moderately to severely dilated. RV normal in size and function. Pulmonary artery systolic pressure 37 mmHg. Trivial aortic regurgitation without stenosis. Aortic root normal in size. Mild mitral regurgitation without stenosis. No pulmonic regurgitation. Mild to moderate tricuspid regurgitation. No pericardial effusion.  LABS 08/05/14 IgG:  762 IgA:  167 [place] IgG/IgM:  <0.91  08/14/09 IgE:  4.8    Assessment & Plan:  82 y.o. with long-standing history of cough, significant secondhand exposure to tobacco smoke, and chronic sinusitis. I do suspect there is an element of airway dysfunction given her reaction to inhaled chemical irritants. Reviewing her prior imaging does show previous nodules which could indicate an underlying atypical infectious process, but without symptoms I'm somewhat skeptical at this time. I suspect her postnasal drainage from her chronic sinusitis is a driving factor for cough; although, she has no nocturnal awakening with cough that would suggest this. I instructed the patient to contact my office if she had any new breathing problems or questions before her next appointment.  1. Cough: Checking spirometry with bronchodilator challenge with Xopenex prior to next appointment. Beginning treatment for chronic  sinusitis. 2. Multiple Lung Nodules: checking CT chest without contrast. 3. Chronic Sinusitis: Starting patient on Singulair 4 mg by mouth daily at bedtime & Flonase 1 spray each nostril once daily. Instructed patient on proper technique with intranasal medications. Also recommended nasal saline rinse twice daily prior to Flonase. Checking serum RAST panel. 4. Arthritis: Checking serum ANA, ESR, CRP, rheumatoid  factor, & anti-CCP. 5. Follow-up: Patient to return to clinic in 6 weeks or sooner if needed.   Sonia Baller Ashok Cordia, M.D. [facility]monary [facility] Critical [facility] Pager:  [phone] After 3pm or if no response, call [phone] 10:10 AM 01/21/16     Sonia Baller. Ashok Cordia, M.D. [facility]monary [facility] Critical [facility] Pager:  [phone] After 3pm or if no response, call [phone] 10:10 AM 01/21/16

## 2016-01-21 NOTE — Patient Instructions (Addendum)
   Call me if you have any problems with your medication.  Try using a nasal saline rinse such as NeilMed twice daily to rinse your sinuses before your Flonase nasal spray.  Remember to angle the Flonase nasal spray away from your nasal septum to prevent a nose bleed.  Call me if you have any questions or worsening in your cough before your next appointment.  I will see you back in 6 weeks to review your test results.  TESTS ORDERED: 1. Serum RAST Panel, ANA, ESR, CRP, Rheumatoid Factor, & Anti-CCP. 2. CT Chest w/o 3. Spirometry with bronchodilator challenge with Xopenex

## 2016-01-22 LAB — RHEUMATOID FACTOR: RHEUMATOID FACTOR: 22 [IU]/mL — AB (ref <14)

## 2016-01-22 LAB — RESPIRATORY ALLERGY PANEL REGION II W/ RFLX: ~~LOC~~
Allergen, A. alternata, m6: <0.1 kU/L
Allergen, C. Herbarum, M2: <0.1 kU/L
Allergen, Cedar tree, t12: <0.1 kU/L
Allergen, Comm Silver Birch, t9: <0.1 kU/L
Allergen, Cottonwood, t14: <0.1 kU/L
Allergen, D pternoyssinus,d7: 0.32 kU/L — ABNORMAL HIGH
Allergen, Mouse Urine Protein, e78: <0.1 kU/L
Allergen, Mulberry, t76: <0.1 kU/L
Allergen, Oak,t7: <0.1 kU/L
Allergen, P. notatum, m1: <0.1 kU/L
Aspergillus fumigatus, m3: <0.1 kU/L
Bermuda Grass: <0.1 kU/L
Box Elder IgE: <0.1 kU/L
Cat Dander: <0.1 kU/L
Cockroach: <0.1 kU/L
Common Ragweed: <0.1 kU/L
D. farinae: 0.29 kU/L — ABNORMAL HIGH
Dog Dander: <0.1 kU/L
Elm IgE: <0.1 kU/L
IgE (Immunoglobulin E), Serum: 9 kU/L
Johnson Grass: <0.1 kU/L
Pecan/Hickory Tree IgE: <0.1 kU/L
Rough Pigweed  IgE: <0.1 kU/L
Sheep Sorrel IgE: <0.1 kU/L
Timothy Grass: <0.1 kU/L

## 2016-01-22 LAB — CYCLIC CITRUL PEPTIDE ANTIBODY, IGG: CYCLIC CITRULLIN PEPTIDE AB: 28 U — AB

## 2016-01-22 LAB — ANTI-NUCLEAR AB-TITER (ANA TITER): ANA Titer 1: 1:160 {titer} — ABNORMAL HIGH

## 2016-01-22 LAB — ANA: Anti Nuclear Antibody(ANA): POSITIVE — AB

## 2016-01-27 ENCOUNTER — Other Ambulatory Visit: Payer: Self-pay | Admitting: Pulmonary Disease

## 2016-01-27 ENCOUNTER — Ambulatory Visit (INDEPENDENT_AMBULATORY_CARE_PROVIDER_SITE_OTHER)
Admission: RE | Admit: 2016-01-27 | Discharge: 2016-01-27 | Disposition: A | Discharge status: Home / Self Care | Payer: Medicare Other | Source: Ambulatory Visit | Attending: Pulmonary Disease | Admitting: Pulmonary Disease

## 2016-01-27 DIAGNOSIS — R918 Other nonspecific abnormal finding of lung field: Secondary | ICD-10-CM | POA: Diagnosis not present

## 2016-01-27 DIAGNOSIS — M0520 Rheumatoid vasculitis with rheumatoid arthritis of unspecified site: Secondary | ICD-10-CM

## 2016-02-05 ENCOUNTER — Encounter: Payer: Self-pay | Admitting: Internal Medicine

## 2016-02-05 ENCOUNTER — Ambulatory Visit (INDEPENDENT_AMBULATORY_CARE_PROVIDER_SITE_OTHER): Payer: Medicare Other | Admitting: Internal Medicine

## 2016-02-05 VITALS — BP 129/55 | HR 55 | Ht 60.0 in | Wt 109.0 lb

## 2016-02-05 DIAGNOSIS — I480 Paroxysmal atrial fibrillation: Secondary | ICD-10-CM | POA: Diagnosis not present

## 2016-02-05 DIAGNOSIS — I351 Nonrheumatic aortic (valve) insufficiency: Secondary | ICD-10-CM | POA: Diagnosis not present

## 2016-02-05 DIAGNOSIS — I10 Essential (primary) hypertension: Secondary | ICD-10-CM | POA: Diagnosis not present

## 2016-02-05 NOTE — Progress Notes (Signed)
OFFICE NOTE  Chief Complaint:  Follow-up  Primary Care Physician: Horatio Pel, MD  HPI:  Madison Berg is a pleasant 82 year old female followed for years by Dr. [name] with a history of paroxysmal atrial fibrillation. She had a stroke in 1478 with an embolic event to her left middle cerebral artery and posterior cerebral artery. She recovered fairly well except she has some mild expressive aphasia. Chest is a history of aortic insufficiency which has been moderate and was recently stable on an echocardiogram in May of 2014. Her ejection fraction is preserved and she said no ischemia. She had a Myoview stress test in February 2011 which was negative. She is on warfarin therapy and her INRs have been therapeutic and stable. She has no complaints of chest pain or shortness of breath. She maintains on amiodarone for rhythm control with her atrial fibrillation. She had recent thyroid function which was in normal limits, but I do not see recent pulmonary function testing. She will need repeat of this testing at her next office visit. Her other main complaint today is problems with bladder incontinence. She previously had seen Dr. Elder Negus and he had advised against medications for incontinence, but her problems have gotten worse and she may be a good candidate for this medication. I understand that she may have had a bladder surgery in the past.  [name] has no new complaints today. She denies any worsening shortness of breath or chest pain. She is again concerned about hair loss, however I do not appreciate any significant worsening of the thinning of her hair. She is asked whether it is okay for her to use biotin supplements. I do not have a problem with that. She has had a couple of more falls but is resistant to using a walker due to decreased mobility and independence. She is since stopped driving.  I saw [name] back in the office today. Unfortunately since I last  saw her she continued to have gait instability and had numerous falls. Ultimately she developed a subdural hematoma and was felt to be a very poor candidate at that point for anticoagulation with warfarin. This was discontinued. She is maintained on aspirin.  [name] is doing fairly well. She's had a couple of episodes of cerebral hemorrhage and was taken off of warfarin for this. She's had no falls recently and uses a 4 point walker. She fortunately has had very little A. fib and remains on low-dose amiodarone 200 mg daily.  I had the pleasure seeing [name] back in the office today for follow-up. Recently she was seen in the ER for an episode of chest pain. She apparently took 2 nitroglycerin for this and her symptoms eventually resolved. She denies any burning under the tongue and therefore her medicine, which is probably quite old me not been effective. Since then she's had no further symptoms. She says she feels great today.  02/05/2016  [name] returns today for follow-up. She's been very stable. She denies any recurrent chest pain. She is maintaining sinus rhythm on low-dose amiodarone and aspirin. Anticoagulation beyond this is contraindicated due to falls and chronic subdural hematomas. She has stable moderate aortic insufficiency with a wide pulse pressure and denies worsening shortness of breath with exertion. She's very concerned about chronic cough. She again asked today if her metoprolol was causing this. I'm unaware of this medicine causing this for her. Amiodarone could feasibly cause this if there were pulmonary fibrosis, however I do  not hear any clinical signs of that on exam. She is scheduled to have upcoming pulmonary function testing and a follow-up appointment with pulmonary.  PMHx:  Past Medical History:  Diagnosis Date  . Anxiety   . Aphasia as late effect of stroke 04/03/2014  . Cervical spondylosis   . Chronic kidney disease   . CVA (cerebral infarction)  04/2009   LMCA & posterior cerebral - excellent recovery, mild expressive aphasa  . Diverticulitis   . Gait disorder 04/03/2014  . History of nuclear stress test 06/2009   dipyridamole; negative, normal pattern of perfusion   . Hypertension   . Multiple lung nodules   . Nephrolithiasis   . OA (osteoarthritis)   . Osteopenia   . PAF (paroxysmal atrial fibrillation) ([place])   . Valvular heart disease   . Vitamin D deficiency     Past Surgical History:  Procedure Laterality Date  . ABDOMINAL HYSTERECTOMY  1975  . APPENDECTOMY    . CATARACT EXTRACTION Bilateral 2013  . Instestinal Surgery  1987   r/t blockage  . NOSE SURGERY  1985   r/t deviated septum  . REPLACEMENT TOTAL KNEE Right 2004  . TRANSTHORACIC ECHOCARDIOGRAM  09/19/2012   EF 55-60% with normal systolic function; mod AR; MVP with mild-mod regurg; LA severely dilated; RV systolic pressure increased; mod dilated RA; RV systolic pressure increased - mild pulm HTN     FAMHx:  Family History  Problem Relation Age of Onset  . Lymphoma Mother   . Tuberculosis Father   . Tuberculosis Brother   . Aneurysm Other     grandmother, uncles, cousins  . Lung disease Neg Hx     SOCHx:   reports that she is a non-smoker but has been exposed to tobacco smoke. She has never used smokeless tobacco. She reports that she does not drink alcohol or use drugs.  ALLERGIES:  Allergies  Allergen Reactions  . Amitriptyline Hcl Itching  . Atenolol Other (See Comments)    Hair loss, thin nails, itching  . Azithromycin Other (See Comments)    Mouth sores; sore throat  . Cefuroxime   . Daypro [Oxaprozin] Other (See Comments)    dyspepsia  . Doxycycline Other (See Comments)    Mouth sores; sore throat  . Famvir [Famciclovir]   . Hyzaar [Losartan Potassium-Hctz] Other (See Comments)    Headache; tight face  . Iodine Other (See Comments)    Involuntary muscle contractions  . Metoprolol Tartrate Itching  . Relafen [Nabumetone] Other (See  Comments)    Mouth sores  . Sterapred [Prednisone] Other (See Comments)    weakness    ROS: Pertinent items noted in HPI and remainder of comprehensive ROS otherwise negative.  HOME MEDS: Current Outpatient Prescriptions  Medication Sig Dispense Refill  . acetaminophen (TYLENOL) 500 MG tablet Take 500 mg by mouth every 6 (six) hours as needed for mild pain.    Marland Kitchen alendronate (FOSAMAX) 70 MG tablet Take 70 mg by mouth once a week. Take with a full glass of water on an empty stomach.    Marland Kitchen amiodarone (PACERONE) 200 MG tablet TAKE 1 TABLET BY MOUTH EVERY DAY 30 tablet 11  . amLODipine (NORVASC) 10 MG tablet Take 10 mg by mouth at bedtime.     Marland Kitchen aspirin EC 81 MG tablet Take 81 mg by mouth daily.    . B Complex-C (B-COMPLEX WITH VITAMIN C) tablet Take 1 tablet by mouth daily.    . benzonatate (TESSALON) 100 MG capsule Take  1 capsule (100 mg total) by mouth 2 (two) times daily as needed for cough. 10 capsule 0  . Cholecalciferol 1000 units tablet Take 1,000 Units by mouth daily.    . fluticasone (FLONASE) 50 MCG/ACT nasal spray Place 1 spray into both nostrils daily. 16 g 2  . levothyroxine (SYNTHROID, LEVOTHROID) 50 MCG tablet Take 50 mcg by mouth daily before breakfast.    . metoprolol succinate (TOPROL-XL) 50 MG 24 hr tablet TAKE 1 TABLET BY MOUTH EVERY DAY WITH OR IMMEDIATELY FOLLOWING A MEAL (Patient taking differently: TAKE 1/2  TABLET BY MOUTH EVERY DAY WITH OR IMMEDIATELY FOLLOWING A MEAL) 30 tablet 8  . montelukast (SINGULAIR) 4 MG chewable tablet Chew 1 tablet (4 mg total) by mouth at bedtime. 30 tablet 3  . Multiple Vitamins-Minerals (MULTIVITAMIN & MINERAL PO) Take 1 tablet by mouth daily.    . nitroGLYCERIN (NITROSTAT) 0.4 MG SL tablet Place 1 tablet (0.4 mg total) under the tongue every 5 (five) minutes as needed for chest pain. 25 tablet 3  . Omega-3 Fatty Acids (FISH OIL PO) Take 1 tablet by mouth at bedtime.    . traMADol (ULTRAM) 50 MG tablet Take 1 tablet (50 mg total) by mouth  every 6 (six) hours as needed. 20 tablet 0   No current facility-administered medications for this visit.     LABS/IMAGING: No results found for this or any previous visit (from the past 48 hour(s)). No results found.  VITALS: BP (!) 129/55   Pulse (!) 55   Ht 5' (1.524 m)   Wt 109 lb (49.4 kg)   BMI 21.29 kg/m   EXAM: General appearance: alert and no distress Neck: no adenopathy, no carotid bruit, no JVD, supple, symmetrical, trachea midline and thyroid not enlarged, symmetric, no tenderness/mass/nodules Lungs: clear to auscultation bilaterally Heart: regular rate and rhythm, S1, S2 normal and diastolic murmur: holodiastolic 2/6, blowing at 2nd right intercostal space Abdomen: soft, non-tender; bowel sounds normal; no masses,  no organomegaly Extremities: extremities normal, atraumatic, no cyanosis or edema Pulses: 2+ and symmetric Skin: Skin color, texture, turgor normal. No rashes or lesions Neurologic: Grossly normal, mild expressive aphasia Psych: Mood, affect normal  EKG: Sinus bradycardia with first-degree AV block at 55, nonspecific ST changes  ASSESSMENT: 1. Stable moderate aortic insufficiency 2. Paroxysmal atrial fibrillation - CHADSVASC 4- on aspirin and amiodarone 3. Hypertension-controlled 4. Dizziness with falls 5. Chronic subdural hematomas - warfarin is contraindicated 6. Chest pain 7. Chronic cough  PLAN: 1.   Mrs. Prout Continues to have complaints regarding chronic cough. It's not clear that her medications are the cause of this. She is followed by pulmonary for this. Her A. Fib is well controlled and she remains on amiodarone and low-dose aspirin as warfarin is contraindicated due to chronic subdural hematomas. Blood pressure is well-controlled. Her AI is stable.  Follow-up in 6 months.   Pixie Casino, MD, [facility] Attending Cardiologist [place] 02/05/2016, 3:11 PM

## 2016-02-05 NOTE — Patient Instructions (Signed)
Medication Instructions:  Your physician recommends that you continue on your current medications as directed. Please refer to the Current Medication list given to you today.  Labwork: NONE   Testing/Procedures: NONE  Follow-Up: Your physician wants you to follow-up in: 6 MONTHS WITH DR HILTY. You will receive a reminder letter in the mail two months in advance. If you don't receive a letter, please call our office to schedule the follow-up appointment.  Any Other Special Instructions Will Be Listed Below (If Applicable).     If you need a refill on your cardiac medications before your next appointment, please call your pharmacy.

## 2016-02-14 ENCOUNTER — Other Ambulatory Visit: Payer: Self-pay | Admitting: Internal Medicine

## 2016-02-16 NOTE — Telephone Encounter (Signed)
Rx has been sent to the pharmacy electronically. ° °

## 2016-03-17 ENCOUNTER — Telehealth: Payer: Self-pay | Admitting: Pulmonary Disease

## 2016-03-17 ENCOUNTER — Ambulatory Visit (INDEPENDENT_AMBULATORY_CARE_PROVIDER_SITE_OTHER): Payer: Medicare Other | Admitting: Pulmonary Disease

## 2016-03-17 ENCOUNTER — Encounter: Payer: Self-pay | Admitting: Pulmonary Disease

## 2016-03-17 VITALS — BP 130/62 | HR 64 | Ht 60.5 in | Wt 104.0 lb

## 2016-03-17 DIAGNOSIS — M1990 Unspecified osteoarthritis, unspecified site: Secondary | ICD-10-CM

## 2016-03-17 DIAGNOSIS — R059 Cough, unspecified: Secondary | ICD-10-CM

## 2016-03-17 DIAGNOSIS — J329 Chronic sinusitis, unspecified: Secondary | ICD-10-CM

## 2016-03-17 DIAGNOSIS — R05 Cough: Secondary | ICD-10-CM

## 2016-03-17 DIAGNOSIS — R918 Other nonspecific abnormal finding of lung field: Secondary | ICD-10-CM

## 2016-03-17 LAB — PULMONARY FUNCTION TEST
FEF 25-75 PRE: 1.12 L/s
FEF 25-75 Post: 1.37 L/sec
FEF2575-%Change-Post: 22 %
FEF2575-%PRED-POST: 224 %
FEF2575-%Pred-Pre: 183 %
FEV1-%CHANGE-POST: 3 %
FEV1-%Pred-Post: 140 %
FEV1-%Pred-Pre: 134 %
FEV1-POST: 1.61 L
FEV1-PRE: 1.55 L
FEV1FVC-%Change-Post: 0 %
FEV1FVC-%Pred-Pre: 104 %
FEV6-%CHANGE-POST: 3 %
FEV6-%PRED-POST: 145 %
FEV6-%PRED-PRE: 140 %
FEV6-POST: 2.14 L
FEV6-PRE: 2.06 L
FEV6FVC-%CHANGE-POST: 0 %
FEV6FVC-%PRED-POST: 108 %
FEV6FVC-%PRED-PRE: 108 %
FVC-%CHANGE-POST: 3 %
FVC-%PRED-POST: 134 %
FVC-%Pred-Pre: 129 %
FVC-Post: 2.15 L
FVC-Pre: 2.06 L
POST FEV1/FVC RATIO: 75 %
POST FEV6/FVC RATIO: 100 %
PRE FEV6/FVC RATIO: 100 %
Pre FEV1/FVC ratio: 75 %

## 2016-03-17 NOTE — Patient Instructions (Signed)
   Try stopping your Flonase nasal spray temporarily with your nose bleeding.  Keep taking your Singulair. If your sinus congestion & drainage gets worse then try restarting your Flonase but make sure you are angling it outward from the bridge of your nose.  Call me if you have any new breathing problems or questions before your next appointment.

## 2016-03-17 NOTE — Telephone Encounter (Signed)
IMAGING CT CHEST W/O 01/27/16 (personally reviewed by me):  Tree-in-bud opacity right middle lobe. Multiple bilateral pulmonary nodules largest of which measures 5 mm in right lower lobe. No evidence of consolidation or opacification. Nodules largely appear stable in size. No pleural effusion or thickening. No pathologic mediastinal adenopathy. No pericardial effusion. Only new nodule appears to be a 3 mm left lower lobe nodule at site of prior consolidation. Remainder of nodules seem to be stable going back to 2011 per radiologist. Somewhat dilated patulous esophagus.  LABS 01/21/16 IgE: 9 RAST Panel:  D farinae 0.29 / D pternoyssinus 0.32 ESR:  45 CRP:  3.0 ANA:  1:160 (homgeneous) Anti-CCP: 28 RF:  22

## 2016-03-17 NOTE — Progress Notes (Signed)
Subjective:    Patient ID: Madison Berg, female    DOB: 09/09/1923, 82 y.o.   MRN: [id number]  C.C.:  Follow-up for   HPI Cough: She reports her cough has continued. She reports her cough produces a clear mucus. She reports her cough is relatively unchanged.   Multiple Lung Nodules: Majority of the nodules have been stable since 2011. Patient does have a 3 mm nodule and left lower lobe and the site of previous consolidation.  Chronic Sinusitis: Started on Singulair daily at bedtime & Flonase at last appointment. Patient also recommended to try twice daily nasal saline rinse. She reports her sinus congestion seems to have improved with her treatment. She has had mild epistaxis unilaterally. She did have to undergo electrocautery for the bleeding.   Arthritis: Serum workup highly suspicious for rheumatoid arthritis. She has been referred to [facility].   Review of Systems She reports she previously had chest pain but hasn't had any new chest pain or pressure recently. No fever or chills. No nausea or abdominal pain.   Allergies  Allergen Reactions  . Amitriptyline Hcl Itching  . Atenolol Other (See Comments)    Hair loss, thin nails, itching  . Azithromycin Other (See Comments)    Mouth sores; sore throat  . Cefuroxime   . Daypro [Oxaprozin] Other (See Comments)    dyspepsia  . Doxycycline Other (See Comments)    Mouth sores; sore throat  . Famvir [Famciclovir]   . Hyzaar [Losartan Potassium-Hctz] Other (See Comments)    Headache; tight face  . Iodine Other (See Comments)    Involuntary muscle contractions  . Metoprolol Tartrate Itching  . Relafen [Nabumetone] Other (See Comments)    Mouth sores  . Sterapred [Prednisone] Other (See Comments)    weakness    Current Outpatient Prescriptions on File Prior to Visit  Medication Sig Dispense Refill  . acetaminophen (TYLENOL) 500 MG tablet Take 500 mg by mouth every 6 (six) hours as needed for mild pain.    Marland Kitchen  alendronate (FOSAMAX) 70 MG tablet Take 70 mg by mouth once a week. Take with a full glass of water on an empty stomach.    Marland Kitchen amiodarone (PACERONE) 200 MG tablet TAKE 1 TABLET BY MOUTH EVERY DAY 30 tablet 11  . amLODipine (NORVASC) 10 MG tablet Take 10 mg by mouth at bedtime.     Marland Kitchen aspirin EC 81 MG tablet Take 81 mg by mouth daily.    . B Complex-C (B-COMPLEX WITH VITAMIN C) tablet Take 1 tablet by mouth daily.    . benzonatate (TESSALON) 100 MG capsule Take 1 capsule (100 mg total) by mouth 2 (two) times daily as needed for cough. 10 capsule 0  . Cholecalciferol 1000 units tablet Take 1,000 Units by mouth daily.    . fluticasone (FLONASE) 50 MCG/ACT nasal spray Place 1 spray into both nostrils daily. 16 g 2  . levothyroxine (SYNTHROID, LEVOTHROID) 50 MCG tablet Take 50 mcg by mouth daily before breakfast.    . metoprolol succinate (TOPROL-XL) 50 MG 24 hr tablet TAKE 1 TABLET BY MOUTH EVERY DAY WITH OR IMMEDIATELY FOLLOWING A MEAL (Patient taking differently: TAKE 1/2  TABLET BY MOUTH EVERY DAY WITH OR IMMEDIATELY FOLLOWING A MEAL) 30 tablet 8  . montelukast (SINGULAIR) 4 MG chewable tablet Chew 1 tablet (4 mg total) by mouth at bedtime. 30 tablet 3  . Multiple Vitamins-Minerals (MULTIVITAMIN & MINERAL PO) Take 1 tablet by mouth daily.    . nitroGLYCERIN (  NITROSTAT) 0.4 MG SL tablet Place 1 tablet (0.4 mg total) under the tongue every 5 (five) minutes as needed for chest pain. 25 tablet 3  . Omega-3 Fatty Acids (FISH OIL PO) Take 1 tablet by mouth at bedtime.    . traMADol (ULTRAM) 50 MG tablet Take 1 tablet (50 mg total) by mouth every 6 (six) hours as needed. 20 tablet 0   No current facility-administered medications on file prior to visit.     Past Medical History:  Diagnosis Date  . Anxiety   . Aphasia as late effect of stroke 04/03/2014  . Cervical spondylosis   . Chronic kidney disease   . CVA (cerebral infarction) 04/2009   LMCA & posterior cerebral - excellent recovery, mild  expressive aphasa  . Diverticulitis   . Gait disorder 04/03/2014  . History of nuclear stress test 06/2009   dipyridamole; negative, normal pattern of perfusion   . Hypertension   . Multiple lung nodules   . Nephrolithiasis   . OA (osteoarthritis)   . Osteopenia   . PAF (paroxysmal atrial fibrillation) ([place])   . Valvular heart disease   . Vitamin D deficiency     Past Surgical History:  Procedure Laterality Date  . ABDOMINAL HYSTERECTOMY  1975  . APPENDECTOMY    . CATARACT EXTRACTION Bilateral 2013  . Instestinal Surgery  1987   r/t blockage  . NOSE SURGERY  1985   r/t deviated septum  . REPLACEMENT TOTAL KNEE Right 2004  . TRANSTHORACIC ECHOCARDIOGRAM  09/19/2012   EF 55-60% with normal systolic function; mod AR; MVP with mild-mod regurg; LA severely dilated; RV systolic pressure increased; mod dilated RA; RV systolic pressure increased - mild pulm HTN     Family History  Problem Relation Age of Onset  . Lymphoma Mother   . Tuberculosis Father   . Tuberculosis Brother   . Aneurysm Other     grandmother, uncles, cousins  . Lung disease Neg Hx     Social History   Social History  . Marital status: Widowed    Spouse name: N/A  . Number of children: 4  . Years of education: N/A   Occupational History  .      Retired   Social History Main Topics  . Smoking status: Passive Smoke Exposure - Never Smoker  . Smokeless tobacco: Never Used     Comment: Exposed at work, with her husband, & with her father.  . Alcohol use No  . Drug use: No  . Sexual activity: Not Asked   Other Topics Concern  . None   Social History Narrative   Patient is right handed.   No caffeine   Live at home with two sons   Education one year of college.   Retired.      [facility] [facility]:   Originally from [facility] [facility]. She has always lived in [place]. No international travel. She does have prior travel to [facility] 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 They have no pets currently. No bird  exposure. She does have one plant indoor currently. Does have carpet extensively throughout the home. No mold exposure.       Objective:   Physical Exam BP 130/62 (BP Location: Left Arm, Cuff Size: Normal)   Pulse 64   Ht 5' 0.5" (1.537 m)   Wt 104 lb (47.2 kg)   SpO2 96%   BMI 19.98 kg/m  General:  Awake. No distress. Comfortable. Integument:  Warm & dry. No rash  on exposed skin. Lymphatics:  No appreciated cervical or supraclavicular lymphadenoapthy. HEENT:  Moist mucus membranes. Mild bilateral nasal turbinate swelling.  Cardiovascular:  Regular rate. Trace edema in lower extremities. Unable to appreciate JVD given body positioning. Pulmonary:  Normal work of breathing on room air and clear to auscultation. No accessory muscle use. Musculoskeletal:  Normal bulk and tone. Joint deformity of bilateral PIP & DIP joints with some slight ulnar deviation.  PFT 03/17/16:  FVC 2.06 L (129%) FEV1 1.55 L (134%) FEV1/FVC 0.75 FEF 25-75 1.12 L (183%) negative bronchodilator response  IMAGING CT CHEST W/O 01/27/16 (personally reviewed by me):  Tree-in-bud opacity right middle lobe. Multiple bilateral pulmonary nodules largest of which measures 5 mm in right lower lobe. No evidence of consolidation or opacification. Nodules largely appear stable in size. No pleural effusion or thickening. No pathologic mediastinal adenopathy. No pericardial effusion. Only new nodule appears to be a 3 mm left lower lobe nodule at site of prior consolidation. Remainder of nodules seem to be stable going back to 2011 per radiologist. Somewhat dilated patulous esophagus.  CXR PA/LAT 01/11/16 (previously reviewed by me): Scoliotic curvature of the thoracolumbar spine. Hyperinflation with flattening of the diaphragms and barreling of the chest. Tenting of right hemidiaphragm. No focal opacity, nodule, or mass appreciated. No pleural effusion. Heart normal in size & mediastinum normal in contour.  CT CHEST W/O 01/14/10  (previously reviewed by me): No pleural effusion or thickening. No pathologic mediastinal adenopathy. No pericardial effusion. Scattered pulmonary nodules reportedly unchanged since September 2010 by radiologist. Noted 4 mm nodule within right lower lobe as well as partially groundglass 8 mm nodule within the lateral left lower lobe. No appreciable mass. No consolidation.  CARDIAC TTE (12/15/14): LV normal in size with EF 55-60%. No regional wall motion abnormalities. LA severely dilated & RA moderately to severely dilated. RV normal in size and function. Pulmonary artery systolic pressure 37 mmHg. Trivial aortic regurgitation without stenosis. Aortic root normal in size. Mild mitral regurgitation without stenosis. No pulmonic regurgitation. Mild to moderate tricuspid regurgitation. No pericardial effusion.  LABS 01/21/16 IgE: 9 RAST Panel:  D farinae 0.29 / D pternoyssinus 0.32 ESR:  45 CRP:  3.0 ANA:  1:160 (homgeneous) Anti-CCP: 28 RF:  22  08/05/14 IgG:  762 IgA:  167 Lyme IgG/IgM:  <0.91  08/14/09 IgE:  4.8    Assessment & Plan:  82 y.o. with long-standing history of cough, significant secondhand exposure to tobacco smoke, and chronic sinusitis. Serum workup highly suspicious for rheumatoid arthritis. Pulmonary function testing/spirometry today is normal without a significant bronchodilator response. Trying the patient on Singulair alone and instructed her on proper technique with her Flonase. I instructed the patient contact my office if she had any new breathing problems or questions before her next appointment.  1. Cough:  Likely due to post-nasal drainage.  2. Multiple Lung Nodules:  No sign of developing mass when compared with CT from 2011. 3. Chronic Sinusitis:  Improved symptoms with Singulair & Flonase. Patient going to try holiday from [facility] given epistaxis. 4. Arthritis:  Possibly RA. Has been referred to Rheumatology. 5. Health Maintenance:  S/P Influenza Vaccine August  2017, Tdap September 2017, Prenvar Vaccine July 2015, & Pneumovax 24 April 1993. 6. Follow-up: Patient to return to clinic in 3 months or sooner if needed.   Sonia Baller Ashok Cordia, M.D. [facility] Pager:  [phone] After 3pm or if no response, call [phone] 2:52 PM 03/17/16

## 2016-04-30 ENCOUNTER — Other Ambulatory Visit: Payer: Self-pay | Admitting: Internal Medicine

## 2016-05-05 ENCOUNTER — Other Ambulatory Visit: Payer: Self-pay | Admitting: Internal Medicine

## 2016-05-10 ENCOUNTER — Telehealth: Payer: Self-pay | Admitting: Neurology

## 2016-05-10 NOTE — Telephone Encounter (Signed)
Patient reports new left hand numbness, dropping of things and can't pick them up that started about 3 mths ago. The right hand has started with symptoms to a much lesser degree.  An appointment was made for 05/12/16. Advised that the nurse would call if there was any other questions.

## 2016-05-10 NOTE — Telephone Encounter (Signed)
Events noted, I will reevaluate the patient when she is seen on January 10.

## 2016-05-12 ENCOUNTER — Ambulatory Visit (INDEPENDENT_AMBULATORY_CARE_PROVIDER_SITE_OTHER): Payer: Medicare Other | Admitting: Neurology

## 2016-05-12 ENCOUNTER — Encounter: Payer: Self-pay | Admitting: Neurology

## 2016-05-12 VITALS — BP 159/63 | HR 63 | Ht 60.5 in | Wt 113.5 lb

## 2016-05-12 DIAGNOSIS — R202 Paresthesia of skin: Secondary | ICD-10-CM | POA: Diagnosis not present

## 2016-05-12 DIAGNOSIS — R269 Unspecified abnormalities of gait and mobility: Secondary | ICD-10-CM

## 2016-05-12 DIAGNOSIS — G5603 Carpal tunnel syndrome, bilateral upper limbs: Secondary | ICD-10-CM

## 2016-05-12 DIAGNOSIS — I69320 Aphasia following cerebral infarction: Secondary | ICD-10-CM | POA: Diagnosis not present

## 2016-05-12 NOTE — Progress Notes (Signed)
Reason for visit: Gait disorder  Madison Berg is an 83 y.o. female  History of present illness:  Madison Berg is a 83 year old right-handed white female with a history of cerebrovascular disease with a residual aphasia. The patient has a gait disturbance, she has fallen on 01/12/2016, she fortunately did not sustain significant injury. She was seen in emergency room on 10/09/2015 for vertigo, this is not a persistent problem. The patient claims over the last several weeks she has developed bilateral hand numbness that is worse in the evenings, it may wake her up at night, she may drop things from her hands. The numbness started on the left hand and eventually went to the right hand. She has symptoms consistent with a peripheral neuropathy as well with numbness and dysesthesias in the feet. She uses a walker for ambulation. She generally can walk fairly well with the walker. She returns for an evaluation.  Past Medical History:  Diagnosis Date  . Anxiety   . Aphasia as late effect of stroke 04/03/2014  . Cervical spondylosis   . Chronic kidney disease   . CVA (cerebral infarction) 04/2009   LMCA & posterior cerebral - excellent recovery, mild expressive aphasa  . Diverticulitis   . Gait disorder 04/03/2014  . History of nuclear stress test 06/2009   dipyridamole; negative, normal pattern of perfusion   . Hypertension   . Multiple lung nodules   . Nephrolithiasis   . OA (osteoarthritis)   . Osteopenia   . PAF (paroxysmal atrial fibrillation) ([place])   . Valvular heart disease   . Vitamin D deficiency     Past Surgical History:  Procedure Laterality Date  . ABDOMINAL HYSTERECTOMY  1975  . APPENDECTOMY    . CATARACT EXTRACTION Bilateral 2013  . Instestinal Surgery  1987   r/t blockage  . NOSE SURGERY  1985   r/t deviated septum  . REPLACEMENT TOTAL KNEE Right 2004  . TRANSTHORACIC ECHOCARDIOGRAM  09/19/2012   EF 55-60% with normal systolic function; mod AR; MVP with  mild-mod regurg; LA severely dilated; RV systolic pressure increased; mod dilated RA; RV systolic pressure increased - mild pulm HTN     Family History  Problem Relation Age of Onset  . Lymphoma Mother   . Tuberculosis Father   . Tuberculosis Brother   . Aneurysm Other     grandmother, uncles, cousins  . Lung disease Neg Hx     Social history:  reports that she is a non-smoker but has been exposed to tobacco smoke. She has never used smokeless tobacco. She reports that she does not drink alcohol or use drugs.    Allergies  Allergen Reactions  . Amitriptyline Hcl Itching  . Atenolol Other (See Comments)    Hair loss, thin nails, itching  . Azithromycin Other (See Comments)    Mouth sores; sore throat  . Cefuroxime   . Daypro [Oxaprozin] Other (See Comments)    dyspepsia  . Doxycycline Other (See Comments)    Mouth sores; sore throat  . Famvir [Famciclovir]   . Hyzaar [Losartan Potassium-Hctz] Other (See Comments)    Headache; tight face  . Iodine Other (See Comments)    Involuntary muscle contractions  . Metoprolol Tartrate Itching  . Relafen [Nabumetone] Other (See Comments)    Mouth sores  . Sterapred [Prednisone] Other (See Comments)    weakness    Medications:  Prior to Admission medications   Medication Sig Start Date End Date Taking? Authorizing Provider  acetaminophen (TYLENOL) 500 MG tablet Take 500 mg by mouth every 6 (six) hours as needed for mild pain.   Yes Historical Provider, MD  alendronate (FOSAMAX) 70 MG tablet Take 70 mg by mouth once a week. Take with a full glass of water on an empty stomach.   Yes Historical Provider, MD  amiodarone (PACERONE) 200 MG tablet TAKE 1 TABLET BY MOUTH EVERY DAY 02/16/16  Yes Pixie Casino, MD  amLODipine (NORVASC) 10 MG tablet Take 10 mg by mouth at bedtime.    Yes Historical Provider, MD  aspirin EC 81 MG tablet Take 81 mg by mouth daily.   Yes Historical Provider, MD  B Complex-C (B-COMPLEX WITH VITAMIN C) tablet  Take 1 tablet by mouth daily.   Yes Historical Provider, MD  Cholecalciferol 1000 units tablet Take 1,000 Units by mouth daily.   Yes Historical Provider, MD  fluticasone (FLONASE) 50 MCG/ACT nasal spray Place 1 spray into both nostrils daily. 01/21/16  Yes Javier Glazier, MD  levothyroxine (SYNTHROID, LEVOTHROID) 50 MCG tablet Take 50 mcg by mouth daily before breakfast.   Yes Historical Provider, MD  metoprolol succinate (TOPROL-XL) 50 MG 24 hr tablet Take 1 tablet (50 mg total) by mouth daily. 04/30/16  Yes Pixie Casino, MD  montelukast (SINGULAIR) 4 MG chewable tablet Chew 1 tablet (4 mg total) by mouth at bedtime. 01/21/16  Yes Javier Glazier, MD  Multiple Vitamins-Minerals (MULTIVITAMIN & MINERAL PO) Take 1 tablet by mouth daily.   Yes Historical Provider, MD  nitroGLYCERIN (NITROSTAT) 0.4 MG SL tablet Place 1 tablet (0.4 mg total) under the tongue every 5 (five) minutes as needed for chest pain. 07/02/15  Yes Pixie Casino, MD  Omega-3 Fatty Acids (FISH OIL PO) Take 1 tablet by mouth at bedtime.   Yes Historical Provider, MD  traMADol (ULTRAM) 50 MG tablet Take 1 tablet (50 mg total) by mouth every 6 (six) hours as needed. 01/12/16  Yes Rolland Porter, MD    ROS:  Out of a complete 14 system review of symptoms, the patient complains only of the following symptoms, and all other reviewed systems are negative.  Hearing loss, ringing in the ears, runny nose Cough Restless legs Food allergies Incontinence of the bladder Walking difficulty Swollen lymph nodes, bruising easily Numbness, speech difficulty Anxiety  Blood pressure (!) 159/63, pulse 63, height 5' 0.5" (1.537 m), weight 113 lb 8 oz (51.5 kg).  Physical Exam  General: The patient is alert and cooperative at the time of the examination.  Skin: 2+ edema is noted with the right lower extremity below the knee, 1+ on the left.   Neurologic Exam  Mental status: The patient is alert and oriented x 3 at the time of the  examination. The patient has apparent normal recent and remote memory, with an apparently normal attention span and concentration ability.   Cranial nerves: Facial symmetry is present. Speech is aphasic, the patient has word finding issues. Extraocular movements are full. Visual fields are full.  Motor: The patient has good strength in all 4 extremities.  Sensory examination: Soft touch sensation is symmetric on the face, arms, and legs.  Coordination: The patient has good finger-nose-finger and heel-to-shin bilaterally. Tinel's sign at the wrist is minimally positive on the right, negative on the left.  Gait and station: The patient has a slightly wide-based, unsteady gait. When using a walker, the patient has good stability with good stride, turns. Tandem gait was not tested. Romberg is negative.  No drift is seen.  Reflexes: Deep tendon reflexes are symmetric.   Assessment/Plan:  1. History of stroke with residual aphasia  2. Chronic gait disorder  3. Bilateral hand numbness, new onset  The patient may have bilateral carpal tunnel syndrome at this point. The patient will be treated with bilateral wrist splints over the next 6-8 weeks, if the symptoms do not improve, they are to contact our office and we will set up EMG and nerve conduction study evaluation. The patient otherwise will follow-up in 6 months.  Jill Alexanders MD 05/12/2016 2:42 PM  [facility] [address] [place],  [address]  Phone [phone] Fax [phone]

## 2016-06-07 ENCOUNTER — Encounter (HOSPITAL_COMMUNITY): Payer: Self-pay | Admitting: Emergency Medicine

## 2016-06-07 ENCOUNTER — Emergency Department (HOSPITAL_COMMUNITY): Payer: Medicare Other

## 2016-06-07 ENCOUNTER — Emergency Department (HOSPITAL_COMMUNITY)
Admission: EM | Admit: 2016-06-07 | Discharge: 2016-06-07 | Disposition: A | Discharge status: Home / Self Care | Payer: Medicare Other | Attending: Emergency Medicine | Admitting: Emergency Medicine

## 2016-06-07 DIAGNOSIS — Y999 Unspecified external cause status: Secondary | ICD-10-CM | POA: Insufficient documentation

## 2016-06-07 DIAGNOSIS — Z7982 Long term (current) use of aspirin: Secondary | ICD-10-CM | POA: Insufficient documentation

## 2016-06-07 DIAGNOSIS — Y9389 Activity, other specified: Secondary | ICD-10-CM | POA: Diagnosis not present

## 2016-06-07 DIAGNOSIS — Z8673 Personal history of transient ischemic attack (TIA), and cerebral infarction without residual deficits: Secondary | ICD-10-CM | POA: Diagnosis not present

## 2016-06-07 DIAGNOSIS — S0083XA Contusion of other part of head, initial encounter: Secondary | ICD-10-CM | POA: Insufficient documentation

## 2016-06-07 DIAGNOSIS — S40011A Contusion of right shoulder, initial encounter: Secondary | ICD-10-CM | POA: Diagnosis not present

## 2016-06-07 DIAGNOSIS — Y92002 Bathroom of unspecified non-institutional (private) residence single-family (private) house as the place of occurrence of the external cause: Secondary | ICD-10-CM | POA: Insufficient documentation

## 2016-06-07 DIAGNOSIS — W0110XA Fall on same level from slipping, tripping and stumbling with subsequent striking against unspecified object, initial encounter: Secondary | ICD-10-CM | POA: Diagnosis not present

## 2016-06-07 DIAGNOSIS — Z7722 Contact with and (suspected) exposure to environmental tobacco smoke (acute) (chronic): Secondary | ICD-10-CM | POA: Diagnosis not present

## 2016-06-07 DIAGNOSIS — N189 Chronic kidney disease, unspecified: Secondary | ICD-10-CM | POA: Diagnosis not present

## 2016-06-07 DIAGNOSIS — I129 Hypertensive chronic kidney disease with stage 1 through stage 4 chronic kidney disease, or unspecified chronic kidney disease: Secondary | ICD-10-CM | POA: Diagnosis not present

## 2016-06-07 DIAGNOSIS — S61511A Laceration without foreign body of right wrist, initial encounter: Secondary | ICD-10-CM

## 2016-06-07 DIAGNOSIS — W010XXA Fall on same level from slipping, tripping and stumbling without subsequent striking against object, initial encounter: Secondary | ICD-10-CM

## 2016-06-07 DIAGNOSIS — S6991XA Unspecified injury of right wrist, hand and finger(s), initial encounter: Secondary | ICD-10-CM | POA: Diagnosis present

## 2016-06-07 MED ORDER — ACETAMINOPHEN 325 MG PO TABS
650.0000 mg | ORAL_TABLET | Freq: Once | ORAL | Status: AC
  Administered 2016-06-07: 650 mg via ORAL
  Filled 2016-06-07: qty 2

## 2016-06-07 MED ORDER — BACITRACIN ZINC 500 UNIT/GM EX OINT
TOPICAL_OINTMENT | Freq: Two times a day (BID) | CUTANEOUS | Status: DC
  Administered 2016-06-07: 1 via TOPICAL
  Filled 2016-06-07: qty 0.9

## 2016-06-07 NOTE — ED Provider Notes (Signed)
[facility] DEPT Provider Note   CSN: [id number] Arrival date & time: 06/07/16  1044     History   Chief Complaint Chief Complaint  Patient presents with  . Fall    HPI Madison Berg is a 83 y.o. female.  Patient c/o trip and fall this AM.  States got up to bathroom at 6 AM, was trying to get legs out of/into pants, and tripped, fell onto right shoulder. Denies loc. Denies faintness or dizziness prior to fall. Family member helped up. Has been ambulatory since. Patient c/o right shoulder pain, mild-mod, persistent, worse w palpation/movement. Hx prior shoulder pain and rotator cuff injury. Denies headache. No neck or back pain. Denies chest pain or sob. No abd pain or nv. No numbness/weakness. Pt and family indicate her mental status is consistent with baseline.  Pt also with skin tear right wrist, tet utd.      The history is provided by the patient and a relative.    Past Medical History:  Diagnosis Date  . Anxiety   . Aphasia as late effect of stroke 04/03/2014  . Cervical spondylosis   . Chronic kidney disease   . CVA (cerebral infarction) 04/2009   LMCA & posterior cerebral - excellent recovery, mild expressive aphasa  . Diverticulitis   . Gait disorder 04/03/2014  . History of nuclear stress test 06/2009   dipyridamole; negative, normal pattern of perfusion   . Hypertension   . Multiple lung nodules   . Nephrolithiasis   . OA (osteoarthritis)   . Osteopenia   . PAF (paroxysmal atrial fibrillation) ([place])   . Valvular heart disease   . Vitamin D deficiency     Patient Active Problem List   Diagnosis Date Noted  . Paresthesia 05/12/2016  . Sinusitis, chronic 01/21/2016  . Chest pain, moderate coronary artery risk 06/24/2015  . Chest pain 12/14/2014  . Pain in joint, shoulder region 12/14/2014  . Acute-on-chronic kidney injury ([place]) 12/14/2014  . Head ache 04/03/2014  . Gait disorder 04/03/2014  . Aphasia as late effect of stroke 04/03/2014  . Acute on  chronic intracranial subdural hematoma ([facility]) 03/16/2014  . Subdural hematoma ([place]) 03/16/2014  . Low back pain 09/17/2013  . Aortic insufficiency 01/31/2013  . Stroke ([place]) 01/31/2013  . Leg length discrepancy 11/14/2012  . Acquired right flat foot 11/14/2012  . Multiple lung nodules on CT 01/14/2010  . Cough 08/13/2009  . Essential hypertension 03/28/2009  . Atrial fibrillation ([place]) 03/28/2009  . HEMORRHOIDS 03/28/2009  . DIVERTICULAR DISEASE 03/28/2009  . ARTHRITIS 03/28/2009  . MIGRAINES, HX OF 03/28/2009    Past Surgical History:  Procedure Laterality Date  . ABDOMINAL HYSTERECTOMY  1975  . APPENDECTOMY    . CATARACT EXTRACTION Bilateral 2013  . Instestinal Surgery  1987   r/t blockage  . NOSE SURGERY  1985   r/t deviated septum  . REPLACEMENT TOTAL KNEE Right 2004  . TRANSTHORACIC ECHOCARDIOGRAM  09/19/2012   EF 55-60% with normal systolic function; mod AR; MVP with mild-mod regurg; LA severely dilated; RV systolic pressure increased; mod dilated RA; RV systolic pressure increased - mild pulm HTN     OB History    No data available       Home Medications    Prior to Admission medications   Medication Sig Start Date End Date Taking? Authorizing Provider  acetaminophen (TYLENOL) 500 MG tablet Take 500 mg by mouth every 6 (six) hours as needed for mild pain.    Historical Provider, MD  alendronate (FOSAMAX) 70 MG tablet Take 70 mg by mouth once a week. Take with a full glass of water on an empty stomach.    Historical Provider, MD  amiodarone (PACERONE) 200 MG tablet TAKE 1 TABLET BY MOUTH EVERY DAY 02/16/16   Pixie Casino, MD  amLODipine (NORVASC) 10 MG tablet Take 10 mg by mouth at bedtime.     Historical Provider, MD  aspirin EC 81 MG tablet Take 81 mg by mouth daily.    Historical Provider, MD  B Complex-C (B-COMPLEX WITH VITAMIN C) tablet Take 1 tablet by mouth daily.    Historical Provider, MD  Cholecalciferol 1000 units tablet Take 1,000 Units by mouth  daily.    Historical Provider, MD  fluticasone (FLONASE) 50 MCG/ACT nasal spray Place 1 spray into both nostrils daily. 01/21/16   Javier Glazier, MD  levothyroxine (SYNTHROID, LEVOTHROID) 50 MCG tablet Take 50 mcg by mouth daily before breakfast.    Historical Provider, MD  metoprolol succinate (TOPROL-XL) 50 MG 24 hr tablet Take 1 tablet (50 mg total) by mouth daily. 04/30/16   Pixie Casino, MD  montelukast (SINGULAIR) 4 MG chewable tablet Chew 1 tablet (4 mg total) by mouth at bedtime. 01/21/16   Javier Glazier, MD  Multiple Vitamins-Minerals (MULTIVITAMIN & MINERAL PO) Take 1 tablet by mouth daily.    Historical Provider, MD  nitroGLYCERIN (NITROSTAT) 0.4 MG SL tablet Place 1 tablet (0.4 mg total) under the tongue every 5 (five) minutes as needed for chest pain. 07/02/15   Pixie Casino, MD  Omega-3 Fatty Acids (FISH OIL PO) Take 1 tablet by mouth at bedtime.    Historical Provider, MD  traMADol (ULTRAM) 50 MG tablet Take 1 tablet (50 mg total) by mouth every 6 (six) hours as needed. 01/12/16   Rolland Porter, MD    Family History Family History  Problem Relation Age of Onset  . Lymphoma Mother   . Tuberculosis Father   . Tuberculosis Brother   . Aneurysm Other     grandmother, uncles, cousins  . Lung disease Neg Hx     Social History Social History  Substance Use Topics  . Smoking status: Passive Smoke Exposure - Never Smoker  . Smokeless tobacco: Never Used     Comment: Exposed at work, with her husband, & with her father.  . Alcohol use No     Allergies   Amitriptyline hcl; Atenolol; Azithromycin; Cefuroxime; Daypro [oxaprozin]; Doxycycline; Famvir [famciclovir]; Hyzaar [losartan potassium-hctz]; Iodine; Metoprolol tartrate; Relafen [nabumetone]; and Sterapred [prednisone]   Review of Systems Review of Systems  Constitutional: Negative for fever.  HENT: Negative for nosebleeds.   Eyes: Negative for pain.  Respiratory: Negative for shortness of breath.     Cardiovascular: Negative for chest pain.  Gastrointestinal: Negative for vomiting.  Genitourinary: Negative for flank pain.  Musculoskeletal: Negative for back pain and neck pain.  Skin: Positive for wound.  Neurological: Negative for headaches.  Hematological:       No anticoag use.   Psychiatric/Behavioral: Negative for agitation.     Physical Exam Updated Vital Signs BP 151/55   Pulse (!) 50   Temp 97.6 F (36.4 C) (Oral)   Resp 17   Ht 5' (1.524 m)   Wt 54.4 kg   SpO2 100%   BMI 23.44 kg/m   Physical Exam  Constitutional: She appears well-developed and well-nourished. No distress.  HENT:  Bruise to left forehead/scalp.  Facial bones/orbits grossly intact.   Eyes: Conjunctivae are  normal. Pupils are equal, round, and reactive to light. No scleral icterus.  Neck: Normal range of motion. Neck supple. No tracheal deviation present.  Cardiovascular: Normal rate, regular rhythm, normal heart sounds and intact distal pulses.   Pulmonary/Chest: Effort normal and breath sounds normal. No respiratory distress. She exhibits no tenderness.  Abdominal: Soft. Normal appearance. She exhibits no distension. There is no tenderness.  Musculoskeletal: She exhibits no edema.  CTLS spine, non tender, aligned, no step off. Mild tenderness right shoulder. No deformity. Good rom bil ext, no other focal bony tenderness noted.   Neurological: She is alert.  Mental status c/w baseline per pt/family. Pt responds to questions appropriately. Moves bil ext purposefully w good strength.   Skin: Skin is warm and dry. No rash noted. She is not diaphoretic.  Skin tear to dorsum right wrist.   Psychiatric: She has a normal mood and affect.  Nursing note and vitals reviewed.    ED Treatments / Results  Labs (all labs ordered are listed, but only abnormal results are displayed) Labs Reviewed - No data to display  EKG  EKG Interpretation None       Radiology Dg Shoulder Right  Result  Date: 06/07/2016 CLINICAL DATA:  Right shoulder pain secondary to a fall. EXAM: RIGHT SHOULDER - 2+ VIEW COMPARISON:  None. FINDINGS: There is no fracture or dislocation. Osteopenia. Arthritic changes of the glenohumeral joint. Soft tissue swelling over the greater tuberosity. IMPRESSION: Soft tissue swelling.  No acute bone abnormality.  Osteopenia. Electronically Signed   By: Lorriane Shire M.D.   On: 06/07/2016 12:12    Procedures Procedures (including critical care time)  Medications Ordered in ED Medications  bacitracin ointment (not administered)  acetaminophen (TYLENOL) tablet 650 mg (not administered)     Initial Impression / Assessment and Plan / ED Course  I have reviewed the triage vital signs and the nursing notes.  Pertinent labs & imaging results that were available during my care of the patient were reviewed by me and considered in my medical decision making (see chart for details).  Reviewed nursing notes and prior charts for additional history.   Xrays.  Tylenol po.  Tetanus up to date.   Wound cleaned, bacitracin and sterile dressing.   Reviewed nursing notes and prior charts for additional history.     Final Clinical Impressions(s) / ED Diagnoses   Final diagnoses:  None    New Prescriptions New Prescriptions   No medications on file     Lajean Saver, MD 06/07/16 [phone]

## 2016-06-07 NOTE — ED Triage Notes (Addendum)
[place] this am   Hit her head above  Left eye,  Has a bruise there  Is not onblood thinners  ( off warfarin x 1 year she states) and fell on rt shoulder,  Pt states she does not  think she passed out but it is  un clear, states may have been putting on clothes has skin tear to rt foeearm

## 2016-06-07 NOTE — ED Notes (Signed)
PT comfortable with d/c and f/u instructions. Rx x0.

## 2016-06-07 NOTE — Discharge Instructions (Signed)
It was our pleasure to provide your ER care today - we hope that you feel better.  Keep wound/skin tear very clean - see skin tear instructions.  Take tylenol as need for pain.  Follow up with primary care doctor in 1 week.  Return to ER if worse, new symptoms, fevers, new or severe pain, other concern.

## 2016-08-05 ENCOUNTER — Ambulatory Visit: Payer: Medicare Other | Admitting: Neurology

## 2016-08-31 ENCOUNTER — Ambulatory Visit (INDEPENDENT_AMBULATORY_CARE_PROVIDER_SITE_OTHER): Payer: Medicare Other | Admitting: Internal Medicine

## 2016-08-31 ENCOUNTER — Encounter: Payer: Self-pay | Admitting: Internal Medicine

## 2016-08-31 VITALS — BP 148/62 | HR 57 | Ht 60.5 in | Wt 113.0 lb

## 2016-08-31 DIAGNOSIS — R42 Dizziness and giddiness: Secondary | ICD-10-CM | POA: Diagnosis not present

## 2016-08-31 DIAGNOSIS — I351 Nonrheumatic aortic (valve) insufficiency: Secondary | ICD-10-CM | POA: Diagnosis not present

## 2016-08-31 DIAGNOSIS — I10 Essential (primary) hypertension: Secondary | ICD-10-CM | POA: Diagnosis not present

## 2016-08-31 DIAGNOSIS — R55 Syncope and collapse: Secondary | ICD-10-CM | POA: Diagnosis not present

## 2016-08-31 DIAGNOSIS — I480 Paroxysmal atrial fibrillation: Secondary | ICD-10-CM

## 2016-08-31 NOTE — Progress Notes (Signed)
OFFICE NOTE  Chief Complaint:  Follow-up  Primary Care Physician: Horatio Pel, MD  HPI:  Madison Berg is a pleasant 83 year old female followed for years by Dr. [name] with a history of paroxysmal atrial fibrillation. She had a stroke in 2751 with an embolic event to her left middle cerebral artery and posterior cerebral artery. She recovered fairly well except she has some mild expressive aphasia. Chest is a history of aortic insufficiency which has been moderate and was recently stable on an echocardiogram in May of 2014. Her ejection fraction is preserved and she said no ischemia. She had a Myoview stress test in February 2011 which was negative. She is on warfarin therapy and her INRs have been therapeutic and stable. She has no complaints of chest pain or shortness of breath. She maintains on amiodarone for rhythm control with her atrial fibrillation. She had recent thyroid function which was in normal limits, but I do not see recent pulmonary function testing. She will need repeat of this testing at her next office visit. Her other main complaint today is problems with bladder incontinence. She previously had seen Dr. Elder Negus and he had advised against medications for incontinence, but her problems have gotten worse and she may be a good candidate for this medication. I understand that she may have had a bladder surgery in the past.  Madison Berg has no new complaints today. She denies any worsening shortness of breath or chest pain. She is again concerned about hair loss, however I do not appreciate any significant worsening of the thinning of her hair. She is asked whether it is okay for her to use biotin supplements. I do not have a problem with that. She has had a couple of more falls but is resistant to using a walker due to decreased mobility and independence. She is since stopped driving.  I saw Madison Berg back in the office today. Unfortunately since I last  saw her she continued to have gait instability and had numerous falls. Ultimately she developed a subdural hematoma and was felt to be a very poor candidate at that point for anticoagulation with warfarin. This was discontinued. She is maintained on aspirin.  Madison Berg is doing fairly well. She's had a couple of episodes of cerebral hemorrhage and was taken off of warfarin for this. She's had no falls recently and uses a 4 point walker. She fortunately has had very little A. fib and remains on low-dose amiodarone 200 mg daily.  I had the pleasure seeing Madison Berg back in the office today for follow-up. Recently she was seen in the ER for an episode of chest pain. She apparently took 2 nitroglycerin for this and her symptoms eventually resolved. She denies any burning under the tongue and therefore her medicine, which is probably quite old me not been effective. Since then she's had no further symptoms. She says she feels great today.  02/05/2016  Madison Berg returns today for follow-up. She's been very stable. She denies any recurrent chest pain. She is maintaining sinus rhythm on low-dose amiodarone and aspirin. Anticoagulation beyond this is contraindicated due to falls and chronic subdural hematomas. She has stable moderate aortic insufficiency with a wide pulse pressure and denies worsening shortness of breath with exertion. She's very concerned about chronic cough. She again asked today if her metoprolol was causing this. I'm unaware of this medicine causing this for her. Amiodarone could feasibly cause this if there were pulmonary fibrosis, however I do  not hear any clinical signs of that on exam. She is scheduled to have upcoming pulmonary function testing and a follow-up appointment with pulmonary.  08/31/2016  Madison Berg returns for follow-up. She reports a couple time she's had episodes where she felt like she was going to pass out. She becomes profoundly weak. Is not clear what her  blood pressure does during these episodes. It may be playing a role in her falls. She had another fall in February and therefore is not anticoagulated. She's been on amiodarone and maintaining sinus rhythm. She also is in bradycardia and this could be playing a role in her weakness as well.  PMHx:  Past Medical History:  Diagnosis Date  . Anxiety   . Aphasia as late effect of stroke 04/03/2014  . Cervical spondylosis   . Chronic kidney disease   . CVA (cerebral infarction) 04/2009   LMCA & posterior cerebral - excellent recovery, mild expressive aphasa  . Diverticulitis   . Gait disorder 04/03/2014  . History of nuclear stress test 06/2009   dipyridamole; negative, normal pattern of perfusion   . Hypertension   . Multiple lung nodules   . Nephrolithiasis   . OA (osteoarthritis)   . Osteopenia   . PAF (paroxysmal atrial fibrillation) ([place])   . Valvular heart disease   . Vitamin D deficiency     Past Surgical History:  Procedure Laterality Date  . ABDOMINAL HYSTERECTOMY  1975  . APPENDECTOMY    . CATARACT EXTRACTION Bilateral 2013  . Instestinal Surgery  1987   r/t blockage  . NOSE SURGERY  1985   r/t deviated septum  . REPLACEMENT TOTAL KNEE Right 2004  . TRANSTHORACIC ECHOCARDIOGRAM  09/19/2012   EF 55-60% with normal systolic function; mod AR; MVP with mild-mod regurg; LA severely dilated; RV systolic pressure increased; mod dilated RA; RV systolic pressure increased - mild pulm HTN     FAMHx:  Family History  Problem Relation Age of Onset  . Lymphoma Mother   . Tuberculosis Father   . Tuberculosis Brother   . Aneurysm Other     grandmother, uncles, cousins  . Lung disease Neg Hx     SOCHx:   reports that she is a non-smoker but has been exposed to tobacco smoke. She has never used smokeless tobacco. She reports that she does not drink alcohol or use drugs.  ALLERGIES:  Allergies  Allergen Reactions  . Amitriptyline Hcl Itching  . Atenolol Other (See Comments)     Hair loss, thin nails, itching  . Azithromycin Other (See Comments)    Mouth sores; sore throat  . Cefuroxime   . Daypro [Oxaprozin] Other (See Comments)    dyspepsia  . Doxycycline Other (See Comments)    Mouth sores; sore throat  . Famvir [Famciclovir]   . Hyzaar [Losartan Potassium-Hctz] Other (See Comments)    Headache; tight face  . Iodine Other (See Comments)    Involuntary muscle contractions  . Metoprolol Tartrate Itching  . Relafen [Nabumetone] Other (See Comments)    Mouth sores  . Sterapred [Prednisone] Other (See Comments)    weakness    ROS: Pertinent items noted in HPI and remainder of comprehensive ROS otherwise negative.  HOME MEDS: Current Outpatient Prescriptions  Medication Sig Dispense Refill  . acetaminophen (TYLENOL) 500 MG tablet Take 500 mg by mouth every 6 (six) hours as needed for mild pain.    Marland Kitchen alendronate (FOSAMAX) 70 MG tablet Take 70 mg by mouth once a week. Take  with a full glass of water on an empty stomach.    Marland Kitchen amiodarone (PACERONE) 200 MG tablet TAKE 1 TABLET BY MOUTH EVERY DAY 30 tablet 11  . amLODipine (NORVASC) 10 MG tablet Take 10 mg by mouth at bedtime.     Marland Kitchen aspirin EC 81 MG tablet Take 81 mg by mouth daily.    . B Complex-C (B-COMPLEX WITH VITAMIN C) tablet Take 1 tablet by mouth daily.    . Cholecalciferol 1000 units tablet Take 1,000 Units by mouth daily.    . fluticasone (FLONASE) 50 MCG/ACT nasal spray Place 1 spray into both nostrils daily. 16 g 2  . levothyroxine (SYNTHROID, LEVOTHROID) 50 MCG tablet Take 50 mcg by mouth daily before breakfast.    . metoprolol succinate (TOPROL-XL) 50 MG 24 hr tablet Take 1 tablet (50 mg total) by mouth daily. 30 tablet 8  . montelukast (SINGULAIR) 4 MG chewable tablet Chew 1 tablet (4 mg total) by mouth at bedtime. 30 tablet 3  . Multiple Vitamins-Minerals (MULTIVITAMIN & MINERAL PO) Take 1 tablet by mouth daily.    . nitroGLYCERIN (NITROSTAT) 0.4 MG SL tablet Place 1 tablet (0.4 mg total) under  the tongue every 5 (five) minutes as needed for chest pain. 25 tablet 3  . Omega-3 Fatty Acids (FISH OIL PO) Take 1 tablet by mouth at bedtime.    . traMADol (ULTRAM) 50 MG tablet Take 1 tablet (50 mg total) by mouth every 6 (six) hours as needed. 20 tablet 0   No current facility-administered medications for this visit.     LABS/IMAGING: No results found for this or any previous visit (from the past 48 hour(s)). No results found.  VITALS: BP (!) 148/62   Pulse (!) 57   Ht 5' 0.5" (1.537 m)   Wt 113 lb (51.3 kg)   BMI 21.71 kg/m   EXAM: General appearance: alert and no distress Neck: no carotid bruit and no JVD Lungs: clear to auscultation bilaterally Heart: regular rate and rhythm, S1, S2 normal and diastolic murmur: holodiastolic 2/6, blowing at 2nd right intercostal space Abdomen: soft, non-tender; bowel sounds normal; no masses,  no organomegaly Extremities: extremities normal, atraumatic, no cyanosis or edema Pulses: 2+ and symmetric Skin: Skin color, texture, turgor normal. No rashes or lesions Neurologic: Grossly normal, marked expressive aphasia Psych: Mood, affect normal  EKG: Sinus bradycardia with first-degree AV block at 55  ASSESSMENT: 1. Stable moderate aortic insufficiency 2. Paroxysmal atrial fibrillation - CHADSVASC 4- on aspirin and amiodarone 3. Hypertension-controlled 4. Dizziness with falls 5. Chronic subdural hematomas - warfarin is contraindicated 6. Chest pain 7. Chronic cough 8. Presyncope  PLAN: 1.   Madison Berg has been having some presyncopal episodes. This may be due to labile hypertension. At time she might be hypotensive and I asked her to have her family check her blood pressure when she feels like she may pass out. Heart rate is in the low 50s and perhaps we could allow a little higher heart rate. She is on beta blocker but I like to decrease her exposure to amiodarone and decreased the dose to 100 mg daily. If she continues to have some  low blood pressures remain further decrease metoprolol or amlodipine.  Follow-up in 6 months.   Pixie Casino, MD, [facility] Attending Cardiologist [place] 08/31/2016, 3:58 PM

## 2016-08-31 NOTE — Patient Instructions (Signed)
Your physician has recommended you make the following change in your medication:  -- DECREASE amiodarone to 100mg  daily  Dr. Debara Pickett has recommended that you monitor your blood pressure at home when you feel like you will pass out  Your physician wants you to follow-up in: 6 months with Dr. Debara Pickett. You will receive a reminder letter in the mail two months in advance. If you don't receive a letter, please call our office to schedule the follow-up appointment.

## 2016-09-24 ENCOUNTER — Emergency Department (HOSPITAL_COMMUNITY)
Admission: EM | Admit: 2016-09-24 | Discharge: 2016-09-24 | Disposition: A | Discharge status: Home / Self Care | Payer: Medicare Other | Attending: Emergency Medicine | Admitting: Emergency Medicine

## 2016-09-24 ENCOUNTER — Encounter (HOSPITAL_COMMUNITY): Payer: Self-pay | Admitting: Emergency Medicine

## 2016-09-24 DIAGNOSIS — R42 Dizziness and giddiness: Secondary | ICD-10-CM | POA: Diagnosis present

## 2016-09-24 DIAGNOSIS — Z7722 Contact with and (suspected) exposure to environmental tobacco smoke (acute) (chronic): Secondary | ICD-10-CM | POA: Diagnosis not present

## 2016-09-24 DIAGNOSIS — Z96651 Presence of right artificial knee joint: Secondary | ICD-10-CM | POA: Insufficient documentation

## 2016-09-24 DIAGNOSIS — I129 Hypertensive chronic kidney disease with stage 1 through stage 4 chronic kidney disease, or unspecified chronic kidney disease: Secondary | ICD-10-CM | POA: Diagnosis not present

## 2016-09-24 DIAGNOSIS — N179 Acute kidney failure, unspecified: Secondary | ICD-10-CM | POA: Diagnosis not present

## 2016-09-24 DIAGNOSIS — N189 Chronic kidney disease, unspecified: Secondary | ICD-10-CM | POA: Insufficient documentation

## 2016-09-24 DIAGNOSIS — Z79899 Other long term (current) drug therapy: Secondary | ICD-10-CM | POA: Diagnosis not present

## 2016-09-24 DIAGNOSIS — Z7982 Long term (current) use of aspirin: Secondary | ICD-10-CM | POA: Diagnosis not present

## 2016-09-24 LAB — BASIC METABOLIC PANEL
Anion gap: 10 (ref 5–15)
BUN: 65 mg/dL — AB (ref 6–20)
CHLORIDE: 105 mmol/L (ref 101–111)
CO2: 23 mmol/L (ref 22–32)
Calcium: 9.3 mg/dL (ref 8.9–10.3)
Creatinine, Ser: 2.35 mg/dL — ABNORMAL HIGH (ref 0.44–1.00)
GFR calc Af Amer: 20 mL/min — ABNORMAL LOW (ref >60)
GFR calc non Af Amer: 17 mL/min — ABNORMAL LOW (ref >60)
Glucose, Bld: 100 mg/dL — ABNORMAL HIGH (ref 65–99)
POTASSIUM: 4.3 mmol/L (ref 3.5–5.1)
SODIUM: 138 mmol/L (ref 135–145)

## 2016-09-24 LAB — URINALYSIS, ROUTINE W REFLEX MICROSCOPIC
Bilirubin Urine: NEGATIVE
GLUCOSE, UA: NEGATIVE mg/dL
Hgb urine dipstick: NEGATIVE
Ketones, ur: NEGATIVE mg/dL
Leukocytes, UA: NEGATIVE
Nitrite: NEGATIVE
PH: 5 (ref 5.0–8.0)
Protein, ur: NEGATIVE mg/dL
Specific Gravity, Urine: 1.01 (ref 1.005–1.030)

## 2016-09-24 LAB — CBC
HEMATOCRIT: 37.4 % (ref 36.0–46.0)
Hemoglobin: 12.8 g/dL (ref 12.0–15.0)
MCH: 33 pg (ref 26.0–34.0)
MCHC: 34.2 g/dL (ref 30.0–36.0)
MCV: 96.4 fL (ref 78.0–100.0)
Platelets: 254 10*3/uL (ref 150–400)
RBC: 3.88 MIL/uL (ref 3.87–5.11)
RDW: 15 % (ref 11.5–15.5)
WBC: 8.5 10*3/uL (ref 4.0–10.5)

## 2016-09-24 LAB — CBG MONITORING, ED: Glucose-Capillary: 94 mg/dL (ref 65–99)

## 2016-09-24 MED ORDER — SODIUM CHLORIDE 0.9 % IV BOLUS (SEPSIS)
500.0000 mL | Freq: Once | INTRAVENOUS | Status: AC
  Administered 2016-09-24: 500 mL via INTRAVENOUS

## 2016-09-24 NOTE — ED Notes (Signed)
Pt given water, crackers, and [place] sandwich.

## 2016-09-24 NOTE — ED Provider Notes (Signed)
[facility] DEPT Provider Note   CSN: [id number] Arrival date & time: 09/24/16  1136     History   Chief Complaint Chief Complaint  Patient presents with  . Abnormal Lab    HPI Madison Berg is a 83 y.o. female.  She is here for evaluation of elevated creatinine, which followed being treated with Lasix for 1 week, for peripheral edema.  Initial creatinine was 1.4, today it was 2.6.  Patient has also noted 5 pound weight loss during this period of time.  She currently has some mild dizziness with ambulation but is able to ambulate, using her walker, as usual.  She denies nausea, vomiting, anorexia, headache, chest pain or back pain.  She states the swelling in her legs has resolved.  She states that she does not drink much water because of frequent urinary incontinence.  There are no other known modifying factors.  HPI  Past Medical History:  Diagnosis Date  . Anxiety   . Aphasia as late effect of stroke 04/03/2014  . Cervical spondylosis   . Chronic kidney disease   . CVA (cerebral infarction) 04/2009   LMCA & posterior cerebral - excellent recovery, mild expressive aphasa  . Diverticulitis   . Gait disorder 04/03/2014  . History of nuclear stress test 06/2009   dipyridamole; negative, normal pattern of perfusion   . Hypertension   . Multiple lung nodules   . Nephrolithiasis   . OA (osteoarthritis)   . Osteopenia   . PAF (paroxysmal atrial fibrillation) ([place])   . Valvular heart disease   . Vitamin D deficiency     Patient Active Problem List   Diagnosis Date Noted  . Postural dizziness with presyncope 08/31/2016  . Paresthesia 05/12/2016  . Sinusitis, chronic 01/21/2016  . Chest pain, moderate coronary artery risk 06/24/2015  . Chest pain 12/14/2014  . Pain in joint, shoulder region 12/14/2014  . Acute-on-chronic kidney injury ([place]) 12/14/2014  . Head ache 04/03/2014  . Gait disorder 04/03/2014  . Aphasia as late effect of stroke 04/03/2014  . Acute on  chronic intracranial subdural hematoma ([facility]) 03/16/2014  . Subdural hematoma ([place]) 03/16/2014  . Low back pain 09/17/2013  . Aortic insufficiency 01/31/2013  . Stroke ([place]) 01/31/2013  . Leg length discrepancy 11/14/2012  . Acquired right flat foot 11/14/2012  . Multiple lung nodules on CT 01/14/2010  . Cough 08/13/2009  . Essential hypertension 03/28/2009  . Atrial fibrillation ([place]) 03/28/2009  . HEMORRHOIDS 03/28/2009  . DIVERTICULAR DISEASE 03/28/2009  . ARTHRITIS 03/28/2009  . MIGRAINES, HX OF 03/28/2009    Past Surgical History:  Procedure Laterality Date  . ABDOMINAL HYSTERECTOMY  1975  . APPENDECTOMY    . CATARACT EXTRACTION Bilateral 2013  . Instestinal Surgery  1987   r/t blockage  . NOSE SURGERY  1985   r/t deviated septum  . REPLACEMENT TOTAL KNEE Right 2004  . TRANSTHORACIC ECHOCARDIOGRAM  09/19/2012   EF 55-60% with normal systolic function; mod AR; MVP with mild-mod regurg; LA severely dilated; RV systolic pressure increased; mod dilated RA; RV systolic pressure increased - mild pulm HTN     OB History    No data available       Home Medications    Prior to Admission medications   Medication Sig Start Date End Date Taking? Authorizing Provider  alendronate (FOSAMAX) 70 MG tablet Take 70 mg by mouth once a week. Take with a full glass of water on an empty stomach.   Yes [provider]  amiodarone (PACERONE) 200 MG tablet Take 200 mg by mouth daily.    Yes [provider]  amLODipine (NORVASC) 10 MG tablet Take 10 mg by mouth at bedtime.    Yes [provider]  aspirin EC 81 MG tablet Take 81 mg by mouth daily.   Yes [provider]  B Complex-C (B-COMPLEX WITH VITAMIN C) tablet Take 1 tablet by mouth daily.   Yes [provider]  Cholecalciferol 1000 units tablet Take 1,000 Units by mouth daily.   Yes [provider]  furosemide (LASIX) 20 MG tablet Take 20 mg by mouth daily as needed. 09/15/16  Yes  [provider]  levothyroxine (SYNTHROID, LEVOTHROID) 50 MCG tablet Take 50 mcg by mouth daily before breakfast.   Yes [provider]  metoprolol succinate (TOPROL-XL) 50 MG 24 hr tablet Take 1 tablet (50 mg total) by mouth daily. 04/30/16  Yes Hilty, Nadean Corwin, MD  Multiple Vitamins-Minerals (MULTIVITAMIN & MINERAL PO) Take 1 tablet by mouth daily.   Yes [provider]  Omega-3 Fatty Acids (FISH OIL PO) Take 1 tablet by mouth at bedtime.   Yes [provider]  nitroGLYCERIN (NITROSTAT) 0.4 MG SL tablet Place 1 tablet (0.4 mg total) under the tongue every 5 (five) minutes as needed for chest pain. 07/02/15   Hilty, Nadean Corwin, MD    Family History Family History  Problem Relation Age of Onset  . Lymphoma Mother   . Tuberculosis Father   . Tuberculosis Brother   . Aneurysm Other        grandmother, uncles, cousins  . Lung disease Neg Hx     Social History Social History  Substance Use Topics  . Smoking status: Passive Smoke Exposure - Never Smoker  . Smokeless tobacco: Never Used     Comment: Exposed at work, with her husband, & with her father.  . Alcohol use No     Allergies   Amitriptyline hcl; Atenolol; Azithromycin; Cefuroxime; Daypro [oxaprozin]; Doxycycline; Famvir [famciclovir]; Hyzaar [losartan potassium-hctz]; Iodine; Metoprolol tartrate; Relafen [nabumetone]; and Sterapred [prednisone]   Review of Systems Review of Systems  All other systems reviewed and are negative.    Physical Exam Updated Vital Signs BP (!) 155/68 (BP Location: Right Arm)   Pulse (!) 51   Temp 97.8 F (36.6 C) (Oral)   Resp 16   Ht 5' (1.524 m)   Wt 51.3 kg (113 lb)   SpO2 96%   BMI 22.07 kg/m   Physical Exam  Constitutional: She is oriented to person, place, and time. She appears well-developed. No distress.  Elderly, frail  HENT:  Head: Normocephalic and atraumatic.  Right Ear: External ear normal.  Left Ear: External ear normal.  Eyes:  Conjunctivae and EOM are normal. Pupils are equal, round, and reactive to light.  Neck: Normal range of motion and phonation normal. Neck supple.  Cardiovascular: Normal rate, regular rhythm and normal heart sounds.   Pulmonary/Chest: Effort normal and breath sounds normal. She exhibits no bony tenderness.  Abdominal: Soft. There is no tenderness.  Musculoskeletal: Normal range of motion. She exhibits no edema or deformity.  Neurological: She is alert and oriented to person, place, and time. No cranial nerve deficit or sensory deficit. She exhibits normal muscle tone. Coordination normal.  Skin: Skin is warm, dry and intact.  Psychiatric: She has a normal mood and affect. Her behavior is normal. Judgment and thought content normal.  Nursing note and vitals reviewed.    ED Treatments /  Results  Labs (all labs ordered are listed, but only abnormal results are displayed) Labs Reviewed  BASIC METABOLIC PANEL - Abnormal; Notable for the following:       Result Value   Glucose, Bld 100 (*)    BUN 65 (*)    Creatinine, Ser 2.35 (*)    GFR calc non Af Amer 17 (*)    GFR calc Af Amer 20 (*)    All other components within normal limits  URINALYSIS, ROUTINE W REFLEX MICROSCOPIC - Abnormal; Notable for the following:    Color, Urine STRAW (*)    All other components within normal limits  CBC  CBG MONITORING, ED    EKG  EKG Interpretation  Date/Time:  Friday Sep 24 2016 12:20:58 EDT Ventricular Rate:  52 PR Interval:    QRS Duration: 105 QT Interval:  528 QTC Calculation: 492 R Axis:   37 Text Interpretation:  Sinus rhythm Short PR interval Borderline ST depression, diffuse leads Borderline prolonged QT interval Since last tracing QT has lengthened Confirmed by Daleen Bo [id number]) on 09/24/2016 1:33:26 PM       Radiology No results found.  Procedures Procedures (including critical care time)  Medications Ordered in ED Medications  sodium chloride 0.9 % bolus 500 mL (0 mLs  Intravenous Stopped 09/24/16 1350)     Initial Impression / Assessment and Plan / ED Course  I have reviewed the triage vital signs and the nursing notes.  Pertinent labs & imaging results that were available during my care of the patient were reviewed by me and considered in my medical decision making (see chart for details).      Patient Vitals for the past 24 hrs:  BP Temp Temp src Pulse Resp SpO2 Height Weight  09/24/16 1358 (!) 155/68 - - (!) 51 16 96 % - -  09/24/16 1145 - - - - - - 5' (1.524 m) 51.3 kg (113 lb)  09/24/16 1142 (!) 160/57 97.8 F (36.6 C) Oral 98 16 96 % - -    2:37 PM Reevaluation with update and discussion. After initial assessment and treatment, an updated evaluation reveals patient is comfortable tolerating oral fluids.  Findings discussed with patient and son, all questions answered. Davionte Lusby L    Final Clinical Impressions(s) / ED Diagnoses   Final diagnoses:  AKI (acute kidney injury) ([place])   Mild elevation of creatinine, from baseline.  Patient nontoxic.  Doubt significant volume depletion, serious bacterial infection or metabolic instability.  Nursing Notes Reviewed/ Care Coordinated Applicable Imaging Reviewed Interpretation of Laboratory Data incorporated into ED treatment  The patient appears reasonably screened and/or stabilized for discharge and I doubt any other medical condition or other [facility] requiring further screening, evaluation, or treatment in the ED at this time prior to discharge.  Plan: Home Medications-continue usual medications; Home Treatments-rest, increase oral fluids to 2 or 3 glasses of water each day. Stay on a low-sodium diet; return here if the recommended treatment, does not improve the symptoms; Recommended follow up-PCP for checkup and likely repeat creatinine level, next week   New Prescriptions New Prescriptions   No medications on file     Daleen Bo, MD 09/24/16 1438

## 2016-09-24 NOTE — ED Triage Notes (Signed)
Per pt, she's coming from [facility] with an abnormal creatinine.  Pt states she has been fatigued the last few days. Pt also c/o burning in her legs and soles of her feet.   Pt is alert and oriented x 4 and ambulatory with assistance.

## 2016-09-24 NOTE — ED Notes (Signed)
Pt tolerated PO fluids and food well w/o difficulty.

## 2016-09-24 NOTE — Discharge Instructions (Signed)
Your creatinine level is slightly elevated, because of taking the Lasix.  Since you have stopped it, your creatinine will likely improve.  Stay on low-sodium diet, and drink 2 or 3 glasses of water each day, to improve your renal function.  Return here if needed for problems.

## 2016-11-23 ENCOUNTER — Encounter: Payer: Self-pay | Admitting: Neurology

## 2016-11-23 ENCOUNTER — Ambulatory Visit (INDEPENDENT_AMBULATORY_CARE_PROVIDER_SITE_OTHER): Payer: Medicare Other | Admitting: Neurology

## 2016-11-23 VITALS — BP 158/55 | HR 55 | Ht 60.0 in | Wt 111.5 lb

## 2016-11-23 DIAGNOSIS — I69320 Aphasia following cerebral infarction: Secondary | ICD-10-CM

## 2016-11-23 DIAGNOSIS — R269 Unspecified abnormalities of gait and mobility: Secondary | ICD-10-CM | POA: Diagnosis not present

## 2016-11-23 NOTE — Progress Notes (Signed)
Reason for visit: Gait disorder  Madison Berg is an 83 y.o. female  History of present illness:  Madison Berg is a 83 year old right-handed white female with a history of a gait disorder in part likely related to a peripheral neuropathy. The patient has complained of bilateral hand numbness, she tried to use wrist splints but this made the pain worse. The patient indicates that she is not having a lot of discomfort otherwise and she can live with the numbness. The patient also had restless leg syndrome and she was placed on low-dose gabapentin taking 100 mg in the evening hours which was very beneficial. The patient has fallen on 3 occasions since last seen, but she is now using a walker on a regular basis which has offered a significant benefit with her safety while walking. The patient lives with 2 of her sons, there is always someone around to help her out at this point. The patient was in the emergency room on the fifth of February 2018 following a fall, she was also in the ER in early May 2018 with what sounds like dehydration and worsening renal function.  Past Medical History:  Diagnosis Date  . Anxiety   . Aphasia as late effect of stroke 04/03/2014  . Cervical spondylosis   . Chronic kidney disease   . CVA (cerebral infarction) 04/2009   LMCA & posterior cerebral - excellent recovery, mild expressive aphasa  . Diverticulitis   . Gait disorder 04/03/2014  . History of nuclear stress test 06/2009   dipyridamole; negative, normal pattern of perfusion   . Hypertension   . Multiple lung nodules   . Nephrolithiasis   . OA (osteoarthritis)   . Osteopenia   . PAF (paroxysmal atrial fibrillation) ([place])   . Valvular heart disease   . Vitamin D deficiency     Past Surgical History:  Procedure Laterality Date  . ABDOMINAL HYSTERECTOMY  1975  . APPENDECTOMY    . CATARACT EXTRACTION Bilateral 2013  . Instestinal Surgery  1987   r/t blockage  . NOSE SURGERY  1985   r/t  deviated septum  . REPLACEMENT TOTAL KNEE Right 2004  . TRANSTHORACIC ECHOCARDIOGRAM  09/19/2012   EF 55-60% with normal systolic function; mod AR; MVP with mild-mod regurg; LA severely dilated; RV systolic pressure increased; mod dilated RA; RV systolic pressure increased - mild pulm HTN     Family History  Problem Relation Age of Onset  . Lymphoma Mother   . Tuberculosis Father   . Tuberculosis Brother   . Aneurysm Other        grandmother, uncles, cousins  . Lung disease Neg Hx     Social history:  reports that she is a non-smoker but has been exposed to tobacco smoke. She has never used smokeless tobacco. She reports that she does not drink alcohol or use drugs.    Allergies  Allergen Reactions  . Amitriptyline Hcl Itching  . Atenolol Other (See Comments)    Hair loss, thin nails, itching  . Azithromycin Other (See Comments)    Mouth sores; sore throat  . Cefuroxime   . Daypro [Oxaprozin] Other (See Comments)    dyspepsia  . Doxycycline Other (See Comments)    Mouth sores; sore throat  . Famvir [Famciclovir]   . Hyzaar [Losartan Potassium-Hctz] Other (See Comments)    Headache; tight face  . Iodine Other (See Comments)    Involuntary muscle contractions  . Metoprolol Tartrate Itching  . Relafen [Nabumetone]  Other (See Comments)    Mouth sores  . Sterapred [Prednisone] Other (See Comments)    weakness    Medications:  Prior to Admission medications   Medication Sig Start Date End Date Taking? Authorizing Provider  alendronate (FOSAMAX) 70 MG tablet Take 70 mg by mouth once a week. Take with a full glass of water on an empty stomach.   Yes [provider]  amiodarone (PACERONE) 200 MG tablet Take 200 mg by mouth daily.    Yes [provider]  amLODipine (NORVASC) 10 MG tablet Take 10 mg by mouth at bedtime.    Yes [provider]  aspirin EC 81 MG tablet Take 81 mg by mouth daily.   Yes [provider]  B Complex-C (B-COMPLEX WITH  VITAMIN C) tablet Take 1 tablet by mouth daily.   Yes [provider]  Cholecalciferol 1000 units tablet Take 1,000 Units by mouth daily.   Yes [provider]  furosemide (LASIX) 20 MG tablet Take 20 mg by mouth daily as needed. 09/15/16  Yes [provider]  gabapentin (NEURONTIN) 100 MG capsule TAKE 1 CAPSULE AS DIRECTED AT BEDTIME 11/12/16  Yes [provider]  levothyroxine (SYNTHROID, LEVOTHROID) 50 MCG tablet Take 50 mcg by mouth daily before breakfast.   Yes [provider]  metoprolol succinate (TOPROL-XL) 50 MG 24 hr tablet Take 1 tablet (50 mg total) by mouth daily. 04/30/16  Yes Hilty, Nadean Corwin, MD  Multiple Vitamins-Minerals (MULTIVITAMIN & MINERAL PO) Take 1 tablet by mouth daily.   Yes [provider]  nitroGLYCERIN (NITROSTAT) 0.4 MG SL tablet Place 1 tablet (0.4 mg total) under the tongue every 5 (five) minutes as needed for chest pain. 07/02/15  Yes Hilty, Nadean Corwin, MD  Omega-3 Fatty Acids (FISH OIL PO) Take 1 tablet by mouth at bedtime.   Yes [provider]  cephALEXin (KEFLEX) 500 MG capsule  11/22/16   [provider]    ROS:  Out of a complete 14 system review of symptoms, the patient complains only of the following symptoms, and all other reviewed systems are negative.  Ringing in the ears, runny nose Cough Rectal bleeding, constipation Sleepwalking Incontinence of the bladder Joint pain, walking difficulty, neck stiffness Dizziness, speech difficulty  Blood pressure (!) 158/55, pulse (!) 55, height 5' (1.524 m), weight 111 lb 8 oz (50.6 kg), SpO2 98 %.  Physical Exam  General: The patient is alert and cooperative at the time of the examination.  Skin: 1+ edema below the knees is seen bilaterally.   Neurologic Exam  Mental status: The patient is alert and oriented x 3 at the time of the examination. The patient has apparent normal recent and remote memory, with an apparently normal  attention span and concentration ability.   Cranial nerves: Facial symmetry is present. Speech is associated with a mild aphasia, no dysarthria noted. Extraocular movements are full. Visual fields are full.  Motor: The patient has good strength in all 4 extremities.  Sensory examination: Soft touch sensation is symmetric on the face, arms, and legs.  Coordination: The patient has good finger-nose-finger and heel-to-shin bilaterally.  Gait and station: The patient has a wide-based, unsteady gait. With a walker, the patient has good stability and good stride and good turns. Tandem gait was not attempted. Romberg is negative. No drift is seen.  Reflexes: Deep tendon reflexes are symmetric, but are depressed.   Assessment/Plan:  1. Gait disorder  2. Cerebrovascular disease with residual aphasia  3. Bilateral hand numbness, possible carpal tunnel syndrome  The patient wants to put off any evaluation for her hand numbness. She is not having a lot of discomfort from the numbness. She has much better gait stability when using her walker, she will continue this to help prevent falls. She will follow-up in about 8 months. She has gained good improvement with gabapentin with her restless leg syndrome.   Jill Alexanders MD 11/23/2016 2:39 PM  [facility] [address] [place], [place] [address]  Phone [phone] Fax [phone]

## 2016-12-04 ENCOUNTER — Encounter (HOSPITAL_COMMUNITY): Payer: Self-pay | Admitting: Emergency Medicine

## 2016-12-04 ENCOUNTER — Observation Stay (HOSPITAL_COMMUNITY)
Admission: EM | Admit: 2016-12-04 | Discharge: 2016-12-06 | Disposition: A | Discharge status: Home / Self Care | Payer: Medicare Other | Attending: Family Medicine | Admitting: Family Medicine

## 2016-12-04 DIAGNOSIS — Z79899 Other long term (current) drug therapy: Secondary | ICD-10-CM | POA: Insufficient documentation

## 2016-12-04 DIAGNOSIS — R7989 Other specified abnormal findings of blood chemistry: Secondary | ICD-10-CM | POA: Insufficient documentation

## 2016-12-04 DIAGNOSIS — Z96651 Presence of right artificial knee joint: Secondary | ICD-10-CM | POA: Diagnosis not present

## 2016-12-04 DIAGNOSIS — N200 Calculus of kidney: Secondary | ICD-10-CM | POA: Diagnosis not present

## 2016-12-04 DIAGNOSIS — J90 Pleural effusion, not elsewhere classified: Secondary | ICD-10-CM | POA: Diagnosis not present

## 2016-12-04 DIAGNOSIS — K5730 Diverticulosis of large intestine without perforation or abscess without bleeding: Secondary | ICD-10-CM | POA: Insufficient documentation

## 2016-12-04 DIAGNOSIS — I10 Essential (primary) hypertension: Secondary | ICD-10-CM | POA: Diagnosis present

## 2016-12-04 DIAGNOSIS — I480 Paroxysmal atrial fibrillation: Secondary | ICD-10-CM | POA: Diagnosis not present

## 2016-12-04 DIAGNOSIS — E039 Hypothyroidism, unspecified: Secondary | ICD-10-CM | POA: Insufficient documentation

## 2016-12-04 DIAGNOSIS — Z7722 Contact with and (suspected) exposure to environmental tobacco smoke (acute) (chronic): Secondary | ICD-10-CM | POA: Insufficient documentation

## 2016-12-04 DIAGNOSIS — I4891 Unspecified atrial fibrillation: Secondary | ICD-10-CM | POA: Diagnosis present

## 2016-12-04 DIAGNOSIS — N183 Chronic kidney disease, stage 3 (moderate): Secondary | ICD-10-CM | POA: Insufficient documentation

## 2016-12-04 DIAGNOSIS — Z7982 Long term (current) use of aspirin: Secondary | ICD-10-CM | POA: Diagnosis not present

## 2016-12-04 DIAGNOSIS — I69320 Aphasia following cerebral infarction: Secondary | ICD-10-CM | POA: Diagnosis not present

## 2016-12-04 DIAGNOSIS — E871 Hypo-osmolality and hyponatremia: Secondary | ICD-10-CM | POA: Diagnosis not present

## 2016-12-04 DIAGNOSIS — R001 Bradycardia, unspecified: Principal | ICD-10-CM | POA: Diagnosis present

## 2016-12-04 DIAGNOSIS — I129 Hypertensive chronic kidney disease with stage 1 through stage 4 chronic kidney disease, or unspecified chronic kidney disease: Secondary | ICD-10-CM | POA: Diagnosis not present

## 2016-12-04 DIAGNOSIS — R300 Dysuria: Secondary | ICD-10-CM | POA: Diagnosis present

## 2016-12-04 DIAGNOSIS — D649 Anemia, unspecified: Secondary | ICD-10-CM | POA: Diagnosis not present

## 2016-12-04 DIAGNOSIS — R531 Weakness: Secondary | ICD-10-CM

## 2016-12-04 DIAGNOSIS — R1032 Left lower quadrant pain: Secondary | ICD-10-CM | POA: Diagnosis not present

## 2016-12-04 DIAGNOSIS — S065XAA Traumatic subdural hemorrhage with loss of consciousness status unknown, initial encounter: Secondary | ICD-10-CM | POA: Diagnosis present

## 2016-12-04 DIAGNOSIS — S065X9A Traumatic subdural hemorrhage with loss of consciousness of unspecified duration, initial encounter: Secondary | ICD-10-CM | POA: Diagnosis present

## 2016-12-04 LAB — URINALYSIS, ROUTINE W REFLEX MICROSCOPIC
BILIRUBIN URINE: NEGATIVE
GLUCOSE, UA: NEGATIVE mg/dL
HGB URINE DIPSTICK: NEGATIVE
Ketones, ur: NEGATIVE mg/dL
Leukocytes, UA: NEGATIVE
Nitrite: NEGATIVE
Protein, ur: 30 mg/dL — AB
SPECIFIC GRAVITY, URINE: 1.02 (ref 1.005–1.030)
pH: 5.5 (ref 5.0–8.0)

## 2016-12-04 LAB — CBC
HEMATOCRIT: 34.6 % — AB (ref 36.0–46.0)
Hemoglobin: 11.9 g/dL — ABNORMAL LOW (ref 12.0–15.0)
MCH: 31.8 pg (ref 26.0–34.0)
MCHC: 34.4 g/dL (ref 30.0–36.0)
MCV: 92.5 fL (ref 78.0–100.0)
Platelets: 266 10*3/uL (ref 150–400)
RBC: 3.74 MIL/uL — ABNORMAL LOW (ref 3.87–5.11)
RDW: 15.7 % — AB (ref 11.5–15.5)
WBC: 8.3 10*3/uL (ref 4.0–10.5)

## 2016-12-04 LAB — BASIC METABOLIC PANEL
Anion gap: 9 (ref 5–15)
BUN: 42 mg/dL — AB (ref 6–20)
CHLORIDE: 98 mmol/L — AB (ref 101–111)
CO2: 19 mmol/L — ABNORMAL LOW (ref 22–32)
Calcium: 9.2 mg/dL (ref 8.9–10.3)
Creatinine, Ser: 1.51 mg/dL — ABNORMAL HIGH (ref 0.44–1.00)
GFR calc Af Amer: 34 mL/min — ABNORMAL LOW (ref >60)
GFR calc non Af Amer: 29 mL/min — ABNORMAL LOW (ref >60)
Glucose, Bld: 120 mg/dL — ABNORMAL HIGH (ref 65–99)
POTASSIUM: 4.6 mmol/L (ref 3.5–5.1)
Sodium: 126 mmol/L — ABNORMAL LOW (ref 135–145)

## 2016-12-04 LAB — URINALYSIS, MICROSCOPIC (REFLEX)

## 2016-12-04 MED ORDER — BARIUM SULFATE 2.1 % PO SUSP
ORAL | Status: AC
  Filled 2016-12-04: qty 2

## 2016-12-04 NOTE — ED Triage Notes (Signed)
Pt presents from home with decreased urine output x 1 week and abd distention; pt reports hx of incontinence but concerned that now she is not urinating at all; pt also has hx of CKD; pt has been seen at urology office and given rx for myrbetriq and keflex

## 2016-12-04 NOTE — ED Notes (Signed)
Ice pack put on pt's hand for bruise that formed during blood draw.   Pt has tremors and needle came out during blood draw.   Triage nurse aware.

## 2016-12-04 NOTE — ED Provider Notes (Signed)
[facility] DEPT Provider Note   CSN: [id number] Arrival date & time: 12/04/16  2037     History   Chief Complaint Chief Complaint  Patient presents with  . Dysuria  . Abdominal Pain    HPI Madison Berg is a 83 y.o. female.  po     Madison Berg is a 83 y.o. female with history of anxiety, chronic kidney disease, CVA, diverticulitis, kidney stones, bladder disorder, presents to emergency department complaining of lower abdominal pain and urinary urgency. Patient reports approximately 2 weeks of lower abdominal pressure and urinary urgency and discomfort. She has seen her doctor and a urologist who initially started her on "incontinence medicine" but then a week later added Keflex for possible infection. Patient did not take all of Keflex, states it made her sick. She denies any fever or chills. She continues to have abdominal pain that is not improving. She denies any back pain. Denies any blood in her urine. She states she feels a lot of pressure feels like she may not be emptying her bladder completely. She also reports some constipation, states that she tried MiraLAX which did not help. Does not remember her last bowel movement.  Past Medical History:  Diagnosis Date  . Anxiety   . Aphasia as late effect of stroke 04/03/2014  . Cervical spondylosis   . Chronic kidney disease   . CVA (cerebral infarction) 04/2009   LMCA & posterior cerebral - excellent recovery, mild expressive aphasa  . Diverticulitis   . Gait disorder 04/03/2014  . History of nuclear stress test 06/2009   dipyridamole; negative, normal pattern of perfusion   . Hypertension   . Multiple lung nodules   . Nephrolithiasis   . OA (osteoarthritis)   . Osteopenia   . PAF (paroxysmal atrial fibrillation) ([place])   . Valvular heart disease   . Vitamin D deficiency     Patient Active Problem List   Diagnosis Date Noted  . Postural dizziness with presyncope 08/31/2016  . Paresthesia 05/12/2016  .  Sinusitis, chronic 01/21/2016  . Chest pain, moderate coronary artery risk 06/24/2015  . Chest pain 12/14/2014  . Pain in joint, shoulder region 12/14/2014  . Acute-on-chronic kidney injury ([place]) 12/14/2014  . Head ache 04/03/2014  . Gait disorder 04/03/2014  . Aphasia as late effect of stroke 04/03/2014  . Acute on chronic intracranial subdural hematoma ([facility]) 03/16/2014  . Subdural hematoma ([place]) 03/16/2014  . Low back pain 09/17/2013  . Aortic insufficiency 01/31/2013  . Stroke ([place]) 01/31/2013  . Leg length discrepancy 11/14/2012  . Acquired right flat foot 11/14/2012  . Multiple lung nodules on CT 01/14/2010  . Cough 08/13/2009  . Essential hypertension 03/28/2009  . Atrial fibrillation ([place]) 03/28/2009  . HEMORRHOIDS 03/28/2009  . DIVERTICULAR DISEASE 03/28/2009  . ARTHRITIS 03/28/2009  . MIGRAINES, HX OF 03/28/2009    Past Surgical History:  Procedure Laterality Date  . ABDOMINAL HYSTERECTOMY  1975  . APPENDECTOMY    . CATARACT EXTRACTION Bilateral 2013  . Instestinal Surgery  1987   r/t blockage  . NOSE SURGERY  1985   r/t deviated septum  . REPLACEMENT TOTAL KNEE Right 2004  . TRANSTHORACIC ECHOCARDIOGRAM  09/19/2012   EF 55-60% with normal systolic function; mod AR; MVP with mild-mod regurg; LA severely dilated; RV systolic pressure increased; mod dilated RA; RV systolic pressure increased - mild pulm HTN     OB History    No data available       Home Medications  Prior to Admission medications   Medication Sig Start Date End Date Taking? Authorizing Provider  alendronate (FOSAMAX) 70 MG tablet Take 70 mg by mouth once a week. Take with a full glass of water on an empty stomach.    [provider]  amiodarone (PACERONE) 200 MG tablet Take 200 mg by mouth daily.     [provider]  amLODipine (NORVASC) 10 MG tablet Take 10 mg by mouth at bedtime.     [provider]  aspirin EC 81 MG tablet Take 81 mg by mouth daily.     [provider]  B Complex-C (B-COMPLEX WITH VITAMIN C) tablet Take 1 tablet by mouth daily.    [provider]  cephALEXin (KEFLEX) 500 MG capsule  11/22/16   [provider]  Cholecalciferol 1000 units tablet Take 1,000 Units by mouth daily.    [provider]  furosemide (LASIX) 20 MG tablet Take 20 mg by mouth daily as needed. 09/15/16   [provider]  gabapentin (NEURONTIN) 100 MG capsule TAKE 1 CAPSULE AS DIRECTED AT BEDTIME 11/12/16   [provider]  levothyroxine (SYNTHROID, LEVOTHROID) 50 MCG tablet Take 50 mcg by mouth daily before breakfast.    [provider]  metoprolol succinate (TOPROL-XL) 50 MG 24 hr tablet Take 1 tablet (50 mg total) by mouth daily. 04/30/16   Hilty, Nadean Corwin, MD  Multiple Vitamins-Minerals (MULTIVITAMIN & MINERAL PO) Take 1 tablet by mouth daily.    [provider]  nitroGLYCERIN (NITROSTAT) 0.4 MG SL tablet Place 1 tablet (0.4 mg total) under the tongue every 5 (five) minutes as needed for chest pain. 07/02/15   Hilty, Nadean Corwin, MD  Omega-3 Fatty Acids (FISH OIL PO) Take 1 tablet by mouth at bedtime.    [provider]    Family History Family History  Problem Relation Age of Onset  . Lymphoma Mother   . Tuberculosis Father   . Tuberculosis Brother   . Aneurysm Other        grandmother, uncles, cousins  . Lung disease Neg Hx     Social History Social History  Substance Use Topics  . Smoking status: Passive Smoke Exposure - Never Smoker  . Smokeless tobacco: Never Used     Comment: Exposed at work, with her husband, & with her father.  . Alcohol use No     Allergies   Amitriptyline hcl; Atenolol; Azithromycin; Cefuroxime; Daypro [oxaprozin]; Doxycycline; Famvir [famciclovir]; Hyzaar [losartan potassium-hctz]; Iodine; Metoprolol tartrate; Relafen [nabumetone]; and Sterapred [prednisone]   Review of Systems Review of Systems  Constitutional: Negative for chills and  fever.  Respiratory: Negative for cough, chest tightness and shortness of breath.   Cardiovascular: Negative for chest pain, palpitations and leg swelling.  Gastrointestinal: Positive for abdominal pain. Negative for diarrhea, nausea and vomiting.  Genitourinary: Positive for difficulty urinating, dysuria and urgency. Negative for flank pain, pelvic pain, vaginal bleeding, vaginal discharge and vaginal pain.  Musculoskeletal: Negative for arthralgias, myalgias, neck pain and neck stiffness.  Skin: Negative for rash.  Neurological: Negative for dizziness, weakness and headaches.  All other systems reviewed and are negative.    Physical Exam Updated Vital Signs BP (!) 170/55 (BP Location: Right Arm)   Pulse (!) 52   Temp (!) 97.3 F (36.3 C) (Oral)   Resp 16   SpO2 90%   Physical Exam  Constitutional: She appears well-developed and well-nourished. No distress.  HENT:  Head: Normocephalic.  Eyes: Conjunctivae are normal.  Neck:  Neck supple.  Cardiovascular: Normal rate, regular rhythm and normal heart sounds.   Pulmonary/Chest: Effort normal and breath sounds normal. No respiratory distress. She has no wheezes. She has no rales.  Abdominal: Soft. Bowel sounds are normal. She exhibits no distension. There is tenderness. There is no rebound.  LLQ and suprapubic tenderness  Musculoskeletal: She exhibits no edema.  Neurological: She is alert.  Skin: Skin is warm and dry.  Psychiatric: She has a normal mood and affect. Her behavior is normal.  Nursing note and vitals reviewed.    ED Treatments / Results  Labs (all labs ordered are listed, but only abnormal results are displayed) Labs Reviewed  URINALYSIS, ROUTINE W REFLEX MICROSCOPIC - Abnormal; Notable for the following:       Result Value   Protein, ur 30 (*)    All other components within normal limits  BASIC METABOLIC PANEL - Abnormal; Notable for the following:    Sodium 126 (*)    Chloride 98 (*)    CO2 19 (*)     Glucose, Bld 120 (*)    BUN 42 (*)    Creatinine, Ser 1.51 (*)    GFR calc non Af Amer 29 (*)    GFR calc Af Amer 34 (*)    All other components within normal limits  CBC - Abnormal; Notable for the following:    RBC 3.74 (*)    Hemoglobin 11.9 (*)    HCT 34.6 (*)    RDW 15.7 (*)    All other components within normal limits  URINALYSIS, MICROSCOPIC (REFLEX) - Abnormal; Notable for the following:    Bacteria, UA RARE (*)    Squamous Epithelial / LPF 0-5 (*)    All other components within normal limits  URINE CULTURE  HEPATIC FUNCTION PANEL  LIPASE, BLOOD  TSH  I-STAT TROPONIN, ED  I-STAT CG4 LACTIC ACID, ED    EKG  EKG Interpretation None       Radiology Ct Abdomen Pelvis Wo Contrast  Result Date: 12/05/2016 CLINICAL DATA:  83 year old female with left lower quadrant abdominal pain. EXAM: CT ABDOMEN AND PELVIS WITHOUT CONTRAST TECHNIQUE: Multidetector CT imaging of the abdomen and pelvis was performed following the standard protocol without IV contrast. COMPARISON:  Abdominal ultrasound dated 11/30/2013 FINDINGS: Evaluation of this exam is limited in the absence of intravenous contrast. Lower chest: Partially visualized moderate bilateral pleural effusions with associated partial compressive atelectasis of the lower lobes. Pneumonia is not excluded. Clinical correlation is recommended. There is mild cardiomegaly. Partial calcification of the mitral annulus. There is hypoattenuation of the cardiac blood pool suggestive of a degree of anemia. Clinical correlation is recommended. No intra-abdominal free air or free fluid. Hepatobiliary: There is diffuse liver increased liver attenuation which may be related to underlying hemochromatosis, Wilson's disease, or amiodarone use. Clinical correlation is recommended. A 1.5 cm right hepatic hypodense lesion demonstrates fluid attenuation most likely a cyst. The gallbladder is grossly unremarkable with high attenuating content within the  gallbladder may represent sludge or hyper concentrated bile. No pericholecystic fluid. Pancreas: Unremarkable. No pancreatic ductal dilatation or surrounding inflammatory changes. Spleen: Normal in size without focal abnormality. Adrenals/Urinary Tract: The adrenal glands are unremarkable. There are punctate nonobstructing bilateral renal calculi. No hydronephrosis. The visualized ureters and urinary bladder appear unremarkable. Stomach/Bowel: There is sigmoid diverticulosis with muscular hypertrophy. Mild haziness of the wall of the sigmoid colon, likely chronic. Mild acute diverticulitis is not entirely excluded. Clinical correlation is recommended. No bowel obstruction. Appendectomy. Vascular/Lymphatic:  There is advanced aortoiliac atherosclerotic disease. The aorta is tortuous. No portal venous gas. There is no adenopathy. Reproductive: Hysterectomy.  No pelvic mass. Other: There is a 2 cm round fluid attenuating structure in the subcutaneous soft tissues of the left groin likely representing fluid within the inguinal canal or cyst. An enlarged lymph node is less likely but not excluded. This can be further evaluated by ultrasound. Musculoskeletal: There is fatty atrophy of the gluteal and paraspinal musculature consistent with sarcopenia. There is osteopenia and osteoarthritic changes of the hips, left greater right. There is scoliosis with multilevel degenerative changes of the spine and disc desiccation with vacuum phenomena. Old right posterior rib fractures. No acute fracture. IMPRESSION: 1. Moderate bilateral pleural effusions with partial compressive atelectasis of the lower lobes. Pneumonia is not excluded. Clinical correlation is recommended. 2. Mild cardiomegaly with evidence of anemia. 3. Hyperdense liver may be related to underlying deposition disease or medication related. 4. Sigmoid diverticulosis with muscular hypertrophy. And early or mild diverticulitis is less likely but not excluded. Clinical  correlation is recommended. No bowel obstruction. 5. Punctate nonobstructing bilateral renal calculi. No hydronephrosis. 6.  Aortic Atherosclerosis (ICD10-I70.0). Electronically Signed   By: Anner Crete M.D.   On: 12/05/2016 01:03    Procedures Procedures (including critical care time)  Medications Ordered in ED Medications  Barium Sulfate 2.1 % SUSP (not administered)     Initial Impression / Assessment and Plan / ED Course  I have reviewed the triage vital signs and the nursing notes.  Pertinent labs & imaging results that were available during my care of the patient were reviewed by me and considered in my medical decision making (see chart for details).     Patient in ED with continued pain in the lower abdomen. On exam tenderness left lower quadrant and suprapubic area. Will perform bladder scan. In and out urinalysis. Labs. Will get CT abdomen and pelvis further evaluation. Patient does have history of diverticulitis, also constipation or bladder abnormality, as well as kidney stones.  Urine today with no obvious signs of infection, culture sent. Note is that patient's heart rate dropped into the 30s. She denies any dizziness or lightheadedness at this time. She did mention that sometimes at home she would get very lightheaded upon standing and feels like she cannot "just go." Son admits that patient has been very weak over the last few weeks. She denies any chest pain. I added troponin. Will also add LFTs and lipase. EKG showing sinus bradycardia. I discussed patient with cardiology, who advised to stop both amiodarone and metoprolol for now and see if heart rate improves. He also advised to have patient be seen by cardiology prior to discharge from the hospital to see if amiodarone can be restarted and maybe at a lower dose at that time. Patient's CT scan is still pending.   Discussed with triad, will admit. Asked to add on LFTs and TSH. Already pending.  Vitals:   12/05/16  0025 12/05/16 0046 12/05/16 0100 12/05/16 0105  BP:  (!) 146/79  (!) 148/49  Pulse: (!) 36 (!) 36 (!) 35 (!) 37  Resp: 17  19   Temp:      TempSrc:      SpO2: 90% 93% 90% 93%       Final Clinical Impressions(s) / ED Diagnoses   Final diagnoses:  Bradycardia  Left lower quadrant pain  Weakness    New Prescriptions New Prescriptions   No medications on file  Jeannett Senior, PA-C 12/05/16 1537    Orpah Greek, MD 12/05/16 2303

## 2016-12-04 NOTE — ED Notes (Signed)
Pt assisted to bedside commode to urinate. Pt unsteady on her feet and needed moderate assist. Pt cleaning done and placed back in bed.

## 2016-12-04 NOTE — ED Triage Notes (Signed)
Son also reports she had a urinalysis in June which showed no infection; also keflex makes patient very sick and does not take it any more

## 2016-12-04 NOTE — ED Notes (Signed)
Pt to drink 1 bottle of contrast at 2230 and 1 bottle at 2330

## 2016-12-05 ENCOUNTER — Encounter (HOSPITAL_COMMUNITY): Payer: Self-pay | Admitting: Internal Medicine

## 2016-12-05 ENCOUNTER — Emergency Department (HOSPITAL_COMMUNITY): Payer: Medicare Other

## 2016-12-05 DIAGNOSIS — R1032 Left lower quadrant pain: Secondary | ICD-10-CM

## 2016-12-05 DIAGNOSIS — R001 Bradycardia, unspecified: Principal | ICD-10-CM

## 2016-12-05 DIAGNOSIS — N200 Calculus of kidney: Secondary | ICD-10-CM | POA: Diagnosis not present

## 2016-12-05 DIAGNOSIS — I480 Paroxysmal atrial fibrillation: Secondary | ICD-10-CM | POA: Diagnosis not present

## 2016-12-05 LAB — HEPATIC FUNCTION PANEL
ALK PHOS: 103 U/L (ref 38–126)
ALT: 68 U/L — AB (ref 14–54)
AST: 59 U/L — ABNORMAL HIGH (ref 15–41)
Albumin: 3.8 g/dL (ref 3.5–5.0)
BILIRUBIN DIRECT: 0.3 mg/dL (ref 0.1–0.5)
BILIRUBIN INDIRECT: 1 mg/dL — AB (ref 0.3–0.9)
Total Bilirubin: 1.3 mg/dL — ABNORMAL HIGH (ref 0.3–1.2)
Total Protein: 6.7 g/dL (ref 6.5–8.1)

## 2016-12-05 LAB — TSH: TSH: 4.29 u[IU]/mL (ref 0.350–4.500)

## 2016-12-05 LAB — I-STAT TROPONIN, ED: Troponin i, poc: 0.02 ng/mL (ref 0.00–0.08)

## 2016-12-05 LAB — BASIC METABOLIC PANEL
Anion gap: 8 (ref 5–15)
BUN: 38 mg/dL — AB (ref 6–20)
CHLORIDE: 97 mmol/L — AB (ref 101–111)
CO2: 20 mmol/L — AB (ref 22–32)
CREATININE: 1.41 mg/dL — AB (ref 0.44–1.00)
Calcium: 8.5 mg/dL — ABNORMAL LOW (ref 8.9–10.3)
GFR calc Af Amer: 37 mL/min — ABNORMAL LOW (ref >60)
GFR calc non Af Amer: 32 mL/min — ABNORMAL LOW (ref >60)
GLUCOSE: 102 mg/dL — AB (ref 65–99)
POTASSIUM: 4.3 mmol/L (ref 3.5–5.1)
Sodium: 125 mmol/L — ABNORMAL LOW (ref 135–145)

## 2016-12-05 LAB — CBC
HEMATOCRIT: 32.2 % — AB (ref 36.0–46.0)
Hemoglobin: 10.9 g/dL — ABNORMAL LOW (ref 12.0–15.0)
MCH: 31.5 pg (ref 26.0–34.0)
MCHC: 33.9 g/dL (ref 30.0–36.0)
MCV: 93.1 fL (ref 78.0–100.0)
Platelets: 217 10*3/uL (ref 150–400)
RBC: 3.46 MIL/uL — ABNORMAL LOW (ref 3.87–5.11)
RDW: 15.8 % — AB (ref 11.5–15.5)
WBC: 6.4 10*3/uL (ref 4.0–10.5)

## 2016-12-05 LAB — LIPASE, BLOOD: LIPASE: 50 U/L (ref 11–51)

## 2016-12-05 LAB — I-STAT CG4 LACTIC ACID, ED: Lactic Acid, Venous: 0.96 mmol/L (ref 0.5–1.9)

## 2016-12-05 MED ORDER — ASPIRIN EC 81 MG PO TBEC
81.0000 mg | DELAYED_RELEASE_TABLET | Freq: Every day | ORAL | Status: DC
Start: 2016-12-05 — End: 2016-12-06
  Administered 2016-12-05 – 2016-12-06 (×2): 81 mg via ORAL
  Filled 2016-12-05 (×2): qty 1

## 2016-12-05 MED ORDER — ACETAMINOPHEN 650 MG RE SUPP: 650.0000 mg | Freq: Four times a day (QID) | RECTAL | Status: DC | PRN

## 2016-12-05 MED ORDER — VITAMIN D 1000 UNITS PO TABS
1000.0000 [IU] | ORAL_TABLET | Freq: Every day | ORAL | Status: DC
  Administered 2016-12-05 – 2016-12-06 (×2): 1000 [IU] via ORAL
  Filled 2016-12-05 (×2): qty 1

## 2016-12-05 MED ORDER — ACETAMINOPHEN 325 MG PO TABS: 650.0000 mg | ORAL_TABLET | Freq: Four times a day (QID) | ORAL | Status: DC | PRN

## 2016-12-05 MED ORDER — NITROGLYCERIN 0.4 MG SL SUBL: 0.4000 mg | SUBLINGUAL_TABLET | SUBLINGUAL | Status: DC | PRN

## 2016-12-05 MED ORDER — LEVOTHYROXINE SODIUM 50 MCG PO TABS
50.0000 ug | ORAL_TABLET | Freq: Every day | ORAL | Status: DC
  Administered 2016-12-05 – 2016-12-06 (×2): 50 ug via ORAL
  Filled 2016-12-05 (×2): qty 1

## 2016-12-05 MED ORDER — AMLODIPINE BESYLATE 10 MG PO TABS
10.0000 mg | ORAL_TABLET | Freq: Every day | ORAL | Status: DC
  Administered 2016-12-05: 10 mg via ORAL
  Filled 2016-12-05: qty 1

## 2016-12-05 MED ORDER — FUROSEMIDE 10 MG/ML IJ SOLN
20.0000 mg | Freq: Once | INTRAMUSCULAR | Status: AC
  Administered 2016-12-05: 20 mg via INTRAVENOUS
  Filled 2016-12-05: qty 2

## 2016-12-05 MED ORDER — GABAPENTIN 100 MG PO CAPS
100.0000 mg | ORAL_CAPSULE | Freq: Every day | ORAL | Status: DC
  Administered 2016-12-05: 100 mg via ORAL
  Filled 2016-12-05: qty 1

## 2016-12-05 NOTE — Progress Notes (Signed)
The patient was seen and evaluated earlier this a.m. by my associate. Patient presents with asymptomatic bradycardia of which cardiology has recommended observing off amiodarone and beta blocker.  Patient seen and she currently has no new complaints.   Will reassess next aml  Gen: pt in nad, alert and awake CV: s1 and s2 wnl, no rubs Pulm:  No increased wob, no wheezes  Xavier Munger, Linward Foster

## 2016-12-05 NOTE — H&P (Signed)
History and Physical    Madison Berg LNL:[id number] DOB: [birth year] DOA: 12/04/2016  PCP: Deland Pretty, MD  Patient coming from: Home.  Chief Complaint: Abdominal discomfort.  HPI: Madison Berg is a 83 y.o. female with history of atrial fibrillation, history of stroke with expressive aphasia, chronic subdural hematoma, hypertension, chronic kidney disease stage III, chronic anemia who was brought to the ER to patient has been having abdominal discomfort with difficulty urination. Patient states that she is usually incontinent of urine but over the last 2-3 days at been finding it difficult to urinate and has been having abdominal discomfort. Denies any nausea vomiting or diarrhea. Denies any chest pain shortness of breath fever or chills.   ED Course: In the ER CT of the abdomen and pelvis done shows nothing acute except for possible early diverticulitis. Patient on exam abdomen appears benign. UA is unremarkable. Patient is afebrile. Patient remained in sinus bradycardic at around rate 30-40. Cardiologist on call Dr. Eula Fried was consulted and advised to stop patient's metoprolol and amiodarone and observe.  Review of Systems: As per HPI, rest all negative.   Past Medical History:  Diagnosis Date  . Anxiety   . Aphasia as late effect of stroke 04/03/2014  . Cervical spondylosis   . Chronic kidney disease   . CVA (cerebral infarction) 04/2009   LMCA & posterior cerebral - excellent recovery, mild expressive aphasa  . Diverticulitis   . Gait disorder 04/03/2014  . History of nuclear stress test 06/2009   dipyridamole; negative, normal pattern of perfusion   . Hypertension   . Multiple lung nodules   . Nephrolithiasis   . OA (osteoarthritis)   . Osteopenia   . PAF (paroxysmal atrial fibrillation) ([place])   . Valvular heart disease   . Vitamin D deficiency     Past Surgical History:  Procedure Laterality Date  . ABDOMINAL HYSTERECTOMY  1975  . APPENDECTOMY    . CATARACT  EXTRACTION Bilateral 2013  . Instestinal Surgery  1987   r/t blockage  . NOSE SURGERY  1985   r/t deviated septum  . REPLACEMENT TOTAL KNEE Right 2004  . TRANSTHORACIC ECHOCARDIOGRAM  09/19/2012   EF 55-60% with normal systolic function; mod AR; MVP with mild-mod regurg; LA severely dilated; RV systolic pressure increased; mod dilated RA; RV systolic pressure increased - mild pulm HTN      reports that she is a non-smoker but has been exposed to tobacco smoke. She has never used smokeless tobacco. She reports that she does not drink alcohol or use drugs.  Allergies  Allergen Reactions  . Amitriptyline Hcl Itching  . Atenolol Other (See Comments)    Hair loss, thin nails, itching  . Azithromycin Other (See Comments)    Mouth sores; sore throat  . Cefuroxime Other (See Comments)    unknown  . Cephalexin Nausea And Vomiting  . Daypro [Oxaprozin] Other (See Comments)    dyspepsia  . Doxycycline Other (See Comments)    Mouth sores; sore throat  . Famvir [Famciclovir] Other (See Comments)    unknown  . Hyzaar [Losartan Potassium-Hctz] Other (See Comments)    Headache; tight face  . Iodine Other (See Comments)    Involuntary muscle contractions  . Metoprolol Tartrate Itching  . Relafen [Nabumetone] Other (See Comments)    Mouth sores  . Sterapred [Prednisone] Other (See Comments)    weakness    Family History  Problem Relation Age of Onset  . Lymphoma Mother   .  Tuberculosis Father   . Tuberculosis Brother   . Aneurysm Other        grandmother, uncles, cousins  . Lung disease Neg Hx     Prior to Admission medications   Medication Sig Start Date End Date Taking? Authorizing Provider  amiodarone (PACERONE) 200 MG tablet Take 200 mg by mouth daily.    Yes [provider]  amLODipine (NORVASC) 10 MG tablet Take 10 mg by mouth at bedtime.    Yes [provider]  aspirin EC 81 MG tablet Take 81 mg by mouth daily.   Yes [provider]  B Complex-C  (B-COMPLEX WITH VITAMIN C) tablet Take 1 tablet by mouth daily.   Yes [provider]  Cholecalciferol 1000 units tablet Take 1,000 Units by mouth daily.   Yes [provider]  furosemide (LASIX) 20 MG tablet Take 20 mg by mouth daily as needed for fluid or edema.  09/15/16  Yes [provider]  gabapentin (NEURONTIN) 100 MG capsule TAKE 1 CAPSULE AS DIRECTED AT BEDTIME 11/12/16  Yes [provider]  levothyroxine (SYNTHROID, LEVOTHROID) 50 MCG tablet Take 50 mcg by mouth daily before breakfast.   Yes [provider]  metoprolol succinate (TOPROL-XL) 50 MG 24 hr tablet Take 1 tablet (50 mg total) by mouth daily. Patient taking differently: Take 25 mg by mouth daily.  04/30/16  Yes Hilty, Nadean Corwin, MD  Multiple Vitamins-Minerals (MULTIVITAMIN & MINERAL PO) Take 1 tablet by mouth daily.   Yes [provider]  nitroGLYCERIN (NITROSTAT) 0.4 MG SL tablet Place 1 tablet (0.4 mg total) under the tongue every 5 (five) minutes as needed for chest pain. 07/02/15  Yes Hilty, Nadean Corwin, MD  Omega-3 Fatty Acids (FISH OIL PO) Take 1 tablet by mouth at bedtime.   Yes [provider]    Physical Exam: Vitals:   12/05/16 0100 12/05/16 0105 12/05/16 0130 12/05/16 0301  BP:  (!) 148/49 (!) 156/47 (!) 158/46  Pulse: (!) 35 (!) 37 (!) 40 (!) 39  Resp: 19  19 18   Temp:    (!) 97.5 F (36.4 C)  TempSrc:    Oral  SpO2: 90% 93% 91% 92%  Weight:    55.2 kg (121 lb 11.2 oz)  Height:    5' (1.524 m)      Constitutional: Moderately built and nourished. Vitals:   12/05/16 0100 12/05/16 0105 12/05/16 0130 12/05/16 0301  BP:  (!) 148/49 (!) 156/47 (!) 158/46  Pulse: (!) 35 (!) 37 (!) 40 (!) 39  Resp: 19  19 18   Temp:    (!) 97.5 F (36.4 C)  TempSrc:    Oral  SpO2: 90% 93% 91% 92%  Weight:    55.2 kg (121 lb 11.2 oz)  Height:    5' (1.524 m)   Eyes: Anicteric no pallor. ENMT: No discharge from the ears eyes nose or mouth. Neck: No JVD appreciated  no mass felt. Respiratory: No rhonchi or crepitations. Cardiovascular: S1 and S2 heard no murmurs appreciated. Abdomen: Soft nontender bowel sounds present. No guarding or rigidity. Musculoskeletal: No edema. No joint effusion. Skin: No rash or skin appears warm. Neurologic: Alert awake oriented to time place and person. Has expressive aphasia. Moves all extremities. Psychiatric: Appears normal. Normal affect.   Labs on Admission: I have personally reviewed following labs and imaging studies  CBC:  Recent Labs Lab 12/04/16 2105  WBC 8.3  HGB 11.9*  HCT 34.6*  MCV 92.5  PLT 266  Basic Metabolic Panel:  Recent Labs Lab 12/04/16 2105  NA 126*  K 4.6  CL 98*  CO2 19*  GLUCOSE 120*  BUN 42*  CREATININE 1.51*  CALCIUM 9.2   GFR: Estimated Creatinine Clearance: 19.3 mL/min (A) (by C-G formula based on SCr of 1.51 mg/dL (H)). Liver Function Tests:  Recent Labs Lab 12/05/16 0119  AST 59*  ALT 68*  ALKPHOS 103  BILITOT 1.3*  PROT 6.7  ALBUMIN 3.8    Recent Labs Lab 12/05/16 0119  LIPASE 50   No results for input(s): AMMONIA in the last 168 hours. Coagulation Profile: No results for input(s): INR, PROTIME in the last 168 hours. Cardiac Enzymes: No results for input(s): CKTOTAL, CKMB, CKMBINDEX, TROPONINI in the last 168 hours. BNP (last 3 results) No results for input(s): PROBNP in the last 8760 hours. HbA1C: No results for input(s): HGBA1C in the last 72 hours. CBG: No results for input(s): GLUCAP in the last 168 hours. Lipid Profile: No results for input(s): CHOL, HDL, LDLCALC, TRIG, CHOLHDL, LDLDIRECT in the last 72 hours. Thyroid Function Tests:  Recent Labs  12/05/16 0132  TSH 4.290   Anemia Panel: No results for input(s): VITAMINB12, FOLATE, FERRITIN, TIBC, IRON, RETICCTPCT in the last 72 hours. Urine analysis:    Component Value Date/Time   COLORURINE YELLOW 12/04/2016 2244   APPEARANCEUR CLEAR 12/04/2016 2244   LABSPEC 1.020 12/04/2016  2244   PHURINE 5.5 12/04/2016 2244   GLUCOSEU NEGATIVE 12/04/2016 2244   HGBUR NEGATIVE 12/04/2016 2244   BILIRUBINUR NEGATIVE 12/04/2016 2244   KETONESUR NEGATIVE 12/04/2016 2244   PROTEINUR 30 (A) 12/04/2016 2244   UROBILINOGEN 0.2 12/15/2014 0340   NITRITE NEGATIVE 12/04/2016 2244   LEUKOCYTESUR NEGATIVE 12/04/2016 2244   Sepsis Labs: @LABRCNTIP (procalcitonin:4,lacticidven:4) )No results found for this or any previous visit (from the past 240 hour(s)).   Radiological Exams on Admission: Ct Abdomen Pelvis Wo Contrast  Result Date: 12/05/2016 CLINICAL DATA:  83 year old female with left lower quadrant abdominal pain. EXAM: CT ABDOMEN AND PELVIS WITHOUT CONTRAST TECHNIQUE: Multidetector CT imaging of the abdomen and pelvis was performed following the standard protocol without IV contrast. COMPARISON:  Abdominal ultrasound dated 11/30/2013 FINDINGS: Evaluation of this exam is limited in the absence of intravenous contrast. Lower chest: Partially visualized moderate bilateral pleural effusions with associated partial compressive atelectasis of the lower lobes. Pneumonia is not excluded. Clinical correlation is recommended. There is mild cardiomegaly. Partial calcification of the mitral annulus. There is hypoattenuation of the cardiac blood pool suggestive of a degree of anemia. Clinical correlation is recommended. No intra-abdominal free air or free fluid. Hepatobiliary: There is diffuse liver increased liver attenuation which may be related to underlying hemochromatosis, Wilson's disease, or amiodarone use. Clinical correlation is recommended. A 1.5 cm right hepatic hypodense lesion demonstrates fluid attenuation most likely a cyst. The gallbladder is grossly unremarkable with high attenuating content within the gallbladder may represent sludge or hyper concentrated bile. No pericholecystic fluid. Pancreas: Unremarkable. No pancreatic ductal dilatation or surrounding inflammatory changes. Spleen:  Normal in size without focal abnormality. Adrenals/Urinary Tract: The adrenal glands are unremarkable. There are punctate nonobstructing bilateral renal calculi. No hydronephrosis. The visualized ureters and urinary bladder appear unremarkable. Stomach/Bowel: There is sigmoid diverticulosis with muscular hypertrophy. Mild haziness of the wall of the sigmoid colon, likely chronic. Mild acute diverticulitis is not entirely excluded. Clinical correlation is recommended. No bowel obstruction. Appendectomy. Vascular/Lymphatic: There is advanced aortoiliac atherosclerotic disease. The aorta is tortuous. No portal venous gas. There is no adenopathy.  Reproductive: Hysterectomy.  No pelvic mass. Other: There is a 2 cm round fluid attenuating structure in the subcutaneous soft tissues of the left groin likely representing fluid within the inguinal canal or cyst. An enlarged lymph node is less likely but not excluded. This can be further evaluated by ultrasound. Musculoskeletal: There is fatty atrophy of the gluteal and paraspinal musculature consistent with sarcopenia. There is osteopenia and osteoarthritic changes of the hips, left greater right. There is scoliosis with multilevel degenerative changes of the spine and disc desiccation with vacuum phenomena. Old right posterior rib fractures. No acute fracture. IMPRESSION: 1. Moderate bilateral pleural effusions with partial compressive atelectasis of the lower lobes. Pneumonia is not excluded. Clinical correlation is recommended. 2. Mild cardiomegaly with evidence of anemia. 3. Hyperdense liver may be related to underlying deposition disease or medication related. 4. Sigmoid diverticulosis with muscular hypertrophy. And early or mild diverticulitis is less likely but not excluded. Clinical correlation is recommended. No bowel obstruction. 5. Punctate nonobstructing bilateral renal calculi. No hydronephrosis. 6.  Aortic Atherosclerosis (ICD10-I70.0). Electronically Signed    By: Anner Crete M.D.   On: 12/05/2016 01:03    EKG: Independently reviewed. Sinus bradycardia.  Assessment/Plan Principal Problem:   Bradycardia Active Problems:   Essential hypertension   Atrial fibrillation (HCC)   Subdural hematoma (HCC)   Left lower quadrant pain    1. Sinus bradycardia with history of A. Fib - discussed with Dr. Otelia Sergeant, on-call cardiologist who advised to hold off patient's amiodarone and metoprolol which may take time to excrete due to patient's renal failure. To observe. Check TSH. 2. Abdominal discomfort - CT scan does not show a definite acute features. Abdomen on my exam appears benign. UA is unremarkable. Abdominal discomfort may be related to patient's urinary retention off and on. Closely observe. Check lactate levels. 3. History of atrial fibrillation - not on anticoagulation secondary to history of chronic subdural hematoma. Patient on aspirin. Holding off beta blockers and amiodarone due to #1 sinus bradycardia. 4. History of stroke with expressive aphasia on aspirin. 5. Chronic kidney disease stage III - creatinine appears to be at baseline. 6. Chronic anemia likely from renal disease - mild worsening of hemoglobin from baseline. Follow CBC. 7. Hypothyroidism on Synthroid. Check TSH. 8. Hypertension continue amlodipine. 9. Mildly elevated LFTs - CT scan describes liver as possible medication related. Patient on amiodarone. May discuss with cardiologist name. 10. Bilateral pleural effusion and the CAT scan - will give 1 dose of Lasix and based on response further doses. 11. Hyponatremia probably from fluid overload - repeat metabolic panel after Lasix dose.  I have discussed with on-call cardiologist. I have reviewed patient's old charts and labs.   DVT prophylaxis: SCDs. Patient is on aspirin. Not on anticoagulation with history of chronic subdural hematoma. Code Status: Full code. Please verify this with patient's son in the morning.  Family  Communication: Discussed with patient.  Disposition Plan: Home.  Consults called: Discussed with cardiologist.  Admission status: Observation.    Rise Patience MD [facility] Pager [phone].  If 7PM-7AM, please contact night-coverage [email or web address] Password [email or web address]  12/05/2016, 3:54 AM

## 2016-12-05 NOTE — Progress Notes (Signed)
1000cc urine output recoreded via in/out catheter, pt felt comfortable and relieved, will continue to monitor

## 2016-12-05 NOTE — ED Provider Notes (Signed)
Patient presented to the ER with decreased urine output, abdominal pain and distention.  Face to face Exam: HEENT - PERRLA Lungs - CTAB Heart - RRR, no M/R/G Abd - S/ND Neuro - alert, oriented x3  Plan: Patient with complaints of abdominal pain. Workup has been unremarkable. CT abdomen does not show any acute abnormality. She reports decreased urine output, however, creatinine has actually improved since last value. She does not have evidence of urinary tract infection. Patient was, however, noted to have profound bradycardia. She reports that over the last month she has had declining energy and now has been feeling like she is going to pass out with any exertion. Bradycardia likely secondary to combination of Pacerone and Lopressor. Patient will be admitted to the hospital, hold both medications as per cardiology.   Orpah Greek, MD 12/05/16 [phone]

## 2016-12-05 NOTE — Progress Notes (Signed)
Pt's bladder scan shows she is retaining more than 750 cc urine, paged MD regarding order of in/out catheter, waiting for the response

## 2016-12-05 NOTE — Progress Notes (Signed)
Pt has been running [place] whole day but asymptomatic, alert and oriented times 4, no complain of pain, family member is in bed side and is updating, will continue to monitor the paitent

## 2016-12-05 NOTE — Progress Notes (Signed)
Md aware regarding pt's Na level currently 125, will be rechecked tomorrow per MD

## 2016-12-06 ENCOUNTER — Encounter (HOSPITAL_COMMUNITY): Payer: Self-pay | Admitting: Student

## 2016-12-06 DIAGNOSIS — I10 Essential (primary) hypertension: Secondary | ICD-10-CM | POA: Diagnosis not present

## 2016-12-06 DIAGNOSIS — I480 Paroxysmal atrial fibrillation: Secondary | ICD-10-CM | POA: Diagnosis not present

## 2016-12-06 DIAGNOSIS — R001 Bradycardia, unspecified: Secondary | ICD-10-CM | POA: Diagnosis not present

## 2016-12-06 LAB — URINE CULTURE: Culture: NO GROWTH

## 2016-12-06 MED ORDER — AMIODARONE HCL 100 MG PO TABS: 100.0000 mg | ORAL_TABLET | Freq: Every day | ORAL | Status: DC

## 2016-12-06 MED ORDER — AMIODARONE HCL 100 MG PO TABS: 100.0000 mg | ORAL_TABLET | Freq: Every day | ORAL | 0 refills | Status: DC

## 2016-12-06 NOTE — Care Management Obs Status (Signed)
[place] NOTIFICATION   Patient Details  Name: Madison Berg MRN: [id number] Date of Birth: [birth year]   Medicare Observation Status Notification Given:  Yes    Zenon Mayo, RN 12/06/2016, 4:17 PM

## 2016-12-06 NOTE — Progress Notes (Signed)
Amiodarone decreased per cardiolgy and to start 12/08/16.  Son at bedside and aware of updated plan of care.

## 2016-12-06 NOTE — Discharge Summary (Signed)
Physician Discharge Summary  Madison Berg LSL:[id number] DOB: 1923-06-23 DOA: 12/04/2016  PCP: Deland Pretty, MD  Admit date: 12/04/2016 Discharge date: 12/06/2016  Time spent: > 35 minutes  Recommendations for Outpatient Follow-up:  1. Ensure patient follows up with cardiologist and neurologist 2. Beta blocker discontinued 3. Amiodarone dose decreased to 100 mg by mouth daily   Discharge Diagnoses:  Principal Problem:   Bradycardia Active Problems:   Essential hypertension   Atrial fibrillation (HCC)   Subdural hematoma (HCC)   Left lower quadrant pain   Discharge Condition: Stable  Diet recommendation: Heart healthy  Filed Weights   12/05/16 0301  Weight: 55.2 kg (121 lb 11.2 oz)    History of present illness:  83 y.o. female with history of atrial fibrillation, history of stroke with expressive aphasia, chronic subdural hematoma, hypertension, chronic kidney disease stage III, chronic anemia who was brought to the ER to patient has been having abdominal discomfort with difficulty urination.During further evaluation was found to have bradycardia with heart rates around 30-40. Patient was taken into the hospital for further evaluation recommendations regarding bradycardia  Hospital Course:  Bradycardia -Cardiology consulted who recommended the following: Sinus Bradycardia - HR initially in the 30's upon arrival to the ED with Amiodarone and Lopressor being held at that time. In reviewing telemetry, she has remained in sinus bradycardia with HR in the mid-40's to 50's. No evidence of CHB or significant pauses. Thankfully, she is asymptomatic with this during admission. Does report general fatigue at home. - she was suppose to reduce her Amiodarone dosing from 200mg  daily to 100mg  daily at her last office visit but continued on 200mg  daily due to this being wrote on the Rx bottle. Discussed with Dr. Sallyanne Kuster. Recommend restarting Amiodarone at 100mg  daily and discontinuing  Lopressor at the time of discharge. Close outpatient Cardiology follow-up with be arranged.   2. Paroxysmal Atrial Fibrillation - maintaining sinus rhythm this admission. Reduce Amiodarone dosing from 200mg  daily to 100mg  daily. Discontinue Lopressor. - This patients CHA2DS2-VASc Score and unadjusted Ischemic Stroke Rate (% per year) is equal to 9.7 % stroke rate/year from a score of 6 (HTN, Female, Age (2), CVA (2)). She is not on anticoagulation due to history of chronic subdural hematomas  3. Moderate TR - noted on echo in 12/2014.  4. Abdominal Pain - labs without acute abnormalities. Abdominal CT showing moderate bilateral pleural effusions with sigmoid diverticulosis and nonobstructive bilateral renal calculi.  - reports her symptoms have since resolved.  - per admitting team.  Abdominal discomfort -CT scan of abdomen and pelvis negative for source. Patient does not have a white blood cell count or fevers. - May be secondary to intermittent urinary retention. Patient is producing urine. Recommended following up with urologist as outpatient. Serum creatinine improved when compared to last  For other known medical conditions we'll continue prior to admission medication regimen  Procedures:  none  Consultations:  Cardiology  Discharge Exam: Vitals:   12/06/16 0416 12/06/16 1310  BP: (!) 154/45 (!) 162/46  Pulse: (!) 50 62  Resp: 18 18  Temp: (!) 97.5 F (36.4 C) 98 F (36.7 C)    General: Pt in nad, alert and awake Cardiovascular: s1 and s2 present. No rubs Respiratory: no increased wob, no wheezes  Discharge Instructions   Discharge Instructions    Call MD for:  redness, tenderness, or signs of infection (pain, swelling, redness, odor or green/yellow discharge around incision site)    Complete by:  As directed  Call MD for:  temperature >100.4    Complete by:  As directed    Diet - low sodium heart healthy    Complete by:  As directed    Discharge  instructions    Complete by:  As directed    Please ensure follow up with your Urologist for further evaluation and recommendations regarding you urinary retention.   Also follow up with your cardiologist for further evaluation and recommendations.   Increase activity slowly    Complete by:  As directed      Current Discharge Medication List    CONTINUE these medications which have CHANGED   Details  amiodarone (PACERONE) 100 MG tablet Take 1 tablet (100 mg total) by mouth daily. Qty: 30 tablet, Refills: 0      CONTINUE these medications which have NOT CHANGED   Details  amLODipine (NORVASC) 10 MG tablet Take 10 mg by mouth at bedtime.     aspirin EC 81 MG tablet Take 81 mg by mouth daily.    B Complex-C (B-COMPLEX WITH VITAMIN C) tablet Take 1 tablet by mouth daily.    Cholecalciferol 1000 units tablet Take 1,000 Units by mouth daily.    gabapentin (NEURONTIN) 100 MG capsule TAKE 1 CAPSULE AS DIRECTED AT BEDTIME Refills: 5    levothyroxine (SYNTHROID, LEVOTHROID) 50 MCG tablet Take 50 mcg by mouth daily before breakfast.    Multiple Vitamins-Minerals (MULTIVITAMIN & MINERAL PO) Take 1 tablet by mouth daily.    nitroGLYCERIN (NITROSTAT) 0.4 MG SL tablet Place 1 tablet (0.4 mg total) under the tongue every 5 (five) minutes as needed for chest pain. Qty: 25 tablet, Refills: 3    Omega-3 Fatty Acids (FISH OIL PO) Take 1 tablet by mouth at bedtime.      STOP taking these medications     furosemide (LASIX) 20 MG tablet      metoprolol succinate (TOPROL-XL) 50 MG 24 hr tablet        Allergies  Allergen Reactions  . Amitriptyline Hcl Itching  . Atenolol Other (See Comments)    Hair loss, thin nails, itching  . Azithromycin Other (See Comments)    Mouth sores; sore throat  . Cefuroxime Other (See Comments)    unknown  . Cephalexin Nausea And Vomiting  . Daypro [Oxaprozin] Other (See Comments)    dyspepsia  . Doxycycline Other (See Comments)    Mouth sores; sore  throat  . Famvir [Famciclovir] Other (See Comments)    unknown  . Hyzaar [Losartan Potassium-Hctz] Other (See Comments)    Headache; tight face  . Iodine Other (See Comments)    Involuntary muscle contractions  . Metoprolol Tartrate Itching  . Relafen [Nabumetone] Other (See Comments)    Mouth sores  . Sterapred [Prednisone] Other (See Comments)    weakness   Follow-up Information    [facility], [place] Follow up on 12/14/2016.   Specialties:  Cardiology, Radiology Why:  Cardiology Hospital Follow-Up on 12/14/2016 at 2:00 PM (Appointment is with Dr. Lysbeth Penner PA).  Contact information: [address] [place] [address] [phone]            The results of significant diagnostics from this hospitalization (including imaging, microbiology, ancillary and laboratory) are listed below for reference.    Significant Diagnostic Studies: Ct Abdomen Pelvis Wo Contrast  Result Date: 12/05/2016 CLINICAL DATA:  84 year old female with left lower quadrant abdominal pain. EXAM: CT ABDOMEN AND PELVIS WITHOUT CONTRAST TECHNIQUE: Multidetector CT imaging of the abdomen and pelvis  was performed following the standard protocol without IV contrast. COMPARISON:  Abdominal ultrasound dated 11/30/2013 FINDINGS: Evaluation of this exam is limited in the absence of intravenous contrast. Lower chest: Partially visualized moderate bilateral pleural effusions with associated partial compressive atelectasis of the lower lobes. Pneumonia is not excluded. Clinical correlation is recommended. There is mild cardiomegaly. Partial calcification of the mitral annulus. There is hypoattenuation of the cardiac blood pool suggestive of a degree of anemia. Clinical correlation is recommended. No intra-abdominal free air or free fluid. Hepatobiliary: There is diffuse liver increased liver attenuation which may be related to underlying hemochromatosis, Wilson's disease, or amiodarone use. Clinical correlation is  recommended. A 1.5 cm right hepatic hypodense lesion demonstrates fluid attenuation most likely a cyst. The gallbladder is grossly unremarkable with high attenuating content within the gallbladder may represent sludge or hyper concentrated bile. No pericholecystic fluid. Pancreas: Unremarkable. No pancreatic ductal dilatation or surrounding inflammatory changes. Spleen: Normal in size without focal abnormality. Adrenals/Urinary Tract: The adrenal glands are unremarkable. There are punctate nonobstructing bilateral renal calculi. No hydronephrosis. The visualized ureters and urinary bladder appear unremarkable. Stomach/Bowel: There is sigmoid diverticulosis with muscular hypertrophy. Mild haziness of the wall of the sigmoid colon, likely chronic. Mild acute diverticulitis is not entirely excluded. Clinical correlation is recommended. No bowel obstruction. Appendectomy. Vascular/Lymphatic: There is advanced aortoiliac atherosclerotic disease. The aorta is tortuous. No portal venous gas. There is no adenopathy. Reproductive: Hysterectomy.  No pelvic mass. Other: There is a 2 cm round fluid attenuating structure in the subcutaneous soft tissues of the left groin likely representing fluid within the inguinal canal or cyst. An enlarged lymph node is less likely but not excluded. This can be further evaluated by ultrasound. Musculoskeletal: There is fatty atrophy of the gluteal and paraspinal musculature consistent with sarcopenia. There is osteopenia and osteoarthritic changes of the hips, left greater right. There is scoliosis with multilevel degenerative changes of the spine and disc desiccation with vacuum phenomena. Old right posterior rib fractures. No acute fracture. IMPRESSION: 1. Moderate bilateral pleural effusions with partial compressive atelectasis of the lower lobes. Pneumonia is not excluded. Clinical correlation is recommended. 2. Mild cardiomegaly with evidence of anemia. 3. Hyperdense liver may be related  to underlying deposition disease or medication related. 4. Sigmoid diverticulosis with muscular hypertrophy. And early or mild diverticulitis is less likely but not excluded. Clinical correlation is recommended. No bowel obstruction. 5. Punctate nonobstructing bilateral renal calculi. No hydronephrosis. 6.  Aortic Atherosclerosis (ICD10-I70.0). Electronically Signed   By: Anner Crete M.D.   On: 12/05/2016 01:03    Microbiology: Recent Results (from the past 240 hour(s))  Urine culture     Status: None   Collection Time: 12/04/16 10:44 PM  Result Value Ref Range Status   Specimen Description URINE, RANDOM  Final   Special Requests NONE  Final   Culture NO GROWTH  Final   Report Status 12/06/2016 FINAL  Final     Labs: Basic Metabolic Panel:  Recent Labs Lab 12/04/16 2105 12/05/16 0426  NA 126* 125*  K 4.6 4.3  CL 98* 97*  CO2 19* 20*  GLUCOSE 120* 102*  BUN 42* 38*  CREATININE 1.51* 1.41*  CALCIUM 9.2 8.5*   Liver Function Tests:  Recent Labs Lab 12/05/16 0119  AST 59*  ALT 68*  ALKPHOS 103  BILITOT 1.3*  PROT 6.7  ALBUMIN 3.8    Recent Labs Lab 12/05/16 0119  LIPASE 50   No results for input(s): AMMONIA in the last 168  hours. CBC:  Recent Labs Lab 12/04/16 2105 12/05/16 0426  WBC 8.3 6.4  HGB 11.9* 10.9*  HCT 34.6* 32.2*  MCV 92.5 93.1  PLT 266 217   Cardiac Enzymes: No results for input(s): CKTOTAL, CKMB, CKMBINDEX, TROPONINI in the last 168 hours. BNP: BNP (last 3 results) No results for input(s): BNP in the last 8760 hours.  ProBNP (last 3 results) No results for input(s): PROBNP in the last 8760 hours.  CBG: No results for input(s): GLUCAP in the last 168 hours.   Signed:  Velvet Bathe MD.  [facility] 12/06/2016, 5:01 PM

## 2016-12-06 NOTE — Progress Notes (Signed)
Patient wears cotton gown per her request, stating these gowns are the only ones she tolerates.  Complete bath given by Wonda Amis, NT this morning.

## 2016-12-06 NOTE — Consult Note (Signed)
Cardiology Consult    Patient ID: TYKESHA Berg MRN: [id number], DOB/AGE: [birth year]   Admit date: 12/04/2016 Date of Consult: 12/06/2016  Primary Physician: Deland Pretty, MD Reason for Consult: Bradycardia Primary Cardiologist: Dr. Debara Pickett  Requesting Provider: Dr. Wendee Beavers  Patient Profile    [facility] is a 83 y.o. female with past medical history of PAF (not on anticoagulation due to history of chronic subdural hematomas), HTN, and moderate TR who is being seen today for the evaluation of bradycardia at the request of Dr. Wendee Beavers.   History of Present Illness   She presented to [facility] on 12/04/2016 for evaluation of decreased urine output and abdominal discomfort after being treated as an outpatient for a UTI. Labs were without acute abnormalities. Abdominal CT showed moderate bilateral pleural effusions with sigmoid diverticulosis and nonobstructive bilateral renal calculi. While in the ED, she was noted to have a HR in the 30's and she denied any symptoms at that time. Cardiology was contacted and recommended stopping Amiodarone and Lopressor at the time of admission and monitoring on telemetry.   In talking with the patient, she lives with her son but is able to perform ADL's independently. Ambulates with a walker and denies any recent falls. Her son reports she has experienced fatigue over the past month but she denies any recent dizziness, lightheadedness, or presyncope. No recent chest pain, palpitations, orthopnea, PND or lower extremity edema.   In review of telemetry, she has remained bradycardiac with HR initially in the mid-30's but stable in the mid-40's to low-50's over the past day. In reviewing her office visit notes with Dr. Debara Pickett from 08/2016, she was bradycardiac with HR in the mid-50's therefore it was recommended to reduce her Amiodarone dosing from 200mg  daily to 100mg  daily. In talking with the patient, she has continued on 200mg  daily as this was wrote on  the Rx bottle.    Past Medical History   Past Medical History:  Diagnosis Date  . Anxiety   . Aphasia as late effect of stroke 04/03/2014  . Cervical spondylosis   . Chronic kidney disease   . CVA (cerebral infarction) 04/2009   LMCA & posterior cerebral - excellent recovery, mild expressive aphasa  . Diverticulitis   . Gait disorder 04/03/2014  . History of nuclear stress test 06/2009   dipyridamole; negative, normal pattern of perfusion   . Hypertension   . Multiple lung nodules   . Nephrolithiasis   . OA (osteoarthritis)   . Osteopenia   . PAF (paroxysmal atrial fibrillation) (HCC)    a. not on anticoagulation due to history of chronic subdural hematomas  . Valvular heart disease   . Vitamin D deficiency     Past Surgical History:  Procedure Laterality Date  . ABDOMINAL HYSTERECTOMY  1975  . APPENDECTOMY    . CATARACT EXTRACTION Bilateral 2013  . Instestinal Surgery  1987   r/t blockage  . NOSE SURGERY  1985   r/t deviated septum  . REPLACEMENT TOTAL KNEE Right 2004  . TRANSTHORACIC ECHOCARDIOGRAM  09/19/2012   EF 55-60% with normal systolic function; mod AR; MVP with mild-mod regurg; LA severely dilated; RV systolic pressure increased; mod dilated RA; RV systolic pressure increased - mild pulm HTN      Allergies  Allergies  Allergen Reactions  . Amitriptyline Hcl Itching  . Atenolol Other (See Comments)    Hair loss, thin nails, itching  . Azithromycin Other (See Comments)    Mouth sores;  sore throat  . Cefuroxime Other (See Comments)    unknown  . Cephalexin Nausea And Vomiting  . Daypro [Oxaprozin] Other (See Comments)    dyspepsia  . Doxycycline Other (See Comments)    Mouth sores; sore throat  . Famvir [Famciclovir] Other (See Comments)    unknown  . Hyzaar [Losartan Potassium-Hctz] Other (See Comments)    Headache; tight face  . Iodine Other (See Comments)    Involuntary muscle contractions  . Metoprolol Tartrate Itching  . Relafen [Nabumetone]  Other (See Comments)    Mouth sores  . Sterapred [Prednisone] Other (See Comments)    weakness    Inpatient Medications    . [START ON 12/07/2016] amiodarone  100 mg Oral Daily  . amLODipine  10 mg Oral QHS  . aspirin EC  81 mg Oral Daily  . cholecalciferol  1,000 Units Oral Daily  . gabapentin  100 mg Oral QHS  . levothyroxine  50 mcg Oral QAC breakfast    Family History    Family History  Problem Relation Age of Onset  . Lymphoma Mother   . Tuberculosis Father   . Tuberculosis Brother   . Aneurysm Other        grandmother, uncles, cousins  . Lung disease Neg Hx     Social History    Social History   Social History  . Marital status: Widowed    Spouse name: N/A  . Number of children: 4  . Years of education: N/A   Occupational History  .      Retired   Social History Main Topics  . Smoking status: Passive Smoke Exposure - Never Smoker  . Smokeless tobacco: Never Used     Comment: Exposed at work, with her husband, & with her father.  . Alcohol use No  . Drug use: No  . Sexual activity: Not on file   Other Topics Concern  . Not on file   Social History Narrative   Patient is right handed.   No caffeine   Live at home with two sons   Education one year of college.   Retired.      [facility]:   Originally from [facility]. She has always lived in [place]. No international travel. She does have prior travel to [facility]. 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 They have no pets currently. No bird exposure. She does have one plant indoor currently. Does have carpet extensively throughout the home. No mold exposure.      Review of Systems    General:  No chills, fever, night sweats or weight changes. Positive for fatigue.  Cardiovascular:  No chest pain, dyspnea on exertion, edema, orthopnea, palpitations, paroxysmal nocturnal dyspnea. Dermatological: No rash, lesions/masses Respiratory: No cough, dyspnea Urologic: No hematuria, dysuria Abdominal:   No  nausea, vomiting, diarrhea, bright red blood per rectum, melena, or hematemesis. Positive for abdominal pain.  Neurologic:  No visual changes, wkns, changes in mental status. All other systems reviewed and are otherwise negative except as noted above.  Physical Exam    Blood pressure (!) 154/45, pulse (!) 50, temperature (!) 97.5 F (36.4 C), temperature source Oral, resp. rate 18, height 5' (1.524 m), weight 121 lb 11.2 oz (55.2 kg), SpO2 93 %.  General: Pleasant, elderly Caucasian female appearing in NAD Psych: Normal affect. Neuro: Alert and oriented X 3. Moves all extremities spontaneously. HEENT: Normal  Neck: Supple without bruits or JVD. Lungs:  Resp regular and unlabored, CTA without  wheezing or rales. Heart: Regular rhythm, bradycardiac rate, no s3, s4, or murmurs. Abdomen: Soft, non-tender, non-distended, BS + x 4.  Extremities: No clubbing, cyanosis or edema. DP/PT/Radials 2+ and equal bilaterally.  Labs    Troponin [facility] of Care Test)  Recent Labs  12/05/16 0126  TROPIPOC 0.02   No results for input(s): CKTOTAL, CKMB, TROPONINI in the last 72 hours. Lab Results  Component Value Date   WBC 6.4 12/05/2016   HGB 10.9 (L) 12/05/2016   HCT 32.2 (L) 12/05/2016   MCV 93.1 12/05/2016   PLT 217 12/05/2016     Recent Labs Lab 12/05/16 0119 12/05/16 0426  NA  --  125*  K  --  4.3  CL  --  97*  CO2  --  20*  BUN  --  38*  CREATININE  --  1.41*  CALCIUM  --  8.5*  PROT 6.7  --   BILITOT 1.3*  --   ALKPHOS 103  --   ALT 68*  --   AST 59*  --   GLUCOSE  --  102*   Lab Results  Component Value Date   CHOL  03/24/2009    158        ATP III CLASSIFICATION:  <200     mg/dL   Desirable  200-239  mg/dL   Borderline High  >=240    mg/dL   High          HDL 60 03/24/2009   LDLCALC  03/24/2009    86        Total Cholesterol/HDL:CHD Risk Coronary Heart Disease Risk Table                     Men   Women  1/2 Average Risk   3.4   3.3  Average Risk       5.0    4.4  2 X Average Risk   9.6   7.1  3 X Average Risk  23.4   11.0        Use the calculated Patient Ratio above and the CHD Risk Table to determine the patient's CHD Risk.        ATP III CLASSIFICATION (LDL):  <100     mg/dL   Optimal  100-129  mg/dL   Near or Above                    Optimal  130-159  mg/dL   Borderline  160-189  mg/dL   High  >190     mg/dL   Very High   TRIG 62 03/24/2009   Lab Results  Component Value Date   DDIMER 0.77 (H) 11/10/2015     Radiology Studies    Ct Abdomen Pelvis Wo Contrast  Result Date: 12/05/2016 CLINICAL DATA:  83 year old female with left lower quadrant abdominal pain. EXAM: CT ABDOMEN AND PELVIS WITHOUT CONTRAST TECHNIQUE: Multidetector CT imaging of the abdomen and pelvis was performed following the standard protocol without IV contrast. COMPARISON:  Abdominal ultrasound dated 11/30/2013 FINDINGS: Evaluation of this exam is limited in the absence of intravenous contrast. Lower chest: Partially visualized moderate bilateral pleural effusions with associated partial compressive atelectasis of the lower lobes. Pneumonia is not excluded. Clinical correlation is recommended. There is mild cardiomegaly. Partial calcification of the mitral annulus. There is hypoattenuation of the cardiac blood pool suggestive of a degree of anemia. Clinical correlation is recommended. No intra-abdominal free air or free fluid. Hepatobiliary: There  is diffuse liver increased liver attenuation which may be related to underlying hemochromatosis, Wilson's disease, or amiodarone use. Clinical correlation is recommended. A 1.5 cm right hepatic hypodense lesion demonstrates fluid attenuation most likely a cyst. The gallbladder is grossly unremarkable with high attenuating content within the gallbladder may represent sludge or hyper concentrated bile. No pericholecystic fluid. Pancreas: Unremarkable. No pancreatic ductal dilatation or surrounding inflammatory changes. Spleen: Normal  in size without focal abnormality. Adrenals/Urinary Tract: The adrenal glands are unremarkable. There are punctate nonobstructing bilateral renal calculi. No hydronephrosis. The visualized ureters and urinary bladder appear unremarkable. Stomach/Bowel: There is sigmoid diverticulosis with muscular hypertrophy. Mild haziness of the wall of the sigmoid colon, likely chronic. Mild acute diverticulitis is not entirely excluded. Clinical correlation is recommended. No bowel obstruction. Appendectomy. Vascular/Lymphatic: There is advanced aortoiliac atherosclerotic disease. The aorta is tortuous. No portal venous gas. There is no adenopathy. Reproductive: Hysterectomy.  No pelvic mass. Other: There is a 2 cm round fluid attenuating structure in the subcutaneous soft tissues of the left groin likely representing fluid within the inguinal canal or cyst. An enlarged lymph node is less likely but not excluded. This can be further evaluated by ultrasound. Musculoskeletal: There is fatty atrophy of the gluteal and paraspinal musculature consistent with sarcopenia. There is osteopenia and osteoarthritic changes of the hips, left greater right. There is scoliosis with multilevel degenerative changes of the spine and disc desiccation with vacuum phenomena. Old right posterior rib fractures. No acute fracture. IMPRESSION: 1. Moderate bilateral pleural effusions with partial compressive atelectasis of the lower lobes. Pneumonia is not excluded. Clinical correlation is recommended. 2. Mild cardiomegaly with evidence of anemia. 3. Hyperdense liver may be related to underlying deposition disease or medication related. 4. Sigmoid diverticulosis with muscular hypertrophy. And early or mild diverticulitis is less likely but not excluded. Clinical correlation is recommended. No bowel obstruction. 5. Punctate nonobstructing bilateral renal calculi. No hydronephrosis. 6.  Aortic Atherosclerosis (ICD10-I70.0). Electronically Signed   By:  Anner Crete M.D.   On: 12/05/2016 01:03    EKG & Cardiac Imaging    EKG:  Sinus bradycardia, HR 40, with no acute ST or T-wave changes.  - Personally Reviewed  Echocardiogram: 12/15/2014 Study Conclusions  - Left ventricle: The cavity size was normal. Wall thickness was   normal. Systolic function was normal. The estimated ejection   fraction was in the range of 55% to 60%. Wall motion was normal;   there were no regional wall motion abnormalities. - Aortic valve: There was trivial regurgitation. - Mitral valve: There was mild regurgitation. - Left atrium: The atrium was severely dilated. - Right atrium: The atrium was moderately to severely dilated. - Tricuspid valve: There was mild-moderate regurgitation. - Pulmonary arteries: Systolic pressure was mildly increased. PA   peak pressure: 37 mm Hg (S).  Assessment & Plan    1. Sinus Bradycardia - HR initially in the 30's upon arrival to the ED with Amiodarone and Lopressor being held at that time. In reviewing telemetry, she has remained in sinus bradycardia with HR in the mid-40's to 50's. No evidence of CHB or significant pauses. Thankfully, she is asymptomatic with this during admission. Does report general fatigue at home. - she was suppose to reduce her Amiodarone dosing from 200mg  daily to 100mg  daily at her last office visit but continued on 200mg  daily due to this being wrote on the Rx bottle. Discussed with Dr. Sallyanne Kuster. Recommend restarting Amiodarone at 100mg  daily and discontinuing Lopressor at the time of  discharge. Close outpatient Cardiology follow-up with be arranged.   2. Paroxysmal Atrial Fibrillation - maintaining sinus rhythm this admission. Reduce Amiodarone dosing from 200mg  daily to 100mg  daily. Discontinue Lopressor. - This patients CHA2DS2-VASc Score and unadjusted Ischemic Stroke Rate (% per year) is equal to 9.7 % stroke rate/year from a score of 6 (HTN, Female, Age (2), CVA (2)). She is not on  anticoagulation due to history of chronic subdural hematomas  3. Moderate TR - noted on echo in 12/2014.  4. Abdominal Pain - labs without acute abnormalities. Abdominal CT showing moderate bilateral pleural effusions with sigmoid diverticulosis and nonobstructive bilateral renal calculi.  - reports her symptoms have since resolved.  - per admitting team.  Signed, Erma Heritage, PA-C 12/06/2016, 3:15 PM Pager: [phone]  I have seen and examined the patient along with Erma Heritage, PA-C.  I have reviewed the chart, notes and new data.  I agree with PA/NP's note.  Key new complaints: she has profound sinus bradycardia, but her only complaint is reduced energy level. She is still taking amiodarone 200 mg daily at the time of admission, despite the recommended reduction in dose to 100 mg daily athe last appointment with Dr. Debara Pickett. Key examination changes: prominent v waves in the jugulars with expiration, bradycardia, otherwise normal CV exam Key new findings / data: all telemetry tracings and ECGs show sinus bradycardia. No high grade AV block is seen.  PLAN: Stop the beta blocker permanently. Reduce amiodarone to 100 mg daily. Early follow up to reevaluate heart rate. Beta blocker effect should resolve by the end of the week, full impact of change in amiodarone dose will take months.  Sanda Klein, MD, [place] [phone] 12/06/2016, 4:30 PM

## 2016-12-06 NOTE — Progress Notes (Signed)
Reviewed discharge instructions with patient and her son.  Both stated understanding.  No voiced complaints.

## 2016-12-06 NOTE — Consult Note (Signed)
           [facility] CM Primary Care Navigator  12/06/2016  [facility] 10-04-1923 [id number]   Went to see patient at the bedside earlier to identify possible discharge needs but a staff member was currently with patient in the room.  Will attempt to meet with patient at another time when available.   AddendumJeryl Columbia to seepatient at the bedsideto identify possible discharge needs but she had been discharged per RNreport. Patient was discharged home today.  Primary care provider's officeis listed as doing transition of care.    For questions, please contact:  Dannielle Huh, BSN, RN- [facility] Primary Care Navigator  Telephone: [phone] [place]

## 2016-12-06 NOTE — Care Management Note (Signed)
Case Management Note  Patient Details  Name: [facility] MRN: [id number] Date of Birth: 1923/11/04  Subjective/Objective:    From home with son, she has a rolling walker and a cane , she has PCP and medication coverage. MD states he is waiting for Cards to see patient.  ABN delivered to patient , son in room also.                  Action/Plan: NCM will follow for dc needs.   Expected Discharge Date:                  Expected Discharge Plan:     In-House Referral:     Discharge planning Services  CM Consult  Post Acute Care Choice:    Choice offered to:     DME Arranged:    DME Agency:     HH Arranged:    HH Agency:     Status of Service:  In process, will continue to follow  If discussed at Long Length of Stay Meetings, dates discussed:    Additional Comments:  Zenon Mayo, RN 12/06/2016, 4:20 PM

## 2016-12-14 ENCOUNTER — Encounter: Payer: Self-pay | Admitting: Physician Assistant

## 2016-12-14 ENCOUNTER — Ambulatory Visit (INDEPENDENT_AMBULATORY_CARE_PROVIDER_SITE_OTHER): Payer: Medicare Other | Admitting: Physician Assistant

## 2016-12-14 VITALS — BP 153/72 | HR 79 | Wt 106.4 lb

## 2016-12-14 DIAGNOSIS — I10 Essential (primary) hypertension: Secondary | ICD-10-CM

## 2016-12-14 DIAGNOSIS — I480 Paroxysmal atrial fibrillation: Secondary | ICD-10-CM

## 2016-12-14 DIAGNOSIS — I071 Rheumatic tricuspid insufficiency: Secondary | ICD-10-CM

## 2016-12-14 DIAGNOSIS — R001 Bradycardia, unspecified: Secondary | ICD-10-CM | POA: Diagnosis not present

## 2016-12-14 MED ORDER — HYDRALAZINE HCL 10 MG PO TABS: 10.0000 mg | ORAL_TABLET | Freq: Two times a day (BID) | ORAL | 6 refills | Status: DC

## 2016-12-14 NOTE — Patient Instructions (Signed)
Medication Instructions: Your physician recommends that you continue on your current medications as directed. Please refer to the Current Medication list given to you today.  START Hydralazine 10 mg twice daily.   Follow-Up: Your physician recommends that you schedule a follow-up appointment in: 2-3 months with Dr. Debara Pickett.  If you need a refill on your cardiac medications before your next appointment, please call your pharmacy.

## 2016-12-14 NOTE — Progress Notes (Signed)
Cardiology Office Note    Date:  12/16/2016   ID:  Madison Berg, DOB Feb 17, 1924, MRN [id number]  PCP:  Deland Pretty, MD  Cardiologist:  Dr. Debara Pickett   Chief Complaint  Patient presents with  . Follow-up    seen for Dr. Debara Pickett    History of Present Illness:  Madison Berg is a 83 y.o. female with PMH of PAF (not on systemic Anticoagulation due to history of chronic subdural hematoma), HTN, and moderate TR. During his previous visit, he was told to reduce his amiodarone dose from 200 mg daily down to 100 mg daily, however he continued on 200 mg daily as it is written on the bottle. She recently presented to the hospital with decreased urine output and abdominal discomfort after being treated as outpatient for UTI. Abdominal CT showed moderate bilateral pleural effusion with sigmoid diverticulosis and a nonobstructive bilateral renal calculi. While in the ED, she was noted to have heart rate in the 30s. She denies any symptom at the time. TSH was normal. Cardiology was consulted and recommended stopping amiodarone and Lopressor as a time of admission and monitor on telemetry.   Patient presents today for cardiogenic office visit. She denies any dizziness or fatigue. She has been doing well after her recent discontinuation of metoprolol.. His amiodarone was cut down to 100 mg daily. After metoprolol was discontinued, her blood pressure has been drifting on the higher side in the 150s. I will add hydralazine 10 mg twice a day. On physical exam, she does not have any heart failure symptoms including lower extremity edema, orthopnea or paroxysmal nocturnal dyspnea.    Past Medical History:  Diagnosis Date  . Anxiety   . Aphasia as late effect of stroke 04/03/2014  . Cervical spondylosis   . Chronic kidney disease   . CVA (cerebral infarction) 04/2009   LMCA & posterior cerebral - excellent recovery, mild expressive aphasa  . Diverticulitis   . Gait disorder 04/03/2014  . History of  nuclear stress test 06/2009   dipyridamole; negative, normal pattern of perfusion   . Hypertension   . Multiple lung nodules   . Nephrolithiasis   . OA (osteoarthritis)   . Osteopenia   . PAF (paroxysmal atrial fibrillation) (HCC)    a. not on anticoagulation due to history of chronic subdural hematomas  . Valvular heart disease   . Vitamin D deficiency     Past Surgical History:  Procedure Laterality Date  . ABDOMINAL HYSTERECTOMY  1975  . APPENDECTOMY    . CATARACT EXTRACTION Bilateral 2013  . Instestinal Surgery  1987   r/t blockage  . NOSE SURGERY  1985   r/t deviated septum  . REPLACEMENT TOTAL KNEE Right 2004  . TRANSTHORACIC ECHOCARDIOGRAM  09/19/2012   EF 55-60% with normal systolic function; mod AR; MVP with mild-mod regurg; LA severely dilated; RV systolic pressure increased; mod dilated RA; RV systolic pressure increased - mild pulm HTN     Current Medications: Outpatient Medications Prior to Visit  Medication Sig Dispense Refill  . amiodarone (PACERONE) 100 MG tablet Take 1 tablet (100 mg total) by mouth daily. 30 tablet 0  . amLODipine (NORVASC) 10 MG tablet Take 10 mg by mouth at bedtime.     Marland Kitchen aspirin EC 81 MG tablet Take 81 mg by mouth daily.    . B Complex-C (B-COMPLEX WITH VITAMIN C) tablet Take 1 tablet by mouth daily.    . Cholecalciferol 1000 units tablet Take 1,000 Units by  mouth daily.    Marland Kitchen gabapentin (NEURONTIN) 100 MG capsule TAKE 1 CAPSULE AS DIRECTED AT BEDTIME  5  . levothyroxine (SYNTHROID, LEVOTHROID) 50 MCG tablet Take 50 mcg by mouth daily before breakfast.    . Multiple Vitamins-Minerals (MULTIVITAMIN & MINERAL PO) Take 1 tablet by mouth daily.    . nitroGLYCERIN (NITROSTAT) 0.4 MG SL tablet Place 1 tablet (0.4 mg total) under the tongue every 5 (five) minutes as needed for chest pain. 25 tablet 3  . Omega-3 Fatty Acids (FISH OIL PO) Take 1 tablet by mouth at bedtime.     No facility-administered medications prior to visit.      Allergies:    Amitriptyline hcl; Atenolol; Azithromycin; Cefuroxime; Cephalexin; Daypro [oxaprozin]; Doxycycline; Famvir [famciclovir]; Hyzaar [losartan potassium-hctz]; Iodine; Metoprolol tartrate; Relafen [nabumetone]; and Sterapred [prednisone]   Social History   Social History  . Marital status: Widowed    Spouse name: N/A  . Number of children: 4  . Years of education: N/A   Occupational History  .      Retired   Social History Main Topics  . Smoking status: Passive Smoke Exposure - Never Smoker  . Smokeless tobacco: Never Used     Comment: Exposed at work, with her husband, & with her father.  . Alcohol use No  . Drug use: No  . Sexual activity: Not Asked   Other Topics Concern  . None   Social History Narrative   Patient is right handed.   No caffeine   Live at home with two sons   Education one year of college.   Retired.      [facility]:   Originally from [facility] [place] [facility]. She has always lived in [place]. No international travel. She does have prior travel to [facility]. 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 They have no pets currently. No bird exposure. She does have one plant indoor currently. Does have carpet extensively throughout the home. No mold exposure.      Family History:  The patient's family history includes Aneurysm in her other; Lymphoma in her mother; Tuberculosis in her brother and father.   ROS:   Please see the history of present illness.    ROS All other systems reviewed and are negative.   PHYSICAL EXAM:   VS:  BP (!) 153/72   Pulse 79   Wt 106 lb 6.4 oz (48.3 kg)   BMI 20.78 kg/m    GEN: Well nourished, well developed, in no acute distress  HEENT: normal  Neck: no JVD, carotid bruits, or masses Cardiac: RRR; no rubs, or gallops,no edema  1/6 systolic murmur Respiratory:  clear to auscultation bilaterally, normal work of breathing GI: soft, nontender, nondistended, + BS MS: no deformity or atrophy  Skin: warm and dry, no rash Neuro:  Alert and  Oriented x 3, Strength and sensation are intact Psych: euthymic mood, full affect  Wt Readings from Last 3 Encounters:  12/14/16 106 lb 6.4 oz (48.3 kg)  12/05/16 121 lb 11.2 oz (55.2 kg)  11/23/16 111 lb 8 oz (50.6 kg)      Studies/Labs Reviewed:   EKG:  EKG is ordered today.  The ekg ordered today demonstrates Normal sinus rhythm prolonged first degree AV block  Recent Labs: 12/05/2016: ALT 68; BUN 38; Creatinine, Ser 1.41; Hemoglobin 10.9; Platelets 217; Potassium 4.3; Sodium 125; TSH 4.290   Lipid Panel    Component Value Date/Time   CHOL  03/24/2009 0400    158  ATP III CLASSIFICATION:  <200     mg/dL   Desirable  200-239  mg/dL   Borderline High  >=240    mg/dL   High          TRIG 62 03/24/2009 0400   HDL 60 03/24/2009 0400   CHOLHDL 2.6 03/24/2009 0400   VLDL 12 03/24/2009 0400   LDLCALC  03/24/2009 0400    86        Total Cholesterol/HDL:CHD Risk Coronary Heart Disease Risk Table                     Men   Women  1/2 Average Risk   3.4   3.3  Average Risk       5.0   4.4  2 X Average Risk   9.6   7.1  3 X Average Risk  23.4   11.0        Use the calculated Patient Ratio above and the CHD Risk Table to determine the patient's CHD Risk.        ATP III CLASSIFICATION (LDL):  <100     mg/dL   Optimal  100-129  mg/dL   Near or Above                    Optimal  130-159  mg/dL   Borderline  160-189  mg/dL   High  >190     mg/dL   Very High    Additional studies/ records that were reviewed today include:   Echo 12/15/2014 - Left ventricle: The cavity size was normal. Wall thickness was   normal. Systolic function was normal. The estimated ejection   fraction was in the range of 55% to 60%. Wall motion was normal;   there were no regional wall motion abnormalities. - Aortic valve: There was trivial regurgitation. - Mitral valve: There was mild regurgitation. - Left atrium: The atrium was severely dilated. - Right atrium: The atrium was moderately to  severely dilated. - Tricuspid valve: There was mild-moderate regurgitation. - Pulmonary arteries: Systolic pressure was mildly increased. PA   peak pressure: 37 mm Hg (S).   ASSESSMENT:    1. Bradycardia   2. Essential hypertension   3. PAF (paroxysmal atrial fibrillation) ([place])   4. Moderate tricuspid regurgitation      PLAN:  In order of problems listed above:  1. Bradycardia: Heart rate improved after recent continuation of metoprolol, amiodarone was decreased to 100 mg daily. Her heart rate has came up, she also feels better as well. Currently she is no longer on any AV nodal blocking agent, only rate control medication is amiodarone which is antiarrhythmic. This seems to be working very well for her. She continued to have very prolonged first-degree AV block on today's EKG.  2. PAF: Maintaining sinus rhythm on amiodarone based on today's EKG. Not a candidate for systemic anticoagulation due to advanced age and concern for fall.  3. Hypertension: Blood pressure finally elevated, I plan to add hydralazine 10 mg twice a day. Although it is a 3 times a day drug, however I doubt she can take it 3 times a day.    Medication Adjustments/Labs and Tests Ordered: Current medicines are reviewed at length with the patient today.  Concerns regarding medicines are outlined above.  Medication changes, Labs and Tests ordered today are listed in the Patient Instructions below. Patient Instructions  Medication Instructions: Your physician recommends that you continue on your current medications as  directed. Please refer to the Current Medication list given to you today.  START Hydralazine 10 mg twice daily.   Follow-Up: Your physician recommends that you schedule a follow-up appointment in: 2-3 months with Dr. Debara Pickett.  If you need a refill on your cardiac medications before your next appointment, please call your pharmacy.     Hilbert Corrigan, [place]  12/16/2016 6:29 AM    [address], [place], [place]  [address] Phone: [phone]; Fax: [phone]

## 2016-12-16 ENCOUNTER — Encounter: Payer: Self-pay | Admitting: Physician Assistant

## 2017-01-04 ENCOUNTER — Other Ambulatory Visit: Payer: Self-pay | Admitting: *Deleted

## 2017-01-04 MED ORDER — AMIODARONE HCL 100 MG PO TABS: 100.0000 mg | ORAL_TABLET | Freq: Every day | ORAL | 0 refills | Status: DC

## 2017-01-12 ENCOUNTER — Other Ambulatory Visit: Payer: Self-pay | Admitting: Internal Medicine

## 2017-01-12 DIAGNOSIS — R945 Abnormal results of liver function studies: Principal | ICD-10-CM

## 2017-01-12 DIAGNOSIS — R7989 Other specified abnormal findings of blood chemistry: Secondary | ICD-10-CM

## 2017-01-21 ENCOUNTER — Ambulatory Visit
Admission: RE | Admit: 2017-01-21 | Discharge: 2017-01-21 | Disposition: A | Discharge status: Home / Self Care | Payer: Medicare Other | Source: Ambulatory Visit | Attending: Internal Medicine | Admitting: Internal Medicine

## 2017-01-21 DIAGNOSIS — R7989 Other specified abnormal findings of blood chemistry: Secondary | ICD-10-CM

## 2017-01-21 DIAGNOSIS — R945 Abnormal results of liver function studies: Principal | ICD-10-CM

## 2017-01-24 ENCOUNTER — Other Ambulatory Visit (HOSPITAL_COMMUNITY): Payer: Self-pay | Admitting: Internal Medicine

## 2017-01-24 ENCOUNTER — Ambulatory Visit (HOSPITAL_COMMUNITY)
Admission: RE | Admit: 2017-01-24 | Discharge: 2017-01-24 | Disposition: A | Discharge status: Home / Self Care | Payer: Medicare Other | Source: Ambulatory Visit | Attending: Surgery | Admitting: Surgery

## 2017-01-24 DIAGNOSIS — R600 Localized edema: Secondary | ICD-10-CM

## 2017-02-04 ENCOUNTER — Telehealth: Payer: Self-pay | Admitting: Neurology

## 2017-02-04 MED ORDER — GABAPENTIN 100 MG PO CAPS: 100.0000 mg | ORAL_CAPSULE | Freq: Three times a day (TID) | ORAL | 3 refills | Status: DC

## 2017-02-04 NOTE — Telephone Encounter (Signed)
I called and talked with the son. The patient has had some problems with neuropathy pain, she has had an increase in burning sensations in the legs recently. She was recently treated for what sounds like a cellulitis, she is off antibodies now.  She takes gabapentin 100 mg at night, the patient believes that she could tolerate higher doses, we will start 100 mg 3 times daily.

## 2017-02-04 NOTE — Telephone Encounter (Signed)
Patients son Kiran Lapine (listed on DPR) called office in reference to his mother having bilateral feet numbness (partial) with a feeling of heat and swelling in bilateral legs from the knee down but the right leg is worse.  Patient was recently diagnosed with Dr. Pennie Banter office with an infection completing an antibiotic along with having an [place] to rule out DVT (negative). Pharmacy- [facility].  Please call Leory Plowman

## 2017-02-04 NOTE — Addendum Note (Signed)
Addended by: Kathrynn Ducking on: 02/04/2017 02:31 PM   Modules accepted: Orders

## 2017-02-12 ENCOUNTER — Encounter (HOSPITAL_COMMUNITY): Payer: Self-pay | Admitting: Emergency Medicine

## 2017-02-12 DIAGNOSIS — Z79899 Other long term (current) drug therapy: Secondary | ICD-10-CM | POA: Insufficient documentation

## 2017-02-12 DIAGNOSIS — N189 Chronic kidney disease, unspecified: Secondary | ICD-10-CM | POA: Insufficient documentation

## 2017-02-12 DIAGNOSIS — I129 Hypertensive chronic kidney disease with stage 1 through stage 4 chronic kidney disease, or unspecified chronic kidney disease: Secondary | ICD-10-CM | POA: Insufficient documentation

## 2017-02-12 DIAGNOSIS — S8991XA Unspecified injury of right lower leg, initial encounter: Secondary | ICD-10-CM | POA: Diagnosis present

## 2017-02-12 DIAGNOSIS — Y929 Unspecified place or not applicable: Secondary | ICD-10-CM | POA: Diagnosis not present

## 2017-02-12 DIAGNOSIS — Z7722 Contact with and (suspected) exposure to environmental tobacco smoke (acute) (chronic): Secondary | ICD-10-CM | POA: Insufficient documentation

## 2017-02-12 DIAGNOSIS — X58XXXA Exposure to other specified factors, initial encounter: Secondary | ICD-10-CM | POA: Insufficient documentation

## 2017-02-12 DIAGNOSIS — Z7982 Long term (current) use of aspirin: Secondary | ICD-10-CM | POA: Diagnosis not present

## 2017-02-12 DIAGNOSIS — Y999 Unspecified external cause status: Secondary | ICD-10-CM | POA: Insufficient documentation

## 2017-02-12 DIAGNOSIS — Z8673 Personal history of transient ischemic attack (TIA), and cerebral infarction without residual deficits: Secondary | ICD-10-CM | POA: Insufficient documentation

## 2017-02-12 DIAGNOSIS — S82821A Torus fracture of lower end of right fibula, initial encounter for closed fracture: Secondary | ICD-10-CM | POA: Diagnosis not present

## 2017-02-12 DIAGNOSIS — Y939 Activity, unspecified: Secondary | ICD-10-CM | POA: Insufficient documentation

## 2017-02-12 LAB — CBC WITH DIFFERENTIAL/PLATELET
BASOS ABS: 0 10*3/uL (ref 0.0–0.1)
BASOS PCT: 0 %
EOS PCT: 0 %
Eosinophils Absolute: 0 10*3/uL (ref 0.0–0.7)
HCT: 37.4 % (ref 36.0–46.0)
Hemoglobin: 12.2 g/dL (ref 12.0–15.0)
LYMPHS PCT: 6 %
Lymphs Abs: 0.7 10*3/uL (ref 0.7–4.0)
MCH: 31.5 pg (ref 26.0–34.0)
MCHC: 32.6 g/dL (ref 30.0–36.0)
MCV: 96.6 fL (ref 78.0–100.0)
MONO ABS: 0.3 10*3/uL (ref 0.1–1.0)
MONOS PCT: 3 %
Neutro Abs: 9.5 10*3/uL — ABNORMAL HIGH (ref 1.7–7.7)
Neutrophils Relative %: 91 %
PLATELETS: 252 10*3/uL (ref 150–400)
RBC: 3.87 MIL/uL (ref 3.87–5.11)
RDW: 16.8 % — AB (ref 11.5–15.5)
WBC: 10.5 10*3/uL (ref 4.0–10.5)

## 2017-02-12 LAB — BASIC METABOLIC PANEL
ANION GAP: 10 (ref 5–15)
BUN: 37 mg/dL — ABNORMAL HIGH (ref 6–20)
CALCIUM: 9.2 mg/dL (ref 8.9–10.3)
CO2: 23 mmol/L (ref 22–32)
CREATININE: 1.53 mg/dL — AB (ref 0.44–1.00)
Chloride: 104 mmol/L (ref 101–111)
GFR, EST AFRICAN AMERICAN: 33 mL/min — AB (ref >60)
GFR, EST NON AFRICAN AMERICAN: 29 mL/min — AB (ref >60)
Glucose, Bld: 118 mg/dL — ABNORMAL HIGH (ref 65–99)
Potassium: 4.8 mmol/L (ref 3.5–5.1)
SODIUM: 137 mmol/L (ref 135–145)

## 2017-02-12 NOTE — ED Triage Notes (Signed)
Pt brought to Ed by Son for increase right leg pain, redness and swollen, pt had a vascular study 2 weeks ago to r/o DVT and results were negative for DVT, pt was placed on Gabapentin 100 mg daily and increased to 100 mg x 3 daily pt states she is not getting any relief and it is getting worse, no fever or chills.

## 2017-02-13 ENCOUNTER — Emergency Department (HOSPITAL_COMMUNITY)
Admission: EM | Admit: 2017-02-13 | Discharge: 2017-02-13 | Disposition: A | Discharge status: Home / Self Care | Payer: Medicare Other | Attending: Emergency Medicine | Admitting: Emergency Medicine

## 2017-02-13 ENCOUNTER — Emergency Department (HOSPITAL_COMMUNITY): Payer: Medicare Other

## 2017-02-13 DIAGNOSIS — S82821A Torus fracture of lower end of right fibula, initial encounter for closed fracture: Secondary | ICD-10-CM

## 2017-02-13 MED ORDER — DOCUSATE SODIUM 100 MG PO CAPS: 100.0000 mg | ORAL_CAPSULE | Freq: Two times a day (BID) | ORAL | 0 refills | Status: DC

## 2017-02-13 MED ORDER — HYDROCODONE-ACETAMINOPHEN 5-325 MG PO TABS
1.0000 | ORAL_TABLET | Freq: Once | ORAL | Status: AC
  Administered 2017-02-13: 1 via ORAL
  Filled 2017-02-13: qty 1

## 2017-02-13 MED ORDER — HYDROCODONE-ACETAMINOPHEN 5-325 MG PO TABS: 1.0000 | ORAL_TABLET | Freq: Four times a day (QID) | ORAL | 0 refills | Status: DC | PRN

## 2017-02-13 NOTE — Progress Notes (Signed)
Orthopedic Tech Progress Note Patient Details:  Madison Berg 02-27-1924 [id number]  Ortho Devices Type of Ortho Device: Ace wrap, Post (short leg) splint Ortho Device/Splint Location: rle Ortho Device/Splint Interventions: Application   Madison Berg 02/13/2017, 7:53 AM

## 2017-02-13 NOTE — ED Notes (Signed)
Ortho paged for leg splint

## 2017-02-13 NOTE — ED Notes (Signed)
Ortho at bedside to apply splint.

## 2017-02-13 NOTE — ED Provider Notes (Signed)
TIME SEEN: 5:42 AM  CHIEF COMPLAINT: Right leg pain  HPI: Pt is a 83 y.o. female with history of CVA, chronic kidney disease, osteopenia who presents to the emergency department with right leg discoloration, swelling and pain for the past 2-3 weeks. She was seen by the PA at her primary care physician's office, Dr. Deland Pretty, and had an outpatient venous Doppler which was negative for DVT. Reports she was also placed on antibiotics for 10 days with no change in her symptoms. She is able to ambulate with a walker which is her baseline. It does hurt to palpate the posterior calf and it does hurt with walking. She denies chest pain or shortness of breath. Denies any recent injury to this leg. No numbness or tingling in the leg. No focal weakness.  ROS: See HPI Constitutional: no fever  Eyes: no drainage  ENT: no runny nose   Cardiovascular:  no chest pain  Resp: no SOB  GI: no vomiting GU: no dysuria Integumentary: no rash  Allergy: no hives  Musculoskeletal: no leg swelling  Neurological: no slurred speech ROS otherwise negative  PAST MEDICAL HISTORY/PAST SURGICAL HISTORY:  Past Medical History:  Diagnosis Date  . Anxiety   . Aphasia as late effect of stroke 04/03/2014  . Cervical spondylosis   . Chronic kidney disease   . CVA (cerebral infarction) 04/2009   LMCA & posterior cerebral - excellent recovery, mild expressive aphasa  . Diverticulitis   . Gait disorder 04/03/2014  . History of nuclear stress test 06/2009   dipyridamole; negative, normal pattern of perfusion   . Hypertension   . Multiple lung nodules   . Nephrolithiasis   . OA (osteoarthritis)   . Osteopenia   . PAF (paroxysmal atrial fibrillation) (HCC)    a. not on anticoagulation due to history of chronic subdural hematomas  . Valvular heart disease   . Vitamin D deficiency     MEDICATIONS:  Prior to Admission medications   Medication Sig Start Date End Date Taking? Authorizing Provider  amiodarone (PACERONE)  100 MG tablet Take 1 tablet (100 mg total) by mouth daily. 01/04/17   Hilty, Nadean Corwin, MD  amLODipine (NORVASC) 10 MG tablet Take 10 mg by mouth at bedtime.     [provider]  aspirin EC 81 MG tablet Take 81 mg by mouth daily.    [provider]  B Complex-C (B-COMPLEX WITH VITAMIN C) tablet Take 1 tablet by mouth daily.    [provider]  Cholecalciferol 1000 units tablet Take 1,000 Units by mouth daily.    [provider]  gabapentin (NEURONTIN) 100 MG capsule Take 1 capsule (100 mg total) by mouth 3 (three) times daily. 02/04/17   Kathrynn Ducking, MD  hydrALAZINE (APRESOLINE) 10 MG tablet Take 1 tablet (10 mg total) by mouth 2 (two) times daily. 12/14/16   Almyra Deforest, PA  levothyroxine (SYNTHROID, LEVOTHROID) 50 MCG tablet Take 50 mcg by mouth daily before breakfast.    [provider]  Multiple Vitamins-Minerals (MULTIVITAMIN & MINERAL PO) Take 1 tablet by mouth daily.    [provider]  nitroGLYCERIN (NITROSTAT) 0.4 MG SL tablet Place 1 tablet (0.4 mg total) under the tongue every 5 (five) minutes as needed for chest pain. 07/02/15   Hilty, Nadean Corwin, MD  Omega-3 Fatty Acids (FISH OIL PO) Take 1 tablet by mouth at bedtime.    [provider]    ALLERGIES:  Allergies  Allergen Reactions  . Amitriptyline Hcl  Itching  . Atenolol Other (See Comments)    Hair loss, thin nails, itching  . Azithromycin Other (See Comments)    Mouth sores; sore throat  . Cefuroxime Other (See Comments)    unknown  . Cephalexin Nausea And Vomiting  . Daypro [Oxaprozin] Other (See Comments)    dyspepsia  . Doxycycline Other (See Comments)    Mouth sores; sore throat  . Famvir [Famciclovir] Other (See Comments)    unknown  . Hyzaar [Losartan Potassium-Hctz] Other (See Comments)    Headache; tight face  . Iodine Other (See Comments)    Involuntary muscle contractions  . Metoprolol Tartrate Itching  . Relafen [Nabumetone] Other (See Comments)      Mouth sores  . Sterapred [Prednisone] Other (See Comments)    weakness    SOCIAL HISTORY:  Social History  Substance Use Topics  . Smoking status: Passive Smoke Exposure - Never Smoker  . Smokeless tobacco: Never Used     Comment: Exposed at work, with her husband, & with her father.  . Alcohol use No    FAMILY HISTORY: Family History  Problem Relation Age of Onset  . Lymphoma Mother   . Tuberculosis Father   . Tuberculosis Brother   . Aneurysm Other        grandmother, uncles, cousins  . Lung disease Neg Hx     EXAM: BP (!) 132/59 (BP Location: Left Arm)   Pulse 76   Temp 98.5 F (36.9 C) (Oral)   Resp 18   Ht 5' (1.524 m)   Wt 49.9 kg (110 lb)   SpO2 98%   BMI 21.48 kg/m  CONSTITUTIONAL: Alert and oriented and responds appropriately to questions. Elderly, thin, chronically ill-appearing HEAD: Normocephalic EYES: Conjunctivae clear, pupils appear equal, EOMI ENT: normal nose; moist mucous membranes NECK: Supple, no meningismus, no nuchal rigidity, no LAD  CARD: RRR; S1 and S2 appreciated; no murmurs, no clicks, no rubs, no gallops RESP: Normal chest excursion without splinting or tachypnea; breath sounds clear and equal bilaterally; no wheezes, no rhonchi, no rales, no hypoxia or respiratory distress, speaking full sentences ABD/GI: Normal bowel sounds; non-distended; soft, non-tender, no rebound, no guarding, no peritoneal signs, no hepatosplenomegaly BACK:  The back appears normal and is non-tender to palpation, there is no CVA tenderness EXT: Patient has a surgical scar over the anterior right knee as she is status post right total knee arthroplasty. Patient does have swelling in the right leg compared to the left without pitting edema. 2+ easily palpable DP pulses bilaterally. Extremities are warm and well-perfused. Discoloration in the right leg compared to the left consistent with venous stasis dermatitis. She does have some erythema to the posterior calf  without induration, warmth. No fluctuance. No bony tenderness on exam does have a raised area over the proximal anterior right tibia. No joint effusion. Normal ROM in all joints; otherwise extremities are non-tender to palpation; no edema; normal capillary refill; no cyanosis, compartments are soft SKIN: Normal color for age and race; warm; no rash NEURO: Moves all extremities equally, normal sensation diffusely, normal speech, no facial asymmetry PSYCH: The patient's mood and manner are appropriate. Grooming and personal hygiene are appropriate.  MEDICAL DECISION MAKING: Urine what looks like venous stasis dermatitis versus ecchymosis to the right leg. There is swelling compared to the left side but has had recent venous Doppler which was negative. Has also been on antibiotics with no improvement. This time this does not look like infection to me. Doubt cellulitis.  No sign of abscess, compartment syndrome, gout, septic arthritis on exam. Given she is 19 with history of osteopenia, will obtain x-rays of the knee, tibia and fibula and ankle. Her labs are unremarkable other than chronic kidney disease. No leukocytosis. She denies recent fever. Doubt arterial obstruction given strong palpable DP pulses bilaterally. No focal neurologic deficits on exam.  ED PROGRESS: Patient has a subacute distal fibula fracture with posterior displacement. I will place her in a short leg splint. We will keep her nonweightbearing. She lives at home with 2 of her sons who can help care for her. She has a wheelchair as well as a potty chair at home that she can use. She will be nonweightbearing. We'll have her follow-up with orthopedics as an outpatient. She states she would like to see Dr. Maureen Ralphs with [facility] orthopedics as he has done her right knee replacement 15 years ago. Patient and family comfortable with plan for discharge home. She is neurovascularly intact distally.  At this time, I do not feel there is any  life-threatening condition present. I have reviewed and discussed all results (EKG, imaging, lab, urine as appropriate) and exam findings with patient/family. I have reviewed nursing notes and appropriate previous records.  I feel the patient is safe to be discharged home without further emergent workup and can continue workup as an outpatient as needed. Discussed usual and customary return precautions. Patient/family verbalize understanding and are comfortable with this plan.  Outpatient follow-up has been provided if needed. All questions have been answered.    SPLINT APPLICATION Date/Time: 3:41 AM Authorized by: Nyra Jabs Consent: Verbal consent obtained. Risks and benefits: risks, benefits and alternatives were discussed Consent given by: patient Splint applied by: orthopedic technician Location details: right leg Splint type: posterior short leg Supplies used: fiberglass Post-procedure: The splinted body part was neurovascularly unchanged following the procedure. Patient tolerance: Patient tolerated the procedure well with no immediate complications.            Ward, Delice Bison, DO 02/13/17 [phone]

## 2017-02-13 NOTE — Discharge Instructions (Signed)
Please follow-up with [facility] orthopedics. Please call Monday morning to schedule an appointment. Please keep your splint clean and dry. Please use your wheelchair. You are not to bear any weight on this leg until cleared by orthopedics.

## 2017-02-14 ENCOUNTER — Telehealth: Payer: Self-pay | Admitting: Internal Medicine

## 2017-02-14 NOTE — Telephone Encounter (Signed)
New message    Pt son is calling to find out current dosage of medication. She was discharged from hospital yesterday.   Pt c/o medication issue:  1. Name of Medication: amiodarone   2. How are you currently taking this medication (dosage and times per day)? Last prescription 100 mg  3. Are you having a reaction (difficulty breathing--STAT)? No   4. What is your medication issue? Pt son is calling to clarify dose of medication.

## 2017-02-14 NOTE — Telephone Encounter (Signed)
S/w Madison Berg (SON) (DPR) he is calling to check the doses of her 2 medications amiodarone 100mg  QD (this was decreased at her LOV with Isaac Laud from 200mg  at the hospital visit.) he states that pt has told him that she was to increase at last visit 100mg  this would make her dose 300mg  daily, he states that he thought that this dose was way too high and was calling to check. He states that he will change this and educate pt/ also he will call ing to see if any other medications were changed at that visit. Informed son that we also started hydralazine 10mg  BID that day he states that pt was only taking once daily he will change this also and educate pt. He states that everything has been fine he was just concerned about the amiodarone dosage.

## 2017-02-14 NOTE — Telephone Encounter (Signed)
I think there has been some confusion regarding the medication and dosage, as indicated in my last note, she is to continue on 100mg  daily amiodarone. As indicated by neurology note from Dr. Jannifer Franklin, he recommended the patient to increase neurontin to 100mg  TID for her neuropathy. Please check to see if she mixed up the 2 medications and recommending providers?

## 2017-02-14 NOTE — Telephone Encounter (Signed)
Left detailed message on BRADLEY Clyne (SON) (DPR)

## 2017-02-15 ENCOUNTER — Ambulatory Visit: Payer: Medicare Other | Admitting: Physician Assistant

## 2017-02-22 ENCOUNTER — Other Ambulatory Visit: Payer: Self-pay

## 2017-02-22 DIAGNOSIS — M79604 Pain in right leg: Secondary | ICD-10-CM

## 2017-03-01 ENCOUNTER — Ambulatory Visit: Payer: Medicare Other | Admitting: Internal Medicine

## 2017-03-10 ENCOUNTER — Encounter: Payer: Self-pay | Admitting: Internal Medicine

## 2017-03-10 ENCOUNTER — Ambulatory Visit (INDEPENDENT_AMBULATORY_CARE_PROVIDER_SITE_OTHER): Payer: Medicare Other | Admitting: Internal Medicine

## 2017-03-10 VITALS — BP 137/73 | HR 80 | Ht 60.0 in | Wt 115.4 lb

## 2017-03-10 DIAGNOSIS — I480 Paroxysmal atrial fibrillation: Secondary | ICD-10-CM

## 2017-03-10 DIAGNOSIS — R600 Localized edema: Secondary | ICD-10-CM

## 2017-03-10 DIAGNOSIS — I10 Essential (primary) hypertension: Secondary | ICD-10-CM

## 2017-03-10 DIAGNOSIS — I351 Nonrheumatic aortic (valve) insufficiency: Secondary | ICD-10-CM

## 2017-03-10 NOTE — Progress Notes (Signed)
OFFICE NOTE  Chief Complaint:  Routine follow-up  Primary Care Physician: Deland Pretty, MD  HPI:  Madison Berg is a pleasant 83 year old female followed for years by Dr. [name] with a history of paroxysmal atrial fibrillation. She had a stroke in 6301 with an embolic event to her left middle cerebral artery and posterior cerebral artery. She recovered fairly well except she has some mild expressive aphasia. Chest is a history of aortic insufficiency which has been moderate and was recently stable on an echocardiogram in May of 2014. Her ejection fraction is preserved and she said no ischemia. She had a Myoview stress test in February 2011 which was negative. She is on warfarin therapy and her INRs have been therapeutic and stable. She has no complaints of chest pain or shortness of breath. She maintains on amiodarone for rhythm control with her atrial fibrillation. She had recent thyroid function which was in normal limits, but I do not see recent pulmonary function testing. She will need repeat of this testing at her next office visit. Her other main complaint today is problems with bladder incontinence. She previously had seen Dr. Elder Negus and he had advised against medications for incontinence, but her problems have gotten worse and she may be a good candidate for this medication. I understand that she may have had a bladder surgery in the past.  Mrs. [name] has no new complaints today. She denies any worsening shortness of breath or chest pain. She is again concerned about hair loss, however I do not appreciate any significant worsening of the thinning of her hair. She is asked whether it is okay for her to use biotin supplements. I do not have a problem with that. She has had a couple of more falls but is resistant to using a walker due to decreased mobility and independence. She is since stopped driving.  I saw [name] back in the office today. Unfortunately since I last  saw her she continued to have gait instability and had numerous falls. Ultimately she developed a subdural hematoma and was felt to be a very poor candidate at that point for anticoagulation with warfarin. This was discontinued. She is maintained on aspirin.  [name] is doing fairly well. She's had a couple of episodes of cerebral hemorrhage and was taken off of warfarin for this. She's had no falls recently and uses a 4 point walker. She fortunately has had very little A. fib and remains on low-dose amiodarone 200 mg daily.  I had the pleasure seeing [name] back in the office today for follow-up. Recently she was seen in the ER for an episode of chest pain. She apparently took 2 nitroglycerin for this and her symptoms eventually resolved. She denies any burning under the tongue and therefore her medicine, which is probably quite old me not been effective. Since then she's had no further symptoms. She says she feels great today.  02/05/2016  [name] returns today for follow-up. She's been very stable. She denies any recurrent chest pain. She is maintaining sinus rhythm on low-dose amiodarone and aspirin. Anticoagulation beyond this is contraindicated due to falls and chronic subdural hematomas. She has stable moderate aortic insufficiency with a wide pulse pressure and denies worsening shortness of breath with exertion. She's very concerned about chronic cough. She again asked today if her metoprolol was causing this. I'm unaware of this medicine causing this for her. Amiodarone could feasibly cause this if there were pulmonary fibrosis, however I  do not hear any clinical signs of that on exam. She is scheduled to have upcoming pulmonary function testing and a follow-up appointment with pulmonary.  08/31/2016  [name] returns for follow-up. She reports a couple time she's had episodes where she felt like she was going to pass out. She becomes profoundly weak. Is not clear what her  blood pressure does during these episodes. It may be playing a role in her falls. She had another fall in February and therefore is not anticoagulated. She's been on amiodarone and maintaining sinus rhythm. She also is in bradycardia and this could be playing a role in her weakness as well.  03/10/2017  [name] was seen today in follow-up.  She presents with her son who I met the first time today.  She has had no further episodes of feeling like she might pass out.  Recently however she was in the emergency department with leg discoloration and pain and was felt to have venous insufficiency.  She has venous reflux studies upcoming and is scheduled to see Dr. Doren Custard.  She is also on some neuropathic pain medicine through her neurologist.  There is no evidence for recurrent atrial fibrillation and she maintains on low-dose amiodarone.  She is not anticoagulated due to fall risk and only takes low-dose aspirin.  PMHx:  Past Medical History:  Diagnosis Date  . Anxiety   . Aphasia as late effect of stroke 04/03/2014  . Cervical spondylosis   . Chronic kidney disease   . CVA (cerebral infarction) 04/2009   LMCA & posterior cerebral - excellent recovery, mild expressive aphasa  . Diverticulitis   . Gait disorder 04/03/2014  . History of nuclear stress test 06/2009   dipyridamole; negative, normal pattern of perfusion   . Hypertension   . Multiple lung nodules   . Nephrolithiasis   . OA (osteoarthritis)   . Osteopenia   . PAF (paroxysmal atrial fibrillation) (HCC)    a. not on anticoagulation due to history of chronic subdural hematomas  . Valvular heart disease   . Vitamin D deficiency     Past Surgical History:  Procedure Laterality Date  . ABDOMINAL HYSTERECTOMY  1975  . APPENDECTOMY    . CATARACT EXTRACTION Bilateral 2013  . Instestinal Surgery  1987   r/t blockage  . NOSE SURGERY  1985   r/t deviated septum  . REPLACEMENT TOTAL KNEE Right 2004  . TRANSTHORACIC ECHOCARDIOGRAM   09/19/2012   EF 55-60% with normal systolic function; mod AR; MVP with mild-mod regurg; LA severely dilated; RV systolic pressure increased; mod dilated RA; RV systolic pressure increased - mild pulm HTN     FAMHx:  Family History  Problem Relation Age of Onset  . Lymphoma Mother   . Tuberculosis Father   . Tuberculosis Brother   . Aneurysm Other        grandmother, uncles, cousins  . Lung disease Neg Hx     SOCHx:   reports that she is a non-smoker but has been exposed to tobacco smoke. she has never used smokeless tobacco. She reports that she does not drink alcohol or use drugs.  ALLERGIES:  Allergies  Allergen Reactions  . Amitriptyline Hcl Itching  . Atenolol Other (See Comments)    Hair loss, thin nails, itching  . Azithromycin Other (See Comments)    Mouth sores; sore throat  . Cefuroxime Other (See Comments)    unknown  . Cephalexin Nausea And Vomiting  . Daypro [Oxaprozin] Other (See Comments)  dyspepsia  . Doxycycline Other (See Comments)    Mouth sores; sore throat  . Famvir [Famciclovir] Other (See Comments)    unknown  . Hyzaar [Losartan Potassium-Hctz] Other (See Comments)    Headache; tight face  . Iodine Other (See Comments)    Involuntary muscle contractions  . Metoprolol Tartrate Itching  . Relafen [Nabumetone] Other (See Comments)    Mouth sores  . Sterapred [Prednisone] Other (See Comments)    weakness    ROS: Pertinent items noted in HPI and remainder of comprehensive ROS otherwise negative.  HOME MEDS: Current Outpatient Medications  Medication Sig Dispense Refill  . amiodarone (PACERONE) 100 MG tablet Take 1 tablet (100 mg total) by mouth daily. 90 tablet 0  . amLODipine (NORVASC) 10 MG tablet Take 10 mg by mouth at bedtime.     Marland Kitchen aspirin EC 81 MG tablet Take 81 mg by mouth daily.    . B Complex-C (B-COMPLEX WITH VITAMIN C) tablet Take 1 tablet by mouth daily.    . Cholecalciferol 1000 units tablet Take 1,000 Units by mouth daily.    Marland Kitchen  docusate sodium (COLACE) 100 MG capsule Take 1 capsule (100 mg total) by mouth every 12 (twelve) hours. 60 capsule 0  . gabapentin (NEURONTIN) 100 MG capsule Take 1 capsule (100 mg total) by mouth 3 (three) times daily. (Patient taking differently: Take 100 mg by mouth 2 (two) times daily. ) 90 capsule 3  . hydrALAZINE (APRESOLINE) 10 MG tablet Take 1 tablet (10 mg total) by mouth 2 (two) times daily. 60 tablet 6  . HYDROcodone-acetaminophen (NORCO/VICODIN) 5-325 MG tablet Take 1 tablet by mouth every 6 (six) hours as needed. 15 tablet 0  . levothyroxine (SYNTHROID, LEVOTHROID) 50 MCG tablet Take 50 mcg by mouth daily before breakfast.    . Multiple Vitamins-Minerals (MULTIVITAMIN & MINERAL PO) Take 1 tablet by mouth daily.    . nitroGLYCERIN (NITROSTAT) 0.4 MG SL tablet Place 1 tablet (0.4 mg total) under the tongue every 5 (five) minutes as needed for chest pain. 25 tablet 3  . Omega-3 Fatty Acids (FISH OIL PO) Take 1 tablet by mouth at bedtime.     No current facility-administered medications for this visit.     LABS/IMAGING: No results found for this or any previous visit (from the past 48 hour(s)). No results found.  VITALS: BP 137/73   Pulse 80   Ht 5' (1.524 m)   Wt 115 lb 6.4 oz (52.3 kg)   BMI 22.54 kg/m   EXAM: General appearance: alert and no distress Neck: no carotid bruit and no JVD Lungs: clear to auscultation bilaterally Heart: regular rate and rhythm, S1, S2 normal and diastolic murmur: holodiastolic 2/6, blowing at 2nd right intercostal space Abdomen: soft, non-tender; bowel sounds normal; no masses,  no organomegaly Extremities: extremities normal, atraumatic, no cyanosis or edema Pulses: 2+ and symmetric Skin: Skin color, texture, turgor normal. No rashes or lesions Neurologic: Grossly normal, marked expressive aphasia Psych: Mood, affect normal  EKG: Sinus rhythm with sinus arrhythmia and first-degree AV block at 80-personally  reviewed  ASSESSMENT: 1. Stable moderate aortic insufficiency 2. Paroxysmal atrial fibrillation - CHADSVASC 4- on aspirin and amiodarone 3. Hypertension-controlled 4. Dizziness with falls 5. Chronic subdural hematomas - warfarin is contraindicated 6. Chest pain 7. Chronic cough 8. Presyncope  PLAN: 1.   Mrs. Tegeler has not had any recurrent atrial fibrillation on low-dose amiodarone.  She has been struggling with lower extremity venous congestion and might likely have venous insufficiency.  She is going to follow-up with Dr. Doren Custard in a few weeks.  Her echo it showed stable moderate aortic insufficiency however recently her aortic insufficiency was thought to be more mild.  It is unlikely she be a heart valve candidate at this point given her age.  We will continue to monitor it clinically.    Follow-up in 6 months.   Pixie Casino, MD, [facility] Attending Cardiologist [place] 03/10/2017, 3:37 PM

## 2017-03-10 NOTE — Patient Instructions (Addendum)
Your physician recommends that you continue on your current medications as directed. Please refer to the Current Medication list given to you today.  Your physician wants you to follow-up in: [place] with Dr. Debara Pickett. You will receive a reminder letter in the mail two months in advance. If you don't receive a letter, please call our office to schedule the follow-up appointment.

## 2017-03-17 ENCOUNTER — Ambulatory Visit (INDEPENDENT_AMBULATORY_CARE_PROVIDER_SITE_OTHER): Payer: Medicare Other | Admitting: Vascular Surgery

## 2017-03-17 ENCOUNTER — Other Ambulatory Visit: Payer: Self-pay

## 2017-03-17 ENCOUNTER — Encounter: Payer: Self-pay | Admitting: Vascular Surgery

## 2017-03-17 ENCOUNTER — Other Ambulatory Visit: Payer: Self-pay | Admitting: Internal Medicine

## 2017-03-17 ENCOUNTER — Ambulatory Visit (HOSPITAL_COMMUNITY)
Admission: RE | Admit: 2017-03-17 | Discharge: 2017-03-17 | Disposition: A | Discharge status: Home / Self Care | Payer: Medicare Other | Source: Ambulatory Visit | Attending: Vascular Surgery | Admitting: Vascular Surgery

## 2017-03-17 VITALS — BP 142/72 | HR 75 | Temp 97.0°F | Resp 16 | Ht 60.0 in | Wt 115.0 lb

## 2017-03-17 DIAGNOSIS — M7989 Other specified soft tissue disorders: Secondary | ICD-10-CM | POA: Insufficient documentation

## 2017-03-17 DIAGNOSIS — I872 Venous insufficiency (chronic) (peripheral): Secondary | ICD-10-CM | POA: Diagnosis not present

## 2017-03-17 DIAGNOSIS — M79604 Pain in right leg: Secondary | ICD-10-CM | POA: Insufficient documentation

## 2017-03-17 DIAGNOSIS — I739 Peripheral vascular disease, unspecified: Secondary | ICD-10-CM

## 2017-03-17 DIAGNOSIS — R609 Edema, unspecified: Secondary | ICD-10-CM | POA: Diagnosis present

## 2017-03-17 NOTE — Progress Notes (Signed)
Patient name: Madison Berg MRN: [id number] DOB: December 19, 1923 Sex: female   REASON FOR CONSULT:    Right leg pain and swelling.  The consult is requested by Dr. Deland Pretty  HPI:   Madison MCCLEESE is a pleasant 83 y.o. female, who has had some swelling in the right leg for approximately 4 months according to the patient and her son.  Apparently she fell over a dishwasher and injured her right leg earlier this year.  She required 7 stitches.  This wound has ultimately healed.  She does have a small wound on the dorsum of the right foot where she had a foot brace that rubbed up against her foot.  This has been healing.  It has been present for several weeks.  I do not get any clear-cut history of claudication although I think her activity is fairly limited.  She is ambulatory with a walker.  I do not get any history of rest pain.  She has had a previous stroke in the past with some expressive a aphasia.  She does have a history of some underlying chronic kidney disease.  I have reviewed the records that were sent with the patient from the referring office.  Patient has had some right leg swelling and redness.  The patient was felt to have cellulitis and was started on Keflex.  This was a note in September.  Duplex at that time was unremarkable for DVT.  The patient's cardiologist is Dr. Lyman Bishop.  She was last seen on 03/10/2017.  I have reviewed this note.  She is followed with paroxysmal atrial fibrillation.  She had a previous stroke in 2010 and has some residual mild expressive aphasia from that.  She is maintained on Coumadin.  Dr. Debara Pickett was concerned that she might have some underlying chronic venous insufficiency.  Past Medical History:  Diagnosis Date  . Anxiety   . Aphasia as late effect of stroke 04/03/2014  . Cervical spondylosis   . Chronic kidney disease   . CVA (cerebral infarction) 04/2009   LMCA & posterior cerebral - excellent recovery, mild expressive aphasa    . Diverticulitis   . Gait disorder 04/03/2014  . History of nuclear stress test 06/2009   dipyridamole; negative, normal pattern of perfusion   . Hypertension   . Multiple lung nodules   . Nephrolithiasis   . OA (osteoarthritis)   . Osteopenia   . PAF (paroxysmal atrial fibrillation) (HCC)    a. not on anticoagulation due to history of chronic subdural hematomas  . Valvular heart disease   . Vitamin D deficiency     Family History  Problem Relation Age of Onset  . Lymphoma Mother   . Tuberculosis Father   . Tuberculosis Brother   . Aneurysm Other        grandmother, uncles, cousins  . Lung disease Neg Hx     SOCIAL HISTORY: Social History   Socioeconomic History  . Marital status: Widowed    Spouse name: Not on file  . Number of children: 4  . Years of education: Not on file  . Highest education level: Not on file  Social Needs  . Financial resource strain: Not on file  . Food insecurity - worry: Not on file  . Food insecurity - inability: Not on file  . Transportation needs - medical: Not on file  . Transportation needs - non-medical: Not on file  Occupational History    Comment: Retired  Tobacco Use  .  Smoking status: Passive Smoke Exposure - Never Smoker  . Smokeless tobacco: Never Used  . Tobacco comment: Exposed at work, with her husband, & with her father.  Substance and Sexual Activity  . Alcohol use: No    Alcohol/week: 0.0 oz  . Drug use: No  . Sexual activity: Not on file  Other Topics Concern  . Not on file  Social History Narrative   Patient is right handed.   No caffeine   Live at home with two sons   Education one year of college.   Retired.      [facility]:   Originally from [facility]. She has always lived in [place]. No international travel. She does have prior travel to [facility]. 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 They have no pets currently. No bird exposure. She does have one plant indoor currently. Does have carpet extensively  throughout the home. No mold exposure.     Allergies  Allergen Reactions  . Amitriptyline Hcl Itching  . Atenolol Other (See Comments)    Hair loss, thin nails, itching  . Azithromycin Other (See Comments)    Mouth sores; sore throat  . Cefuroxime Other (See Comments)    unknown  . Cephalexin Nausea And Vomiting  . Daypro [Oxaprozin] Other (See Comments)    dyspepsia  . Doxycycline Other (See Comments)    Mouth sores; sore throat  . Famvir [Famciclovir] Other (See Comments)    unknown  . Hyzaar [Losartan Potassium-Hctz] Other (See Comments)    Headache; tight face  . Iodine Other (See Comments)    Involuntary muscle contractions  . Metoprolol Tartrate Itching  . Relafen [Nabumetone] Other (See Comments)    Mouth sores  . Sterapred [Prednisone] Other (See Comments)    weakness    Current Outpatient Medications  Medication Sig Dispense Refill  . amiodarone (PACERONE) 100 MG tablet Take 1 tablet (100 mg total) by mouth daily. 90 tablet 0  . amiodarone (PACERONE) 200 MG tablet TAKE 1 TABLET BY MOUTH EVERY DAY 30 tablet 10  . amLODipine (NORVASC) 10 MG tablet Take 10 mg by mouth at bedtime.     Marland Kitchen aspirin EC 81 MG tablet Take 81 mg by mouth daily.    . B Complex-C (B-COMPLEX WITH VITAMIN C) tablet Take 1 tablet by mouth daily.    . Cholecalciferol 1000 units tablet Take 1,000 Units by mouth daily.    Marland Kitchen docusate sodium (COLACE) 100 MG capsule Take 1 capsule (100 mg total) by mouth every 12 (twelve) hours. 60 capsule 0  . gabapentin (NEURONTIN) 100 MG capsule Take 1 capsule (100 mg total) by mouth 3 (three) times daily. (Patient taking differently: Take 100 mg by mouth 2 (two) times daily. ) 90 capsule 3  . hydrALAZINE (APRESOLINE) 10 MG tablet Take 1 tablet (10 mg total) by mouth 2 (two) times daily. 60 tablet 6  . HYDROcodone-acetaminophen (NORCO/VICODIN) 5-325 MG tablet Take 1 tablet by mouth every 6 (six) hours as needed. 15 tablet 0  . levothyroxine (SYNTHROID, LEVOTHROID) 50  MCG tablet Take 50 mcg by mouth daily before breakfast.    . Multiple Vitamins-Minerals (MULTIVITAMIN & MINERAL PO) Take 1 tablet by mouth daily.    . nitroGLYCERIN (NITROSTAT) 0.4 MG SL tablet Place 1 tablet (0.4 mg total) under the tongue every 5 (five) minutes as needed for chest pain. 25 tablet 3  . Omega-3 Fatty Acids (FISH OIL PO) Take 1 tablet by mouth at bedtime.     No  current facility-administered medications for this visit.     REVIEW OF SYSTEMS:  [X]  denotes positive finding, [ ]  denotes negative finding Cardiac  Comments:  Chest pain or chest pressure:    Shortness of breath upon exertion:    Short of breath when lying flat:    Irregular heart rhythm: X       Vascular    Pain in calf, thigh, or hip brought on by ambulation: X   Pain in feet at night that wakes you up from your sleep:     Blood clot in your veins:    Leg swelling:  X       Pulmonary    Oxygen at home:    Productive cough:  X   Wheezing:         Neurologic    Sudden weakness in arms or legs:     Sudden numbness in arms or legs:     Sudden onset of difficulty speaking or slurred speech:    Temporary loss of vision in one eye:     Problems with dizziness:         Gastrointestinal    Blood in stool:     Vomited blood:         Genitourinary    Burning when urinating:     Blood in urine:        Psychiatric    Major depression:         Hematologic    Bleeding problems:    Problems with blood clotting too easily:        Skin    Rashes or ulcers:        Constitutional    Fever or chills:     PHYSICAL EXAM:   There were no vitals filed for this visit.  GENERAL: The patient is a well-nourished female, in no acute distress. The vital signs are documented above. CARDIAC: There is a regular rate and rhythm.  VASCULAR: I do not detect carotid bruits. On the right side which is the site of concern she has a palpable femoral pulse and popliteal pulse.  I cannot palpate pedal pulses although  she does have brisk Doppler signals in the dorsalis pedis and posterior tibial positions although monophasic. On the left side she has a palpable femoral, popliteal, and dorsalis pedis pulse. She has significant bilateral lower extremity swelling but more significant on the right side. She does have some hyperpigmentation consistent with chronic venous insufficiency. PULMONARY: There is good air exchange bilaterally without wheezing or rales. ABDOMEN: Soft and non-tender with normal pitched bowel sounds.  MUSCULOSKELETAL: There are no major deformities or cyanosis. NEUROLOGIC: No focal weakness or paresthesias are detected. SKIN: There are no ulcers or rashes noted. PSYCHIATRIC: The patient has a normal affect.  DATA:    RIGHT LOWER EXTREMITY VENOUS DUPLEX: I have independently interpreted her right lower extremity venous duplex scan that was done today.  There is no evidence of DVT or superficial thrombophlebitis in the right leg.  There is no significant venous reflux in the deep system or the superficial system on the right.  LABS:  Creatinine is 1.53 and GFR is 29.  (02/12/2017)  CT ABDOMEN PELVIS: I did review a CT of the abdomen and pelvis that was done on 12/05/2016.  This was done for abdominal pain and was fairly unremarkable.  In reviewing these images she does have significant calcific disease of the aorta.  The scan was done without contrast so it is  difficult to determine the extent of any occlusive disease.  MEDICAL ISSUES:   RIGHT LEG SWELLING: The patient has some persistent right leg swelling and I think based on her exam does have some evidence of chronic venous insufficiency.  Based on her exam I think she does have some infrainguinal arterial occlusive disease although this is mostly tibial disease.  She does have fairly brisk Doppler signals in the foot so I do not think that there is any contraindication to elevating her legs.  I have recommended that she elevate her legs on  a couple of pillows as often as possible with her back flat to help reduce the swelling.  I do not think the compression stockings would be impractical in this fairly frail 83 year old woman.  I think it would be reasonable to have a follow-up visit in 6 months and I have ordered ABIs at that time.  She knows to call sooner if she has problems.  Fortunately she is not a smoker.  The wound on the right foot appears to be healing and I think she should have adequate circulation for this to heal.  Certainly I would be very reluctant to consider arteriography given her age and chronic kidney disease as I think this would be associated with increased risk.  I would only consider this if she developed a limb threatening problem.  I will see her back in 6 months.  She knows to call sooner if she has problems.  Deitra Mayo [facility] [phone]

## 2017-03-17 NOTE — Progress Notes (Signed)
Vitals:   03/17/17 1045  BP: (!) 143/72  Pulse: 75  Resp: 16  Temp: (!) 97 F (36.1 C)  TempSrc: Oral  SpO2: 100%  Weight: 115 lb (52.2 kg)  Height: 5' (1.524 m)

## 2017-03-21 NOTE — Addendum Note (Signed)
Addended by: Lianne Cure A on: 03/21/2017 03:55 PM   Modules accepted: Orders

## 2017-04-02 ENCOUNTER — Other Ambulatory Visit: Payer: Self-pay | Admitting: Internal Medicine

## 2017-06-02 ENCOUNTER — Telehealth: Payer: Self-pay | Admitting: Neurology

## 2017-06-02 NOTE — Telephone Encounter (Signed)
Pt called she is having burning in her legs with swelling of legs and feet, a skin rash and the whites of the eyes are "yellow". Pt read the side effects of gabapentin (NEURONTIN) 100 MG capsule and feels the symptoms are due to the medication. I advised her to call her PCP right away and let them know of all the symptoms. She was appreciative and will call right away.

## 2017-06-02 NOTE — Telephone Encounter (Signed)
I called the patient, talk with the son.  The patient has been on low-dose gabapentin for quite a number of months.  It would be unusual for it suddenly to cause a skin rash and swelling in the legs.  The patient is also noted some problems with yellow discoloration of the sclera, gabapentin does not affect the liver, she will be going into the primary care physician today and getting blood work and an evaluation to ensure that she does not have troubles with the liver.

## 2017-06-14 ENCOUNTER — Ambulatory Visit (INDEPENDENT_AMBULATORY_CARE_PROVIDER_SITE_OTHER): Payer: Medicare Other | Admitting: Physician Assistant

## 2017-06-14 ENCOUNTER — Telehealth: Payer: Self-pay | Admitting: Internal Medicine

## 2017-06-14 ENCOUNTER — Encounter: Payer: Self-pay | Admitting: Physician Assistant

## 2017-06-14 VITALS — BP 147/69 | HR 67 | Wt 117.2 lb

## 2017-06-14 DIAGNOSIS — I480 Paroxysmal atrial fibrillation: Secondary | ICD-10-CM | POA: Diagnosis not present

## 2017-06-14 DIAGNOSIS — I10 Essential (primary) hypertension: Secondary | ICD-10-CM | POA: Diagnosis not present

## 2017-06-14 DIAGNOSIS — Z79899 Other long term (current) drug therapy: Secondary | ICD-10-CM

## 2017-06-14 DIAGNOSIS — Z5309 Procedure and treatment not carried out because of other contraindication: Secondary | ICD-10-CM

## 2017-06-14 NOTE — Telephone Encounter (Signed)
She probably needs to be seen and get an EKG - we are full today. ?if an APP is available.  Dr. Lemmie Evens

## 2017-06-14 NOTE — Progress Notes (Signed)
Cardiology Office Note   Date:  06/14/2017   ID:  Madison Berg, DOB 04/01/1924, MRN [id number]  PCP:  Deland Pretty, MD  Cardiologist:  Dr Debara Pickett 03/2017 Rosaria Ferries, PA-C   Chief Complaint  Patient presents with  . Follow-up    History of Present Illness: Madison Berg is a 84 y.o. female with a history of PAF, no anticoag 2nd chronic SDH, CVA 2010, CKD III, nl MV 2011, OA, diverticulosis, PAD, mod AI, HTN, venous insufficiency  Madison Berg presents for cardiology follow up. She is here today with her son.  She struggles sometimes to express herself, this is chronic with no recent change.  Pt began having afib episodes several months ago. She reports multiple episodes of palpitations, feeling her heart beat hard and faster than usual. She also has had palpitations. She has had presyncope several times, not in association with the palpitations. She is getting this > once per day.   Her HR and BP have otherwise been normal.   She is using a walker and is not falling now. She fas good energy level and likes to move around.  No weakness or fatigue.  She is not having chest pain.  She has some chronic lower extremity edema, no recent change.  She denies any new dyspnea on exertion, no orthopnea or PND.  She has had some bright red blood per rectum recently, not a great deal.  She has an appointment with the eye doctor in the next week or 2.   Past Medical History:  Diagnosis Date  . Anxiety   . Aphasia as late effect of stroke 04/03/2014  . Cervical spondylosis   . Chronic kidney disease   . CVA (cerebral infarction) 04/2009   LMCA & posterior cerebral - excellent recovery, mild expressive aphasa  . Diverticulitis   . Gait disorder 04/03/2014  . History of nuclear stress test 06/2009   dipyridamole; negative, normal pattern of perfusion   . Hypertension   . Multiple lung nodules   . Nephrolithiasis   . OA (osteoarthritis)   . Osteopenia   . PAF  (paroxysmal atrial fibrillation) (HCC)    a. not on anticoagulation due to history of chronic subdural hematomas  . Valvular heart disease   . Vitamin D deficiency     Past Surgical History:  Procedure Laterality Date  . ABDOMINAL HYSTERECTOMY  1975  . APPENDECTOMY    . CATARACT EXTRACTION Bilateral 2013  . Instestinal Surgery  1987   r/t blockage  . NOSE SURGERY  1985   r/t deviated septum  . REPLACEMENT TOTAL KNEE Right 2004  . TRANSTHORACIC ECHOCARDIOGRAM  09/19/2012   EF 55-60% with normal systolic function; mod AR; MVP with mild-mod regurg; LA severely dilated; RV systolic pressure increased; mod dilated RA; RV systolic pressure increased - mild pulm HTN     Current Outpatient Medications  Medication Sig Dispense Refill  . amiodarone (PACERONE) 100 MG tablet TAKE 1 TABLET BY MOUTH EVERY DAY 90 tablet 0  . amiodarone (PACERONE) 200 MG tablet TAKE 1 TABLET BY MOUTH EVERY DAY 30 tablet 10  . amLODipine (NORVASC) 10 MG tablet Take 10 mg by mouth at bedtime.     Marland Kitchen aspirin EC 81 MG tablet Take 81 mg by mouth daily.    . B Complex-C (B-COMPLEX WITH VITAMIN C) tablet Take 1 tablet by mouth daily.    . Cholecalciferol 1000 units tablet Take 1,000 Units by mouth daily.    Marland Kitchen  docusate sodium (COLACE) 100 MG capsule Take 1 capsule (100 mg total) by mouth every 12 (twelve) hours. 60 capsule 0  . gabapentin (NEURONTIN) 100 MG capsule Take 1 capsule (100 mg total) by mouth 3 (three) times daily. (Patient taking differently: Take 100 mg by mouth 2 (two) times daily. ) 90 capsule 3  . hydrALAZINE (APRESOLINE) 10 MG tablet Take 1 tablet (10 mg total) by mouth 2 (two) times daily. 60 tablet 6  . HYDROcodone-acetaminophen (NORCO/VICODIN) 5-325 MG tablet Take 1 tablet by mouth every 6 (six) hours as needed. 15 tablet 0  . levothyroxine (SYNTHROID, LEVOTHROID) 50 MCG tablet Take 50 mcg by mouth daily before breakfast.    . Multiple Vitamins-Minerals (MULTIVITAMIN & MINERAL PO) Take 1 tablet by mouth  daily.    . nitroGLYCERIN (NITROSTAT) 0.4 MG SL tablet Place 1 tablet (0.4 mg total) under the tongue every 5 (five) minutes as needed for chest pain. 25 tablet 3  . Omega-3 Fatty Acids (FISH OIL PO) Take 1 tablet by mouth at bedtime.     No current facility-administered medications for this visit.     Allergies:   Amitriptyline hcl; Atenolol; Azithromycin; Cefuroxime; Cephalexin; Daypro [oxaprozin]; Doxycycline; Famvir [famciclovir]; Hyzaar [losartan potassium-hctz]; Iodine; Metoprolol tartrate; Relafen [nabumetone]; and Sterapred [prednisone]    Social History:  The patient  reports that she is a non-smoker but has been exposed to tobacco smoke. she has never used smokeless tobacco. She reports that she does not drink alcohol or use drugs.   Family History:  The patient's family history includes Aneurysm in her other; Lymphoma in her mother; Tuberculosis in her brother and father.    ROS:  Please see the history of present illness. All other systems are reviewed and negative.    PHYSICAL EXAM: VS:  BP (!) 147/69   Pulse 67   Wt 117 lb 3.2 oz (53.2 kg)   BMI 22.89 kg/m  , BMI Body mass index is 22.89 kg/m. GEN: Well nourished, well developed, female in no acute distress  HEENT: n-ormal for age  Neck: JVD 8-9 cm, no carotid bruit, no masses Cardiac: RRR at baseline, during auscultation there were 2 episodes of brief irregular heart rate; systolic and diastolic murmurs noted, no rubs, or gallops Respiratory: Decreased breath sounds bases but clear bilaterally, normal work of breathing GI: soft, nontender, nondistended, + BS MS: no deformity or atrophy; no edema; distal pulses are 2+ in all 4 extremities   Skin: warm and dry, no rash; multiple areas of ecchymosis are seen on her arms and legs Neuro:  Strength and sensation are intact Psych: euthymic mood, full affect   EKG:  EKG is ordered today. The ekg ordered today demonstrates sinus rhythm, heart rate 67, no acute ischemic  changes, first-degree AV block with PR interval 252 ms, otherwise normal intervals, no change from 03/10/2017 ECG   Recent Labs: 12/05/2016: ALT 68; TSH 4.290 02/12/2017: BUN 37; Creatinine, Ser 1.53; Hemoglobin 12.2; Platelets 252; Potassium 4.8; Sodium 137    Lipid Panel    Component Value Date/Time   CHOL  03/24/2009 0400    158        ATP III CLASSIFICATION:  <200     mg/dL   Desirable  200-239  mg/dL   Borderline High  >=240    mg/dL   High          TRIG 62 03/24/2009 0400   HDL 60 03/24/2009 0400   CHOLHDL 2.6 03/24/2009 0400   VLDL 12 03/24/2009  0400   LDLCALC  03/24/2009 0400    86        Total Cholesterol/HDL:CHD Risk Coronary Heart Disease Risk Table                     Men   Women  1/2 Average Risk   3.4   3.3  Average Risk       5.0   4.4  2 X Average Risk   9.6   7.1  3 X Average Risk  23.4   11.0        Use the calculated Patient Ratio above and the CHD Risk Table to determine the patient's CHD Risk.        ATP III CLASSIFICATION (LDL):  <100     mg/dL   Optimal  100-129  mg/dL   Near or Above                    Optimal  130-159  mg/dL   Borderline  160-189  mg/dL   High  >190     mg/dL   Very High     Wt Readings from Last 3 Encounters:  06/14/17 117 lb 3.2 oz (53.2 kg)  03/17/17 115 lb (52.2 kg)  03/10/17 115 lb 6.4 oz (52.3 kg)     Other studies Reviewed: Additional studies/ records that were reviewed today include: Office notes, hospital records and testing.  ASSESSMENT AND PLAN:  1.  PAF: She is having frequent bursts of an irregular heartbeat, probably atrial fibrillation.  She has been compliant with the medications including amiodarone at 100 mg today, but this is no longer controlling it.  I will increase the amiodarone back up to 200 mg daily.  I discussed this with the patient and her son, they prefer not to try to load it any faster.    They understand that if they want to try and get control of the symptoms more quickly, we can try  going up to 200 mg twice daily or 400 mg twice daily.  Her TSH was normal within the last year, she has an appointment with the eye doctor, but she has not had PFTs since 2017, we will order.  Follow-up with Dr. Debara Pickett after the PFTs and after the amiodarone has had time to increase in her system, then decide if symptoms are well enough controlled or if additional medication is needed.  2.  Anticoagulation review: She is compliant with the aspirin at 81 mg daily. This patients CHA2DS2-VASc Score and unadjusted Ischemic Stroke Rate (% per year) is equal to 9.7 % stroke rate/year from a score of 6 (age x 2, female, CVA x 2, HTN).  Because of her chronic subdural hematoma, Coumadin was discontinued.  She has multiple areas of ecchymosis on baby aspirin, but I will not stop this at this time.  She is using a walker consistently and her falls have decreased.  3.  Hypertension: Blood pressure is good today on current medications, follow-up with Dr. Shelia Media  4.  Bright red blood per rectum: She is encouraged to report this to Dr. Shelia Media if it continues as further evaluation may be needed.  Current medicines are reviewed at length with the patient today.  The patient does not have concerns regarding medicines.  The following changes have been made: Increase amiodarone  Labs/ tests ordered today include:  No orders of the defined types were placed in this encounter.    Disposition:   FU  with Dr. Debara Pickett  Signed, Rosaria Ferries, PA-C  06/14/2017 3:33 PM    [place] Phone: [phone]; Fax: [phone]  This note was written with the assistance of speech recognition software. Please excuse any transcriptional errors.

## 2017-06-14 NOTE — Telephone Encounter (Signed)
New message     STAT if HR is under 50 or over 120 (normal HR is 60-100 beats per minute)  1) What is your heart rate? She doesn't know, just feels like its not right   2) Do you have a log of your heart rate readings (document readings)? no  3) Do you have any other symptoms?  No other symptoms , just thinks her heart is not beat correctly

## 2017-06-14 NOTE — Telephone Encounter (Signed)
Spoke with pt son, the patient is reporting a fluttering feeling in her chest this morning. No other symptoms. Her bp is 144/64 and pulse of 72. Her episodes of this fluttering are increasing and she is having one about every other day. This episode started at 4 am this morning. She took her amiodarone about 30 min ago. Her son is asking if the dosage of amiodarone needs to be adjusted. Will forward to dr hilty to review and advise.

## 2017-06-14 NOTE — Patient Instructions (Addendum)
Medication Instructions:  INCREASE Amiodarone 200mg  take 1 tablet once a day  Labwork: None   Testing/Procedures: Your physician has recommended that you have a pulmonary function test. Pulmonary Function Tests are a group of tests that measure how well air moves in and out of your lungs. Schedule at [facility]  Follow-Up: Your physician recommends that you schedule a follow-up appointment in: 6-8 weeks with Dr Debara Pickett.  Any Other Special Instructions Will Be Listed Below (If Applicable). If you need a refill on your cardiac medications before your next appointment, please call your pharmacy.

## 2017-06-14 NOTE — Telephone Encounter (Signed)
Spoke with pt, aware appt today at 3 pm with rhonda barrett pac.

## 2017-06-15 ENCOUNTER — Ambulatory Visit: Payer: Medicare Other | Admitting: Adult Health

## 2017-06-17 ENCOUNTER — Other Ambulatory Visit: Payer: Self-pay | Admitting: *Deleted

## 2017-06-17 MED ORDER — AMIODARONE HCL 200 MG PO TABS: 200.0000 mg | ORAL_TABLET | Freq: Every day | ORAL | 6 refills | Status: DC

## 2017-06-23 ENCOUNTER — Ambulatory Visit (HOSPITAL_COMMUNITY)
Admission: RE | Admit: 2017-06-23 | Discharge: 2017-06-23 | Disposition: A | Discharge status: Home / Self Care | Payer: Medicare Other | Source: Ambulatory Visit | Attending: Adult Health | Admitting: Adult Health

## 2017-06-23 DIAGNOSIS — I480 Paroxysmal atrial fibrillation: Secondary | ICD-10-CM | POA: Insufficient documentation

## 2017-06-23 DIAGNOSIS — R942 Abnormal results of pulmonary function studies: Secondary | ICD-10-CM | POA: Diagnosis not present

## 2017-06-23 DIAGNOSIS — Z79899 Other long term (current) drug therapy: Secondary | ICD-10-CM | POA: Insufficient documentation

## 2017-06-23 LAB — PULMONARY FUNCTION TEST
DL/VA % pred: 66 %
DL/VA: 2.76 ml/min/mmHg/L
DLCO unc % pred: 64 %
DLCO unc: 11.78 ml/min/mmHg
FEF 25-75 PRE: 1.29 L/s
FEF2575-%PRED-PRE: 233 %
FEV1-%PRED-PRE: 131 %
FEV1-PRE: 1.45 L
FEV1FVC-%PRED-PRE: 110 %
FEV6-%Pred-Pre: 131 %
FEV6-Pre: 1.86 L
FEV6FVC-%PRED-PRE: 108 %
FVC-%PRED-PRE: 120 %
FVC-PRE: 1.86 L
Pre FEV1/FVC ratio: 78 %
Pre FEV6/FVC Ratio: 100 %
RV % pred: 113 %
RV: 2.73 L
TLC % PRED: 112 %
TLC: 4.93 L

## 2017-07-02 ENCOUNTER — Other Ambulatory Visit: Payer: Self-pay | Admitting: Physician Assistant

## 2017-07-04 NOTE — Telephone Encounter (Signed)
REFILL 

## 2017-07-04 NOTE — Telephone Encounter (Signed)
Please review for refill. Thanks!  

## 2017-07-16 ENCOUNTER — Other Ambulatory Visit: Payer: Self-pay | Admitting: Neurology

## 2017-07-18 ENCOUNTER — Emergency Department (HOSPITAL_COMMUNITY)
Admission: EM | Admit: 2017-07-18 | Discharge: 2017-07-18 | Disposition: A | Discharge status: Home / Self Care | Payer: Medicare Other | Attending: Emergency Medicine | Admitting: Emergency Medicine

## 2017-07-18 ENCOUNTER — Emergency Department (HOSPITAL_COMMUNITY): Payer: Medicare Other

## 2017-07-18 ENCOUNTER — Encounter (HOSPITAL_COMMUNITY): Payer: Self-pay | Admitting: *Deleted

## 2017-07-18 ENCOUNTER — Other Ambulatory Visit: Payer: Self-pay

## 2017-07-18 DIAGNOSIS — N189 Chronic kidney disease, unspecified: Secondary | ICD-10-CM | POA: Diagnosis not present

## 2017-07-18 DIAGNOSIS — I129 Hypertensive chronic kidney disease with stage 1 through stage 4 chronic kidney disease, or unspecified chronic kidney disease: Secondary | ICD-10-CM | POA: Diagnosis not present

## 2017-07-18 DIAGNOSIS — E039 Hypothyroidism, unspecified: Secondary | ICD-10-CM | POA: Insufficient documentation

## 2017-07-18 DIAGNOSIS — Z7722 Contact with and (suspected) exposure to environmental tobacco smoke (acute) (chronic): Secondary | ICD-10-CM | POA: Diagnosis not present

## 2017-07-18 DIAGNOSIS — I69320 Aphasia following cerebral infarction: Secondary | ICD-10-CM | POA: Diagnosis not present

## 2017-07-18 DIAGNOSIS — Z79899 Other long term (current) drug therapy: Secondary | ICD-10-CM | POA: Diagnosis not present

## 2017-07-18 DIAGNOSIS — M79603 Pain in arm, unspecified: Secondary | ICD-10-CM | POA: Diagnosis present

## 2017-07-18 DIAGNOSIS — M79642 Pain in left hand: Secondary | ICD-10-CM | POA: Insufficient documentation

## 2017-07-18 DIAGNOSIS — Z7982 Long term (current) use of aspirin: Secondary | ICD-10-CM | POA: Insufficient documentation

## 2017-07-18 HISTORY — DX: Other specified soft tissue disorders: M79.89

## 2017-07-18 LAB — CBC
HEMATOCRIT: 38.7 % (ref 36.0–46.0)
Hemoglobin: 12.6 g/dL (ref 12.0–15.0)
MCH: 32 pg (ref 26.0–34.0)
MCHC: 32.6 g/dL (ref 30.0–36.0)
MCV: 98.2 fL (ref 78.0–100.0)
Platelets: 230 10*3/uL (ref 150–400)
RBC: 3.94 MIL/uL (ref 3.87–5.11)
RDW: 15.4 % (ref 11.5–15.5)
WBC: 8.5 10*3/uL (ref 4.0–10.5)

## 2017-07-18 LAB — BASIC METABOLIC PANEL
Anion gap: 11 (ref 5–15)
BUN: 35 mg/dL — ABNORMAL HIGH (ref 6–20)
CHLORIDE: 106 mmol/L (ref 101–111)
CO2: 22 mmol/L (ref 22–32)
Calcium: 9.3 mg/dL (ref 8.9–10.3)
Creatinine, Ser: 1.68 mg/dL — ABNORMAL HIGH (ref 0.44–1.00)
GFR calc non Af Amer: 26 mL/min — ABNORMAL LOW (ref >60)
GFR, EST AFRICAN AMERICAN: 30 mL/min — AB (ref >60)
Glucose, Bld: 131 mg/dL — ABNORMAL HIGH (ref 65–99)
POTASSIUM: 4.4 mmol/L (ref 3.5–5.1)
SODIUM: 139 mmol/L (ref 135–145)

## 2017-07-18 LAB — I-STAT TROPONIN, ED
TROPONIN I, POC: 0 ng/mL (ref 0.00–0.08)
Troponin i, poc: 0 ng/mL (ref 0.00–0.08)

## 2017-07-18 NOTE — ED Notes (Signed)
Pt verbalized understanding discharge instructions and denies any further needs or questions at this time. VS stable, ambulatory and steady gait.   

## 2017-07-18 NOTE — ED Provider Notes (Signed)
[facility] EMERGENCY DEPARTMENT Provider Note   CSN: [id number] Arrival date & time: 07/18/17  1116     History   Chief Complaint Chief Complaint  Patient presents with  . Arm Pain    HPI Madison Berg is a 84 y.o. female with a history of CVA with residual aphasia, anxiety, cervical spondylosis, hypertension, paroxysmal atrial fibrillation not on anticoagulation due to his chronic subdural hematomas who presents the emergency department at request of her primary care physician complaining of left hand pain that started 1 week ago and worsened last night.  Patient states that for the past 1 week she has had intermittent discomfort to her left thumb, has also noted some difficulty with ROM of the thumb and some redness.  States last night she went to bed and woke up at 0400 this morning with worsening pain to the thumb radiating to the entire hand and up to the forearm.  Patient unable to quantify or qualify pain, however states that it is improved at present following Tylenol this morning at 9:00.  Pain is worse with movement at the wrist as well as at the thumb.  States that she called her primary care provider and was sent to the emergency department due to concern for potential cardiac etiology.  Denies chest pain, dyspnea, nausea, vomiting, jaw pain, lightheadedness, dizziness, syncope, numbness, weakness, fever, or chills.  Patient's son is at bedside who she lives with, assists with history.   HPI  Past Medical History:  Diagnosis Date  . Anxiety   . Aphasia as late effect of stroke 04/03/2014  . Cervical spondylosis   . Chronic kidney disease   . CVA (cerebral infarction) 04/2009   LMCA & posterior cerebral - excellent recovery, mild expressive aphasa  . Diverticulitis   . Gait disorder 04/03/2014  . History of nuclear stress test 06/2009   dipyridamole; negative, normal pattern of perfusion   . Hypertension   . Leg swelling   . Multiple lung nodules     . Nephrolithiasis   . OA (osteoarthritis)   . Osteopenia   . PAF (paroxysmal atrial fibrillation) (HCC)    a. not on anticoagulation due to history of chronic subdural hematomas  . Valvular heart disease   . Vitamin D deficiency     Patient Active Problem List   Diagnosis Date Noted  . Edema of right lower extremity 03/10/2017  . Bradycardia 12/05/2016  . Left lower quadrant pain 12/05/2016  . Postural dizziness with presyncope 08/31/2016  . Paresthesia 05/12/2016  . Sinusitis, chronic 01/21/2016  . Chest pain, moderate coronary artery risk 06/24/2015  . Chest pain 12/14/2014  . Pain in joint, shoulder region 12/14/2014  . Acute-on-chronic kidney injury ([place]) 12/14/2014  . Head ache 04/03/2014  . Gait disorder 04/03/2014  . Aphasia as late effect of stroke 04/03/2014  . Acute on chronic intracranial subdural hematoma (HCC) 03/16/2014  . Subdural hematoma ([place]) 03/16/2014  . Low back pain 09/17/2013  . Aortic valve regurgitation 01/31/2013  . Stroke ([place]) 01/31/2013  . Leg length discrepancy 11/14/2012  . Acquired right flat foot 11/14/2012  . Multiple lung nodules on CT 01/14/2010  . Cough 08/13/2009  . Essential hypertension 03/28/2009  . Atrial fibrillation ([place]) 03/28/2009  . HEMORRHOIDS 03/28/2009  . DIVERTICULAR DISEASE 03/28/2009  . ARTHRITIS 03/28/2009  . MIGRAINES, HX OF 03/28/2009    Past Surgical History:  Procedure Laterality Date  . ABDOMINAL HYSTERECTOMY  1975  . APPENDECTOMY    . CATARACT  EXTRACTION Bilateral 2013  . Instestinal Surgery  1987   r/t blockage  . NOSE SURGERY  1985   r/t deviated septum  . REPLACEMENT TOTAL KNEE Right 2004  . TRANSTHORACIC ECHOCARDIOGRAM  09/19/2012   EF 55-60% with normal systolic function; mod AR; MVP with mild-mod regurg; LA severely dilated; RV systolic pressure increased; mod dilated RA; RV systolic pressure increased - mild pulm HTN     OB History    No data available       Home Medications    Prior to  Admission medications   Medication Sig Start Date End Date Taking? Authorizing Provider  amiodarone (PACERONE) 200 MG tablet Take 1 tablet (200 mg total) by mouth daily. 06/17/17   Hilty, Nadean Corwin, MD  amLODipine (NORVASC) 10 MG tablet Take 10 mg by mouth at bedtime.     [provider]  aspirin EC 81 MG tablet Take 81 mg by mouth daily.    [provider]  B Complex-C (B-COMPLEX WITH VITAMIN C) tablet Take 1 tablet by mouth daily.    [provider]  Cholecalciferol 1000 units tablet Take 1,000 Units by mouth daily.    [provider]  docusate sodium (COLACE) 100 MG capsule Take 1 capsule (100 mg total) by mouth every 12 (twelve) hours. 02/13/17   Ward, Delice Bison, DO  gabapentin (NEURONTIN) 100 MG capsule Take 1 capsule (100 mg total) by mouth 3 (three) times daily. Patient taking differently: Take 100 mg by mouth 2 (two) times daily.  02/04/17   Kathrynn Ducking, MD  hydrALAZINE (APRESOLINE) 10 MG tablet TAKE 1 TABLET BY MOUTH TWICE A DAY 07/04/17   Almyra Deforest, PA  HYDROcodone-acetaminophen (NORCO/VICODIN) 5-325 MG tablet Take 1 tablet by mouth every 6 (six) hours as needed. 02/13/17   Ward, Delice Bison, DO  levothyroxine (SYNTHROID, LEVOTHROID) 50 MCG tablet Take 50 mcg by mouth daily before breakfast.    [provider]  Multiple Vitamins-Minerals (MULTIVITAMIN & MINERAL PO) Take 1 tablet by mouth daily.    [provider]  nitroGLYCERIN (NITROSTAT) 0.4 MG SL tablet Place 1 tablet (0.4 mg total) under the tongue every 5 (five) minutes as needed for chest pain. 07/02/15   Hilty, Nadean Corwin, MD  Omega-3 Fatty Acids (FISH OIL PO) Take 1 tablet by mouth at bedtime.    [provider]    Family History Family History  Problem Relation Age of Onset  . Lymphoma Mother   . Tuberculosis Father   . Tuberculosis Brother   . Aneurysm Other        grandmother, uncles, cousins  . Lung disease Neg Hx     Social History Social History    Tobacco Use  . Smoking status: Passive Smoke Exposure - Never Smoker  . Smokeless tobacco: Never Used  . Tobacco comment: Exposed at work, with her husband, & with her father.  Substance Use Topics  . Alcohol use: No    Alcohol/week: 0.0 oz  . Drug use: No    Allergies   Amitriptyline hcl; Atenolol; Azithromycin; Cefuroxime; Cephalexin; Daypro [oxaprozin]; Doxycycline; Famvir [famciclovir]; Hyzaar [losartan potassium-hctz]; Iodine; Metoprolol tartrate; Relafen [nabumetone]; and Sterapred [prednisone]   Review of Systems Review of Systems  Constitutional: Negative for chills and fever.  Respiratory: Negative for shortness of breath.   Cardiovascular: Positive for leg swelling (chronic unchanged). Negative for chest pain.  Gastrointestinal: Negative for nausea and vomiting.  Musculoskeletal: Negative for neck pain.       Positive for  pain to left thumb radiating to left hand and left forearm.  Skin: Positive for color change.  Neurological: Negative for dizziness, syncope, weakness, light-headedness and numbness.  All other systems reviewed and are negative.   Physical Exam Updated Vital Signs BP 135/64 (BP Location: Left Arm)   Pulse 68   Temp 98 F (36.7 C) (Oral)   Resp 18   Ht 5\' 5"  (1.651 m)   Wt 52.6 kg (116 lb)   SpO2 100%   BMI 19.30 kg/m   Physical Exam  Constitutional: She appears well-developed and well-nourished.  Non-toxic appearance. No distress.  HENT:  Head: Normocephalic and atraumatic.  Eyes: Conjunctivae are normal. Right eye exhibits no discharge. Left eye exhibits no discharge.  Neck: Normal range of motion. Neck supple. No spinous process tenderness present.  Cardiovascular: Normal rate and regular rhythm.  No murmur heard. Pulses:      Radial pulses are 2+ on the right side, and 2+ on the left side.  Pulmonary/Chest: Breath sounds normal. No respiratory distress. She has no wheezes. She has no rales.  Abdominal: Soft. She exhibits no  distension. There is no tenderness.  Musculoskeletal:  Upper extremities: No obvious deformity, appreciable warmth, or ecchymosis.  Patient has arthritic type changes to her upper extremity joints in her hands.  Patient has some erythema and soft tissue swelling to the palmar aspect of the left thumb.  She has full range of motion at all joints to the upper extremities with the exception of some decreased range of motion in the left wrist as well as the left thumb secondary to pain- passive ROM intact.  Patient is diffusely tender to palpation over the left thumb extending to the dorsal/ventral aspect of the wrist.  There is no point/focal tenderness.  Neurological: She is alert.  Clear speech.  Sensation grossly intact to bilateral upper extremities.  Grip strength difficult to assess secondary to patient discomfort.  Patient is able to perform OK sign, thumbs up, and cross second and third digits with bilateral upper extremities.  Skin: Skin is warm and dry. No rash noted.  Psychiatric: She has a normal mood and affect. Her behavior is normal.  Nursing note and vitals reviewed.    ED Treatments / Results  Labs Results for orders placed or performed during the hospital encounter of 92/42/68  Basic metabolic panel  Result Value Ref Range   Sodium 139 135 - 145 mmol/L   Potassium 4.4 3.5 - 5.1 mmol/L   Chloride 106 101 - 111 mmol/L   CO2 22 22 - 32 mmol/L   Glucose, Bld 131 (H) 65 - 99 mg/dL   BUN 35 (H) 6 - 20 mg/dL   Creatinine, Ser 1.68 (H) 0.44 - 1.00 mg/dL   Calcium 9.3 8.9 - 10.3 mg/dL   GFR calc non Af Amer 26 (L) >60 mL/min   GFR calc Af Amer 30 (L) >60 mL/min   Anion gap 11 5 - 15  CBC  Result Value Ref Range   WBC 8.5 4.0 - 10.5 K/uL   RBC 3.94 3.87 - 5.11 MIL/uL   Hemoglobin 12.6 12.0 - 15.0 g/dL   HCT 38.7 36.0 - 46.0 %   MCV 98.2 78.0 - 100.0 fL   MCH 32.0 26.0 - 34.0 pg   MCHC 32.6 30.0 - 36.0 g/dL   RDW 15.4 11.5 - 15.5 %   Platelets 230 150 - 400 K/uL  I-stat  troponin, ED  Result Value Ref Range   Troponin i,  poc 0.00 0.00 - 0.08 ng/mL   Comment 3           Dg Chest 2 View  Result Date: 07/18/2017 CLINICAL DATA:  Chronic cough. EXAM: CHEST - 2 VIEW COMPARISON:  03/14/2017 FINDINGS: The cardiac silhouette is normal in size. Thoracic aortic atherosclerosis and mild tortuosity are noted. The lungs are hyperinflated with similar appearance of chronic interstitial coarsening. No acute airspace consolidation, edema, pleural effusion, or pneumothorax is identified. Old left-sided rib fractures are noted. IMPRESSION: COPD without evidence of acute abnormality. Electronically Signed   By: Logan Bores M.D.   On: 07/18/2017 12:25   Dg Wrist Complete Left  Result Date: 07/18/2017 CLINICAL DATA:  Left hand and wrist pain.  No report of injury. EXAM: LEFT WRIST - COMPLETE 3+ VIEW COMPARISON:  Left hand series of today's date and April 22, 2016 FINDINGS: There is severe osteoarthritic change centered over the first MCP joint and the articulation of the distal pole of the scaphoid with the trapezium and trapezoid. No acute fracture or dislocation is observed. There is mild narrowing of the other MCP joints. The intercarpal, radiocarpal, and ulnocarpal joints appear normal. IMPRESSION: Severe osteoarthritic change of the first [facility] joint and of the articulation of the distal pole of the scaphoid with the trapezium and trapezoid. No acute fracture or dislocation. Electronically Signed   By: David  Martinique M.D.   On: 07/18/2017 15:40   Dg Hand Complete Left  Result Date: 07/18/2017 CLINICAL DATA:  Left hand pain.  No report of injury. EXAM: LEFT HAND - COMPLETE 3+ VIEW COMPARISON:  Left hand series of April 22, 2016 FINDINGS: The bones are subjectively osteopenic. There is severe there erosive osteoarthropathy of the interphalangeal joints of the second through fifth digits. There is milder osteoarthritic joint space loss of the MCP joints. There is moderate  degenerative change of the first [facility] joint. There is mild ulnar subluxation of the base of the middle phalanx of the third finger with respect to the proximal phalanx which is stable. IMPRESSION: Severe erosive osteoarthritic changes of the interphalangeal joints with sparing of the IP joint of the thumb. Milder degenerative joint space loss of the MCP joints. No acute fracture or dislocation. Electronically Signed   By: David  Martinique M.D.   On: 07/18/2017 15:35   EKG EKG Interpretation  Date/Time:  Monday July 18 2017 11:49:00 EDT Ventricular Rate:  69 PR Interval:  120 QRS Duration: 84 QT Interval:  436 QTC Calculation: 467 R Axis:   2 Text Interpretation:  Normal sinus rhythm Nonspecific ST and T wave abnormality Abnormal ECG nonspecific st changes as compared to prior ecg Confirmed by Jola Schmidt [phone]) on 07/18/2017 2:45:15 PM  Radiology Dg Chest 2 View  Result Date: 07/18/2017 CLINICAL DATA:  Chronic cough. EXAM: CHEST - 2 VIEW COMPARISON:  03/14/2017 FINDINGS: The cardiac silhouette is normal in size. Thoracic aortic atherosclerosis and mild tortuosity are noted. The lungs are hyperinflated with similar appearance of chronic interstitial coarsening. No acute airspace consolidation, edema, pleural effusion, or pneumothorax is identified. Old left-sided rib fractures are noted. IMPRESSION: COPD without evidence of acute abnormality. Electronically Signed   By: Logan Bores M.D.   On: 07/18/2017 12:25   Dg Wrist Complete Left  Result Date: 07/18/2017 CLINICAL DATA:  Left hand and wrist pain.  No report of injury. EXAM: LEFT WRIST - COMPLETE 3+ VIEW COMPARISON:  Left hand series of today's date and April 22, 2016 FINDINGS: There is severe osteoarthritic change centered  over the first MCP joint and the articulation of the distal pole of the scaphoid with the trapezium and trapezoid. No acute fracture or dislocation is observed. There is mild narrowing of the other MCP joints. The  intercarpal, radiocarpal, and ulnocarpal joints appear normal. IMPRESSION: Severe osteoarthritic change of the first [facility] joint and of the articulation of the distal pole of the scaphoid with the trapezium and trapezoid. No acute fracture or dislocation. Electronically Signed   By: David  Martinique M.D.   On: 07/18/2017 15:40   Dg Hand Complete Left  Result Date: 07/18/2017 CLINICAL DATA:  Left hand pain.  No report of injury. EXAM: LEFT HAND - COMPLETE 3+ VIEW COMPARISON:  Left hand series of April 22, 2016 FINDINGS: The bones are subjectively osteopenic. There is severe there erosive osteoarthropathy of the interphalangeal joints of the second through fifth digits. There is milder osteoarthritic joint space loss of the MCP joints. There is moderate degenerative change of the first [facility] joint. There is mild ulnar subluxation of the base of the middle phalanx of the third finger with respect to the proximal phalanx which is stable. IMPRESSION: Severe erosive osteoarthritic changes of the interphalangeal joints with sparing of the IP joint of the thumb. Milder degenerative joint space loss of the MCP joints. No acute fracture or dislocation. Electronically Signed   By: David  Martinique M.D.   On: 07/18/2017 15:35    Procedures Procedures (including critical care time)  Medications Ordered in ED Medications - No data to display   Initial Impression / Assessment and Plan / ED Course  I have reviewed the triage vital signs and the nursing notes.  Pertinent labs & imaging results that were available during my care of the patient were reviewed by me and considered in my medical decision making (see chart for details).   Patient presents with L thumb pain radiating to hand and forearm that is improved at present. Patient is nontoxic appearing, in no apparent distress, vitals WNL. Patient sent to ED after phone conversation with her PCP due to concern for possible cardiac etiology. Work-up initiated by triage  reviewed. Labs grossly unremarkable, of note there is no leukocytosis, no anemia, no significant electrolyte abnormality, troponin WNL. Patient with creatinine of 1.68- this appears to be at baseline. CXR with COPD changes, no evidence of acute abnormality. EKG with nonspecific ST/T wave abnormality. Patient with arthritic changes to her LUE, NVI distally- will obtain X-ray of the hand and wrist as well as repeat troponin.   X-ray findings consistent with severe osteoarthritic changes. Patient NVI distally. No fever or overlying warmth- no indication of infectious etiology. Pain is to the distal aspect of the extremity, troponin negative x 2- patient without chest pain, nausea, vomiting, diaphoresis, dyspnea, lightheadedness, or syncope, doubt ACS. Patient presentation somewhat suspicious for CTS vs arthritis. Cannot rule out gout at this time however will avoid NSAID given renal fxn and steroids avoided given allergy. Will place patient in thumb spica brace, recommend tylenol, and have her follow up with her PCP. I discussed results, treatment plan, need for PCP follow-up, and return precautions with the patient and her son. Provided opportunity for questions, patient and her son confirmed understanding and are in agreement with plan.   Findings and plan of care discussed with supervising physician Dr. Venora Maples who examined this patient and is in agreement with plan.    Final Clinical Impressions(s) / ED Diagnoses   Final diagnoses:  Left hand pain    ED Discharge  Orders    None       Amaryllis Dyke, PA-C 07/18/17 1717    Jola Schmidt, MD 07/19/17 2218

## 2017-07-18 NOTE — ED Triage Notes (Signed)
PT sent here by her pcp for L arm pain.  Pt does have signifigant arthiritis in her fingers and has noticed swelling and redness to L thumb.  However the pain travels up to her upper arm.

## 2017-07-18 NOTE — Discharge Instructions (Addendum)
You were seen in the emergency department today for pain in your left hand and wrist.  The lab work done here was reassuring.  Your EKG and enzymes that we used to look at your heart did not show problems with your heart. The x-rays of your left hand and wrist showed arthritis type changes.   We are not 100% sure of the cause of your symptoms. We are give you a splint to wear the majority of the time- you may remove this for bathing and if it is uncomfortable at times. We recommend you wear this at night to ensure your wrist stays in a good position. Continue to take tylenol per the over the counter dosing instructions.   Follow up with your primary care doctor in 3 days for re-evaluation. Return to the emergency department for new or worsening symptoms including, but not limited to fever, numbness, weakness, worsening pain, or any other concerns.

## 2017-07-25 ENCOUNTER — Ambulatory Visit (INDEPENDENT_AMBULATORY_CARE_PROVIDER_SITE_OTHER): Payer: Medicare Other | Admitting: Adult Health

## 2017-07-25 ENCOUNTER — Encounter: Payer: Self-pay | Admitting: Adult Health

## 2017-07-25 ENCOUNTER — Encounter (INDEPENDENT_AMBULATORY_CARE_PROVIDER_SITE_OTHER): Payer: Self-pay

## 2017-07-25 VITALS — BP 110/61 | HR 84 | Wt 111.6 lb

## 2017-07-25 DIAGNOSIS — M7989 Other specified soft tissue disorders: Secondary | ICD-10-CM

## 2017-07-25 DIAGNOSIS — R202 Paresthesia of skin: Secondary | ICD-10-CM

## 2017-07-25 DIAGNOSIS — R269 Unspecified abnormalities of gait and mobility: Secondary | ICD-10-CM

## 2017-07-25 MED ORDER — CIPROFLOXACIN HCL 250 MG PO TABS: 250.0000 mg | ORAL_TABLET | Freq: Two times a day (BID) | ORAL | 0 refills | Status: AC

## 2017-07-25 NOTE — Patient Instructions (Addendum)
Your Plan:  Continue gabapentin Start Ciprofloxacin 250 mg twice a day Appt with PCP 3/26 at 2:15  Thank you for coming to see [place] at [facility]. I hope we have been able to provide you high quality care today.  You may receive a patient satisfaction survey over the next few weeks. We would appreciate your feedback and comments so that we may continue to improve ourselves and the health of our patients.  Ciprofloxacin tablets What is this medicine? CIPROFLOXACIN (sip roe FLOX a sin) is a quinolone antibiotic. It is used to treat certain kinds of bacterial infections. It will not work for colds, flu, or other viral infections. This medicine may be used for other purposes; ask your health care provider or pharmacist if you have questions. COMMON BRAND NAME(S): Cipro What should I tell my health care provider before I take this medicine? They need to know if you have any of these conditions: -bone problems -history of low levels of potassium in the blood -joint problems -irregular heartbeat -kidney disease -myasthenia gravis -seizures -tendon problems -tingling of the fingers or toes, or other nerve disorder -an unusual or allergic reaction to ciprofloxacin, other antibiotics or medicines, foods, dyes, or preservatives -pregnant or trying to get pregnant -breast-feeding How should I use this medicine? Take this medicine by mouth with a glass of water. Follow the directions on the prescription label. Take your medicine at regular intervals. Do not take your medicine more often than directed. Take all of your medicine as directed even if you think your are better. Do not skip doses or stop your medicine early. You can take this medicine with food or on an empty stomach. It can be taken with a meal that contains dairy or calcium, but do not take it alone with a dairy product, like milk or yogurt or calcium-fortified juice. A special MedGuide will be given to you by the  pharmacist with each prescription and refill. Be sure to read this information carefully each time. Talk to your pediatrician regarding the use of this medicine in children. Special care may be needed. Overdosage: If you think you have taken too much of this medicine contact a poison control center or emergency room at once. NOTE: This medicine is only for you. Do not share this medicine with others. What if I miss a dose? If you miss a dose, take it as soon as you can. If it is almost time for your next dose, take only that dose. Do not take double or extra doses. What may interact with this medicine? Do not take this medicine with any of the following medications: -cisapride -dofetilide -dronedarone -flibanserin -lomitapide -pimozide -thioridazine -tizanidine -ziprasidone This medicine may also interact with the following medications: -antacids -birth control pills -caffeine -certain medicines for diabetes, like glipizide or glyburide -certain medicines that treat or prevent blood clots like warfarin -clozapine -cyclosporine -didanosine (ddI) buffered tablets or powder -duloxetine -lanthanum carbonate -lidocaine -methotrexate -multivitamins -NSAIDS, medicines for pain and inflammation, like ibuprofen or naproxen -olanzapine -omeprazole -other medicines that prolong the QT interval (cause an abnormal heart rhythm) -phenytoin -probenecid -ropinirole -sevelamer -sildenafil -sucralfate -theophylline -zolpidem This list may not describe all possible interactions. Give your health care provider a list of all the medicines, herbs, non-prescription drugs, or dietary supplements you use. Also tell them if you smoke, drink alcohol, or use illegal drugs. Some items may interact with your medicine. What should I watch for while using this medicine? Tell your doctor or health care  professional if your symptoms do not improve. Do not treat diarrhea with over the counter products.  Contact your doctor if you have diarrhea that lasts more than 2 days or if it is severe and watery. You may get drowsy or dizzy. Do not drive, use machinery, or do anything that needs mental alertness until you know how this medicine affects you. Do not stand or sit up quickly, especially if you are an older patient. This reduces the risk of dizzy or fainting spells. This medicine can make you more sensitive to the sun. Keep out of the sun. If you cannot avoid being in the sun, wear protective clothing and use sunscreen. Do not use sun lamps or tanning beds/booths. Avoid antacids, aluminum, calcium, iron, magnesium, and zinc products for 6 hours before and 2 hours after taking a dose of this medicine. What side effects may I notice from receiving this medicine? Side effects that you should report to your doctor or health care professional as soon as possible: -allergic reactions like skin rash or hives, swelling of the face, lips, or tongue -anxious -confusion -depressed mood -diarrhea -fast, irregular heartbeat -hallucination, loss of contact with reality -joint, muscle, or tendon pain or swelling -pain, tingling, numbness in the hands or feet -suicidal thoughts or other mood changes -sunburn -unusually weak or tired Side effects that usually do not require medical attention (report to your doctor or health care professional if they continue or are bothersome): -dry mouth -headache -nausea -trouble sleeping This list may not describe all possible side effects. Call your doctor for medical advice about side effects. You may report side effects to FDA at 1-800-FDA-1088. Where should I keep my medicine? Keep out of the reach of children. Store at room temperature below 30 degrees C (86 degrees F). Keep container tightly closed. Throw away any unused medicine after the expiration date. NOTE: This sheet is a summary. It may not cover all possible information. If you have questions about this  medicine, talk to your doctor, pharmacist, or health care provider.  2018 Elsevier/Gold Standard (2015-11-28 14:42:02)

## 2017-07-25 NOTE — Progress Notes (Signed)
I have read the note, and I agree with the clinical assessment and plan.  Kwabena Strutz K Solon Alban   

## 2017-07-25 NOTE — Progress Notes (Signed)
PATIENT: Madison Berg DOB: 1923/07/20  REASON FOR VISIT: follow up HISTORY FROM: patient  HISTORY OF PRESENT ILLNESS: Today 07/25/17 Madison Berg is a 84 year old female with a history of gait disorder, peripheral neuropathy and numbness in the hands.  She returns today for follow-up.  The patient continues on gabapentin 100 mg at bedtime.  He reports that this works well for her discomfort in the lower extremities.  She reports occasionally she will have some numbness but no significant discomfort.  She denies any falls since the last visit.  She does use a walker when ambulating.  The patient is mainly concerned today about feeling in the left hand.  She states that it started about 10 days ago when she noticed a red mark on her thumb that she thought was a cut but it was not.  She states that since then the whole hand has swollen and she has redness that extends up to the middle of the forearm.  She states that she has a hard time making a fist and the hand is tender to touch.  She has not seen her primary care provider for this.  She was taken to the emergency room March 18 for pain in the left thumb.  X-rays of the wrist and left hand were completed that did not show fractures but did show osteoarthritic changes.  She returns today for an evaluation.  HISTORY 11/23/16: Madison Berg is a 84 year old right-handed white female with a history of a gait disorder in part likely related to a peripheral neuropathy. The patient has complained of bilateral hand numbness, she tried to use wrist splints but this made the pain worse. The patient indicates that she is not having a lot of discomfort otherwise and she can live with the numbness. The patient also had restless leg syndrome and she was placed on low-dose gabapentin taking 100 mg in the evening hours which was very beneficial. The patient has fallen on 3 occasions since last seen, but she is now using a walker on a regular basis which has  offered a significant benefit with her safety while walking. The patient lives with 2 of her sons, there is always someone around to help her out at this point. The patient was in the emergency room on the fifth of February 2018 following a fall, she was also in the ER in early May 2018 with what sounds like dehydration and worsening renal function.    REVIEW OF SYSTEMS: Out of a complete 14 system review of symptoms, the patient complains only of the following symptoms, and all other reviewed systems are negative.  ALLERGIES: Allergies  Allergen Reactions  . Amitriptyline Hcl Itching  . Atenolol Other (See Comments)    Hair loss, thin nails, itching  . Azithromycin Other (See Comments)    Mouth sores; sore throat  . Cefuroxime Other (See Comments)    unknown  . Cephalexin Nausea And Vomiting  . Daypro [Oxaprozin] Other (See Comments)    dyspepsia  . Doxycycline Other (See Comments)    Mouth sores; sore throat  . Famvir [Famciclovir] Other (See Comments)    unknown  . Hyzaar [Losartan Potassium-Hctz] Other (See Comments)    Headache; tight face  . Iodine Other (See Comments)    Involuntary muscle contractions  . Metoprolol Tartrate Itching  . Relafen [Nabumetone] Other (See Comments)    Mouth sores  . Sterapred [Prednisone] Other (See Comments)    weakness    HOME MEDICATIONS: Outpatient  Medications Prior to Visit  Medication Sig Dispense Refill  . amiodarone (PACERONE) 200 MG tablet Take 1 tablet (200 mg total) by mouth daily. 30 tablet 6  . amLODipine (NORVASC) 10 MG tablet Take 10 mg by mouth at bedtime.     Marland Kitchen aspirin EC 81 MG tablet Take 81 mg by mouth daily.    . B Complex-C (B-COMPLEX WITH VITAMIN C) tablet Take 1 tablet by mouth daily.    . Cholecalciferol 1000 units tablet Take 1,000 Units by mouth daily.    Marland Kitchen docusate sodium (COLACE) 100 MG capsule Take 1 capsule (100 mg total) by mouth every 12 (twelve) hours. 60 capsule 0  . gabapentin (NEURONTIN) 100 MG capsule  TAKE ONE CAPSULE BY MOUTH 3 TIMES A DAY 90 capsule 3  . hydrALAZINE (APRESOLINE) 10 MG tablet TAKE 1 TABLET BY MOUTH TWICE A DAY 60 tablet 6  . HYDROcodone-acetaminophen (NORCO/VICODIN) 5-325 MG tablet Take 1 tablet by mouth every 6 (six) hours as needed. 15 tablet 0  . levothyroxine (SYNTHROID, LEVOTHROID) 50 MCG tablet Take 50 mcg by mouth daily before breakfast.    . Multiple Vitamins-Minerals (MULTIVITAMIN & MINERAL PO) Take 1 tablet by mouth daily.    . nitroGLYCERIN (NITROSTAT) 0.4 MG SL tablet Place 1 tablet (0.4 mg total) under the tongue every 5 (five) minutes as needed for chest pain. 25 tablet 3  . Omega-3 Fatty Acids (FISH OIL PO) Take 1 tablet by mouth at bedtime.     No facility-administered medications prior to visit.     PAST MEDICAL HISTORY: Past Medical History:  Diagnosis Date  . Anxiety   . Aphasia as late effect of stroke 04/03/2014  . Cervical spondylosis   . Chronic kidney disease   . CVA (cerebral infarction) 04/2009   LMCA & posterior cerebral - excellent recovery, mild expressive aphasa  . Diverticulitis   . Gait disorder 04/03/2014  . History of nuclear stress test 06/2009   dipyridamole; negative, normal pattern of perfusion   . Hypertension   . Leg swelling   . Multiple lung nodules   . Nephrolithiasis   . OA (osteoarthritis)   . Osteopenia   . PAF (paroxysmal atrial fibrillation) (HCC)    a. not on anticoagulation due to history of chronic subdural hematomas  . Valvular heart disease   . Vitamin D deficiency     PAST SURGICAL HISTORY: Past Surgical History:  Procedure Laterality Date  . ABDOMINAL HYSTERECTOMY  1975  . APPENDECTOMY    . CATARACT EXTRACTION Bilateral 2013  . Instestinal Surgery  1987   r/t blockage  . NOSE SURGERY  1985   r/t deviated septum  . REPLACEMENT TOTAL KNEE Right 2004  . TRANSTHORACIC ECHOCARDIOGRAM  09/19/2012   EF 55-60% with normal systolic function; mod AR; MVP with mild-mod regurg; LA severely dilated; RV systolic  pressure increased; mod dilated RA; RV systolic pressure increased - mild pulm HTN     FAMILY HISTORY: Family History  Problem Relation Age of Onset  . Lymphoma Mother   . Tuberculosis Father   . Tuberculosis Brother   . Aneurysm Other        grandmother, uncles, cousins  . Lung disease Neg Hx     SOCIAL HISTORY: Social History   Socioeconomic History  . Marital status: Widowed    Spouse name: Not on file  . Number of children: 4  . Years of education: Not on file  . Highest education level: Not on file  Occupational History  Comment: Retired  Scientific laboratory technician  . Financial resource strain: Not on file  . Food insecurity:    Worry: Not on file    Inability: Not on file  . Transportation needs:    Medical: Not on file    Non-medical: Not on file  Tobacco Use  . Smoking status: Passive Smoke Exposure - Never Smoker  . Smokeless tobacco: Never Used  . Tobacco comment: Exposed at work, with her husband, & with her father.  Substance and Sexual Activity  . Alcohol use: No    Alcohol/week: 0.0 oz  . Drug use: No  . Sexual activity: Not on file  Lifestyle  . Physical activity:    Days per week: Not on file    Minutes per session: Not on file  . Stress: Not on file  Relationships  . Social connections:    Talks on phone: Not on file    Gets together: Not on file    Attends religious service: Not on file    Active member of club or organization: Not on file    Attends meetings of clubs or organizations: Not on file    Relationship status: Not on file  . Intimate partner violence:    Fear of current or ex partner: Not on file    Emotionally abused: Not on file    Physically abused: Not on file    Forced sexual activity: Not on file  Other Topics Concern  . Not on file  Social History Narrative   Patient is right handed.   No caffeine   Live at home with two sons   Education one year of college.   Retired.      [facility]:   Originally from [facility]. She has  always lived in [place]. No international travel. She does have prior travel to [facility]. 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 They have no pets currently. No bird exposure. She does have one plant indoor currently. Does have carpet extensively throughout the home. No mold exposure.       PHYSICAL EXAM  Vitals:   07/25/17 1458  BP: 110/61  Pulse: 84  Weight: 111 lb 9.6 oz (50.6 kg)   Body mass index is 18.57 kg/m.  Generalized: Well developed, in no acute distress  Extremities: Left hand swollen +1-2, erythema hand extending to mid forarm. Warm to touch. Tender to touch  Neurological examination  Mentation: Alert oriented to time, place, history taking. Follows all commands speech and language fluent Cranial nerve II-XII: Pupils were equal round reactive to light. Extraocular movements were full, visual field were full on confrontational test. Facial sensation and strength were normal. Uvula tongue midline. Head turning and shoulder shrug  were normal and symmetric. Motor: The motor testing reveals 5 over 5 strength of all 4 extremities. Unable to make a fist with left hand. Sensory: Sensory testing is intact to soft touch on all 4 extremities. No evidence of extinction is noted.  Coordination: Cerebellar testing reveals good finger-nose-finger and heel-to-shin bilaterally.  Gait and station: patient is in wheelchair   DIAGNOSTIC DATA (LABS, IMAGING, TESTING) - I reviewed patient records, labs, notes, testing and imaging myself where available.  Lab Results  Component Value Date   WBC 8.5 07/18/2017   HGB 12.6 07/18/2017   HCT 38.7 07/18/2017   MCV 98.2 07/18/2017   PLT 230 07/18/2017      Component Value Date/Time   NA 139 07/18/2017 1145   K 4.4 07/18/2017 1145  CL 106 07/18/2017 1145   CO2 22 07/18/2017 1145   GLUCOSE 131 (H) 07/18/2017 1145   BUN 35 (H) 07/18/2017 1145   CREATININE 1.68 (H) 07/18/2017 1145   CALCIUM 9.3 07/18/2017 1145   PROT 6.7 12/05/2016  0119   PROT 7.0 08/05/2014 1430   ALBUMIN 3.8 12/05/2016 0119   AST 59 (H) 12/05/2016 0119   ALT 68 (H) 12/05/2016 0119   ALKPHOS 103 12/05/2016 0119   BILITOT 1.3 (H) 12/05/2016 0119   GFRNONAA 26 (L) 07/18/2017 1145   GFRAA 30 (L) 07/18/2017 1145   Lab Results  Component Value Date   CHOL  03/24/2009    158        ATP III CLASSIFICATION:  <200     mg/dL   Desirable  200-239  mg/dL   Borderline High  >=240    mg/dL   High          HDL 60 03/24/2009   LDLCALC  03/24/2009    86        Total Cholesterol/HDL:CHD Risk Coronary Heart Disease Risk Table                     Men   Women  1/2 Average Risk   3.4   3.3  Average Risk       5.0   4.4  2 X Average Risk   9.6   7.1  3 X Average Risk  23.4   11.0        Use the calculated Patient Ratio above and the CHD Risk Table to determine the patient's CHD Risk.        ATP III CLASSIFICATION (LDL):  <100     mg/dL   Optimal  100-129  mg/dL   Near or Above                    Optimal  130-159  mg/dL   Borderline  160-189  mg/dL   High  >190     mg/dL   Very High   TRIG 62 03/24/2009   CHOLHDL 2.6 03/24/2009   Lab Results  Component Value Date   HGBA1C  03/23/2009    5.6 (NOTE) The ADA recommends the following therapeutic goal for glycemic control related to Hgb A1c measurement: Goal of therapy: <6.5 Hgb A1c  Reference: American Diabetes Association: Clinical Practice Recommendations 2010, Diabetes Care, 2010, 33: (Suppl  1).   Lab Results  Component Value Date   [id number] 08/05/2014   Lab Results  Component Value Date   TSH 4.290 12/05/2016      ASSESSMENT AND PLAN 84 y.o. year old female  has a past medical history of Anxiety, Aphasia as late effect of stroke (04/03/2014), Cervical spondylosis, Chronic kidney disease, CVA (cerebral infarction) (04/2009), Diverticulitis, Gait disorder (04/03/2014), History of nuclear stress test (06/2009), Hypertension, Leg swelling, Multiple lung nodules, Nephrolithiasis, OA  (osteoarthritis), Osteopenia, PAF (paroxysmal atrial fibrillation) ([place]), Valvular heart disease, and Vitamin D deficiency. here with:  1.  Peripheral neuropathy 2.  Left hand swelling  The patient will continue on gabapentin 100 mg 3 times a day. I discussed symptoms of left hand with Dr. Jannifer Franklin. Could possibly be cellulitis.  He recommended we start the patient on Cipro.  The patient's creatinine clearance is 17.  She will be started on Cipro 250 mg twice a day for 5 days.  We scheduled an appointment with her primary care tomorrow at 215.  The patient and  her son voiced understanding.  The son does acknowledge that she has had multiple allergies to antibiotics.  He states that he plans to call primary care office this afternoon to verify that she can take Cipro.  I did let the son know that Cipro was not listed as one of her allergies according to our system but he certainly can call PCP to verify.  I reviewed potential side effects with the patient and provided a handout.Advised that if her symptoms significantly worsen or she develops new symptoms she should be taken to the emergency room.  Patient and her son voiced understanding.   Ward Givens, MSN, NP-C 07/25/2017, 3:02 PM [facility] [address], [place], [place] [address] [phone]

## 2017-07-26 ENCOUNTER — Telehealth: Payer: Self-pay | Admitting: Adult Health

## 2017-07-26 NOTE — Telephone Encounter (Signed)
Cathy/[facility]/ [phone] called to advise a drug interaction with ciprofloxacin (CIPRO) 250 MG tablet and amiodarone (PACERONE) 200 MG tablet, Level 2. Please call to advise

## 2017-07-26 NOTE — Telephone Encounter (Signed)
Spoke with son, Madison Berg who stated he called Dr Pennie Banter office, PCP yesterday, and Dr Pennie Banter nurse, Gay Filler told him that it was okay for patient to take Cipro. He did not pick it up because pharmacist told him she was waiting on a response from this office. This RN asked if she is allergic to Cipro as he indicated to NP yesterday she may be. He stated he "wasn't sure".  This RN advised that he wait until she sees Dr Pennie Banter PA this afternoon before picking up Cipro. Advised the PA may decide a different antibiotic is more appropriate. Madison Berg agreed with this, verbalized appreciation for call. Advised him this RN will call pharmacy and tell them to hold Cipro Rx until she sees PA today. Madison Berg verbalized understanding, appreciation for call. Called [facility], [facility] with [facility] and advised her to put Cipro on hold per NP until patient sees PA today. She verbalized understanding.

## 2017-08-24 ENCOUNTER — Ambulatory Visit (INDEPENDENT_AMBULATORY_CARE_PROVIDER_SITE_OTHER): Payer: Medicare Other | Admitting: Internal Medicine

## 2017-08-24 ENCOUNTER — Encounter: Payer: Self-pay | Admitting: Internal Medicine

## 2017-08-24 VITALS — BP 124/62 | HR 91 | Ht 65.0 in | Wt 117.6 lb

## 2017-08-24 DIAGNOSIS — I351 Nonrheumatic aortic (valve) insufficiency: Secondary | ICD-10-CM | POA: Diagnosis not present

## 2017-08-24 DIAGNOSIS — I482 Chronic atrial fibrillation, unspecified: Secondary | ICD-10-CM

## 2017-08-24 DIAGNOSIS — I10 Essential (primary) hypertension: Secondary | ICD-10-CM

## 2017-08-24 NOTE — Patient Instructions (Signed)
Your physician wants you to follow-up in: 6 months with Dr. Hilty. You will receive a reminder letter in the mail two months in advance. If you don't receive a letter, please call our office to schedule the follow-up appointment.    

## 2017-08-24 NOTE — Progress Notes (Signed)
OFFICE NOTE  Chief Complaint:  Routine follow-up  Primary Care Physician: Deland Pretty, MD  HPI:  Madison Berg is a pleasant 84 year old female followed for years by Dr. [name] with a history of paroxysmal atrial fibrillation. She had a stroke in 6301 with an embolic event to her left middle cerebral artery and posterior cerebral artery. She recovered fairly well except she has some mild expressive aphasia. Chest is a history of aortic insufficiency which has been moderate and was recently stable on an echocardiogram in May of 2014. Her ejection fraction is preserved and she said no ischemia. She had a Myoview stress test in February 2011 which was negative. She is on warfarin therapy and her INRs have been therapeutic and stable. She has no complaints of chest pain or shortness of breath. She maintains on amiodarone for rhythm control with her atrial fibrillation. She had recent thyroid function which was in normal limits, but I do not see recent pulmonary function testing. She will need repeat of this testing at her next office visit. Her other main complaint today is problems with bladder incontinence. She previously had seen Dr. Elder Negus and he had advised against medications for incontinence, but her problems have gotten worse and she may be a good candidate for this medication. I understand that she may have had a bladder surgery in the past.  Madison Berg has no new complaints today. She denies any worsening shortness of breath or chest pain. She is again concerned about hair loss, however I do not appreciate any significant worsening of the thinning of her hair. She is asked whether it is okay for her to use biotin supplements. I do not have a problem with that. She has had a couple of more falls but is resistant to using a walker due to decreased mobility and independence. She is since stopped driving.  I saw Madison Berg back in the office today. Unfortunately since I last  saw her she continued to have gait instability and had numerous falls. Ultimately she developed a subdural hematoma and was felt to be a very poor candidate at that point for anticoagulation with warfarin. This was discontinued. She is maintained on aspirin.  Madison Berg is doing fairly well. She's had a couple of episodes of cerebral hemorrhage and was taken off of warfarin for this. She's had no falls recently and uses a 4 point walker. She fortunately has had very little A. fib and remains on low-dose amiodarone 200 mg daily.  I had the pleasure seeing Madison Berg back in the office today for follow-up. Recently she was seen in the ER for an episode of chest pain. She apparently took 2 nitroglycerin for this and her symptoms eventually resolved. She denies any burning under the tongue and therefore her medicine, which is probably quite old me not been effective. Since then she's had no further symptoms. She says she feels great today.  02/05/2016  Madison Berg returns today for follow-up. She's been very stable. She denies any recurrent chest pain. She is maintaining sinus rhythm on low-dose amiodarone and aspirin. Anticoagulation beyond this is contraindicated due to falls and chronic subdural hematomas. She has stable moderate aortic insufficiency with a wide pulse pressure and denies worsening shortness of breath with exertion. She's very concerned about chronic cough. She again asked today if her metoprolol was causing this. I'm unaware of this medicine causing this for her. Amiodarone could feasibly cause this if there were pulmonary fibrosis, however I  do not hear any clinical signs of that on exam. She is scheduled to have upcoming pulmonary function testing and a follow-up appointment with pulmonary.  08/31/2016  Madison Berg returns for follow-up. She reports a couple time she's had episodes where she felt like she was going to pass out. She becomes profoundly weak. Is not clear what her  blood pressure does during these episodes. It may be playing a role in her falls. She had another fall in February and therefore is not anticoagulated. She's been on amiodarone and maintaining sinus rhythm. She also is in bradycardia and this could be playing a role in her weakness as well.  03/10/2017  Madison Berg was seen today in follow-up.  She presents with her son who I met the first time today.  She has had no further episodes of feeling like she might pass out.  Recently however she was in the emergency department with leg discoloration and pain and was felt to have venous insufficiency.  She has venous reflux studies upcoming and is scheduled to see Dr. Doren Custard.  She is also on some neuropathic pain medicine through her neurologist.  There is no evidence for recurrent atrial fibrillation and she maintains on low-dose amiodarone.  She is not anticoagulated due to fall risk and only takes low-dose aspirin.  08/24/2017  Madison Berg returns today for follow-up.  Overall she is doing well from a 84.  She gets around slowly but denies any chest pain or worsening shortness of breath.  Recently she was seen for palpitations by Rosaria Ferries, PA-C.  At that time she increased her amiodarone up to 200 mg daily.  Since then she reports better control of her palpitations.  She is in sinus rhythm today.  Is not anticoagulated due to history of subdural bleed.  PMHx:  Past Medical History:  Diagnosis Date  . Anxiety   . Aphasia as late effect of stroke 04/03/2014  . Cervical spondylosis   . Chronic kidney disease   . CVA (cerebral infarction) 04/2009   LMCA & posterior cerebral - excellent recovery, mild expressive aphasa  . Diverticulitis   . Gait disorder 04/03/2014  . History of nuclear stress test 06/2009   dipyridamole; negative, normal pattern of perfusion   . Hypertension   . Leg swelling   . Multiple lung nodules   . Nephrolithiasis   . OA (osteoarthritis)   . Osteopenia   . PAF  (paroxysmal atrial fibrillation) (HCC)    a. not on anticoagulation due to history of chronic subdural hematomas  . Valvular heart disease   . Vitamin D deficiency     Past Surgical History:  Procedure Laterality Date  . ABDOMINAL HYSTERECTOMY  1975  . APPENDECTOMY    . CATARACT EXTRACTION Bilateral 2013  . Instestinal Surgery  1987   r/Berg blockage  . NOSE SURGERY  1985   r/Berg deviated septum  . REPLACEMENT TOTAL KNEE Right 2004  . TRANSTHORACIC ECHOCARDIOGRAM  09/19/2012   EF 55-60% with normal systolic function; mod AR; MVP with mild-mod regurg; LA severely dilated; RV systolic pressure increased; mod dilated RA; RV systolic pressure increased - mild pulm HTN     FAMHx:  Family History  Problem Relation Age of Onset  . Lymphoma Mother   . Tuberculosis Father   . Tuberculosis Brother   . Aneurysm Other        grandmother, uncles, cousins  . Lung disease Neg Hx     SOCHx:   reports that she  is a non-smoker but has been exposed to tobacco smoke. She has never used smokeless tobacco. She reports that she does not drink alcohol or use drugs.  ALLERGIES:  Allergies  Allergen Reactions  . Amitriptyline Hcl Itching  . Atenolol Other (See Comments)    Hair loss, thin nails, itching  . Azithromycin Other (See Comments)    Mouth sores; sore throat  . Cefuroxime Other (See Comments)    unknown  . Cephalexin Nausea And Vomiting  . Daypro [Oxaprozin] Other (See Comments)    dyspepsia  . Doxycycline Other (See Comments)    Mouth sores; sore throat  . Famvir [Famciclovir] Other (See Comments)    unknown  . Hyzaar [Losartan Potassium-Hctz] Other (See Comments)    Headache; tight face  . Iodine Other (See Comments)    Involuntary muscle contractions  . Metoprolol Tartrate Itching  . Relafen [Nabumetone] Other (See Comments)    Mouth sores  . Sterapred [Prednisone] Other (See Comments)    weakness    ROS: Pertinent items noted in HPI and remainder of comprehensive ROS  otherwise negative.  HOME MEDS: Current Outpatient Medications  Medication Sig Dispense Refill  . amiodarone (PACERONE) 200 MG tablet Take 1 tablet (200 mg total) by mouth daily. 30 tablet 6  . amLODipine (NORVASC) 10 MG tablet Take 10 mg by mouth at bedtime.     Marland Kitchen aspirin EC 81 MG tablet Take 81 mg by mouth daily.    . B Complex-C (B-COMPLEX WITH VITAMIN C) tablet Take 1 tablet by mouth daily.    . Cholecalciferol 1000 units tablet Take 1,000 Units by mouth daily.    Marland Kitchen gabapentin (NEURONTIN) 100 MG capsule TAKE ONE CAPSULE BY MOUTH 3 TIMES A DAY 90 capsule 3  . hydrALAZINE (APRESOLINE) 10 MG tablet Take 20 mg by mouth 2 (two) times daily.    . hydroxychloroquine (PLAQUENIL) 200 MG tablet Take 1 tablet by mouth daily.  3  . levothyroxine (SYNTHROID, LEVOTHROID) 50 MCG tablet Take 50 mcg by mouth daily before breakfast.    . Multiple Vitamins-Minerals (MULTIVITAMIN & MINERAL PO) Take 1 tablet by mouth daily.    . nitroGLYCERIN (NITROSTAT) 0.4 MG SL tablet Place 1 tablet (0.4 mg total) under the tongue every 5 (five) minutes as needed for chest pain. 25 tablet 3  . Omega-3 Fatty Acids (FISH OIL PO) Take 1 tablet by mouth at bedtime.     No current facility-administered medications for this visit.     LABS/IMAGING: No results found for this or any previous visit (from the past 48 hour(s)). No results found.  VITALS: BP 124/62   Pulse 91   Ht '5\' 5"'$  (1.651 m)   Wt 117 lb 9.6 oz (53.3 kg)   BMI 19.57 kg/m   EXAM: General appearance: alert and no distress Neck: no carotid bruit and no JVD Lungs: clear to auscultation bilaterally Heart: regular rate and rhythm, S1, S2 normal and diastolic murmur: holodiastolic 2/6, blowing at 2nd right intercostal space Abdomen: soft, non-tender; bowel sounds normal; no masses,  no organomegaly Extremities: extremities normal, atraumatic, no cyanosis or edema Pulses: 2+ and symmetric Skin: Skin color, texture, turgor normal. No rashes or  lesions Neurologic: Grossly normal, marked expressive aphasia Psych: Mood, affect normal  EKG: Sinus rhythm with first-degree AV block at 91 -personally reviewed  ASSESSMENT: 1. Stable moderate aortic insufficiency 2. Paroxysmal atrial fibrillation - CHADSVASC 4- on aspirin and amiodarone 3. Hypertension-controlled 4. Dizziness with falls 5. Chronic subdural hematomas - warfarin is  contraindicated 6. Chest pain 7. Chronic cough 8. Presyncope  PLAN: 1.   Madison Berg needs to be asymptomatic.  She had recent palpitations which were improved with an increase in her amiodarone.  She is on low-dose aspirin but not anticoagulated further due to history of subdural hematoma.  Blood pressures been labile but generally well controlled.  She has had no recent falls.  We will continue current medications plan to see her back in 6 months or sooner as necessary.  Pixie Casino, MD, [facility], [place] Director of the [facility]  [facility] Diplomate of the American Board of Clinical Lipidology Attending Cardiologist  Direct Dial: [phone]  Fax: [phone]  Website:  [email or web address] 08/24/2017, 3:37 PM

## 2017-09-14 ENCOUNTER — Other Ambulatory Visit: Payer: Self-pay

## 2017-09-14 ENCOUNTER — Ambulatory Visit (HOSPITAL_COMMUNITY)
Admission: RE | Admit: 2017-09-14 | Discharge: 2017-09-14 | Disposition: A | Discharge status: Home / Self Care | Payer: Medicare Other | Source: Ambulatory Visit | Attending: Vascular Surgery | Admitting: Vascular Surgery

## 2017-09-14 ENCOUNTER — Encounter: Payer: Self-pay | Admitting: Physician Assistant

## 2017-09-14 ENCOUNTER — Ambulatory Visit (INDEPENDENT_AMBULATORY_CARE_PROVIDER_SITE_OTHER): Payer: Medicare Other | Admitting: Physician Assistant

## 2017-09-14 VITALS — BP 128/67 | HR 84 | Resp 18 | Ht 65.0 in | Wt 115.5 lb

## 2017-09-14 DIAGNOSIS — I739 Peripheral vascular disease, unspecified: Secondary | ICD-10-CM | POA: Diagnosis present

## 2017-09-14 DIAGNOSIS — Z8673 Personal history of transient ischemic attack (TIA), and cerebral infarction without residual deficits: Secondary | ICD-10-CM | POA: Diagnosis not present

## 2017-09-14 DIAGNOSIS — I10 Essential (primary) hypertension: Secondary | ICD-10-CM | POA: Diagnosis not present

## 2017-09-14 DIAGNOSIS — R600 Localized edema: Secondary | ICD-10-CM | POA: Diagnosis not present

## 2017-09-14 NOTE — Progress Notes (Signed)
Established Intermittent Claudication   History of Present Illness   Madison Berg is a 84 y.o. ([birth year]) female who presents to go over ABI study.  She had been seen in office about 6 months ago due to a healing dorsal right foot wound caused by a brace that was worn during a recent hospitalization.  The patient states that this area has completely healed since last office visit.  She still experiences edema of bilateral lower extremities right worse than left however does not report any ulceration or painful varicosities.  She is ambulatory with a walker however admittedly does not walk much during the day.  She denies a claudication story, rest pain, or active tissue ischemia bilateral lower extremityies.  She is taking aspirin daily.  She denies tobacco use.  Past medical history is also significant for peripheral neuropathy with numbness in the toes of both feet.  The patient's PMH, PSH, SH, and FamHx were reviewed on todays visit and are unchanged from  prior visit.  Current Outpatient Medications  Medication Sig Dispense Refill  . amiodarone (PACERONE) 200 MG tablet Take 1 tablet (200 mg total) by mouth daily. 30 tablet 6  . amLODipine (NORVASC) 10 MG tablet Take 10 mg by mouth at bedtime.     Marland Kitchen aspirin EC 81 MG tablet Take 81 mg by mouth daily.    . B Complex-C (B-COMPLEX WITH VITAMIN C) tablet Take 1 tablet by mouth daily.    . Cholecalciferol 1000 units tablet Take 1,000 Units by mouth daily.    Marland Kitchen gabapentin (NEURONTIN) 100 MG capsule TAKE ONE CAPSULE BY MOUTH 3 TIMES A DAY 90 capsule 3  . hydrALAZINE (APRESOLINE) 10 MG tablet Take 20 mg by mouth 2 (two) times daily.    . hydroxychloroquine (PLAQUENIL) 200 MG tablet Take 1 tablet by mouth daily.  3  . levothyroxine (SYNTHROID, LEVOTHROID) 50 MCG tablet Take 50 mcg by mouth daily before breakfast.    . Multiple Vitamins-Minerals (MULTIVITAMIN & MINERAL PO) Take 1 tablet by mouth daily.    . nitroGLYCERIN (NITROSTAT) 0.4 MG  SL tablet Place 1 tablet (0.4 mg total) under the tongue every 5 (five) minutes as needed for chest pain. 25 tablet 3  . Omega-3 Fatty Acids (FISH OIL PO) Take 1 tablet by mouth at bedtime.     No current facility-administered medications for this visit.     On ROS today: 10 system ROS is negative unless otherwise noted in HPI   Physical Examination   Vitals:   09/14/17 1417  BP: 128/67  Pulse: 84  Resp: 18  SpO2: 98%  Weight: 115 lb 8 oz (52.4 kg)  Height: 5\' 5"  (1.651 m)   Body mass index is 19.22 kg/m.  General Alert, O x 3, WD, NAD  Pulmonary Sym exp,  Cardiac RRR, Nl S1, S2,   Vascular Vessel Right Left  Radial Palpable Palpable  Brachial Palpable Palpable  Carotid Palpable, No Bruit Palpable, No Bruit  Aorta Not palpable N/A  PT Not palpable Not palpable  DP Palpable Palpable    Gastro- intestinal soft, non-distended,  Musculo- skeletal M/S 5/5 throughout  , Pitting edema to the level of the proximal shin bilateral lower extremities right worse than left; no weeping or venous ulceration noted  Neurologic Pain and light touch intact in extremities , Motor exam as listed above    Non-Invasive Vascular imaging   ABI (09/14/17)  R:   ABI: 1.17   PT: bi  DP: tri  TBI:  0.84  L:   ABI: 1.29,   PT: tri  DP: tri  TBI: 0.79    Medical Decision Making   Madison Berg is a 84 y.o. female who presents with healed wound of right dorsal foot   Normal ABIs performed today as well as palpable pedal pulses bilaterally  Continue aspirin daily  Encouraged ambulation with walker  Recommended elevation of bilateral lower extremities when sitting  Follow-up as needed  Dagoberto Ligas, PA-C [facility] [place] [place] Office: [phone]

## 2017-11-10 ENCOUNTER — Ambulatory Visit (INDEPENDENT_AMBULATORY_CARE_PROVIDER_SITE_OTHER): Payer: Medicare Other | Admitting: *Deleted

## 2017-11-10 ENCOUNTER — Telehealth: Payer: Self-pay | Admitting: Internal Medicine

## 2017-11-10 DIAGNOSIS — I480 Paroxysmal atrial fibrillation: Secondary | ICD-10-CM

## 2017-11-10 NOTE — Telephone Encounter (Signed)
Returned the call to the patient's son per the dpr. He stated that the patient started having palpitations last night and into this morning. She feels like she is back in atrial fibrillation. The patient is asymptomatic denying chest pain and shortness of breath. Blood pressure today was 142/58 and heart rate was 72. The patient had just taken her 200 mg Amiodarone. The son was taking the patient to an orthopedic appointment. He asked that he be called back at 1pm for an update.

## 2017-11-10 NOTE — Progress Notes (Signed)
Patient's son called in stating that the patient was having palpitations and was concerned that she may be in atrial fibrillation. The patient requested to come in for an EKG nurse visit.  EKG was completed and the patient was not in afib. DOD made aware and no changes were made. The patient has been advised to call if anything further is needed.

## 2017-11-10 NOTE — Telephone Encounter (Signed)
New message    Patient c/o Palpitations:  High priority if patient c/o lightheadedness, shortness of breath, or chest pain  1) How long have you had palpitations/irregular HR/ Afib? Are you having the symptoms now? palps started last night  2) Are you currently experiencing lightheadedness, SOB or CP? NO  3) Do you have a history of afib (atrial fibrillation) or irregular heart rhythm? YES  4) Have you checked your BP or HR? (document readings if available): NO  5) Are you experiencing any other symptoms? NO

## 2017-11-10 NOTE — Telephone Encounter (Signed)
Returned the call to the patient's son. The patient is still having palpitations. She will come in for an EKG as nurse visit today at 1:20

## 2017-11-28 ENCOUNTER — Other Ambulatory Visit: Payer: Self-pay | Admitting: Physician Assistant

## 2018-01-14 ENCOUNTER — Other Ambulatory Visit: Payer: Self-pay | Admitting: Neurology

## 2018-01-23 ENCOUNTER — Other Ambulatory Visit: Payer: Self-pay | Admitting: Internal Medicine

## 2018-01-23 DIAGNOSIS — Z1231 Encounter for screening mammogram for malignant neoplasm of breast: Secondary | ICD-10-CM

## 2018-01-24 ENCOUNTER — Ambulatory Visit
Admission: RE | Admit: 2018-01-24 | Discharge: 2018-01-24 | Disposition: A | Discharge status: Home / Self Care | Payer: Medicare Other | Source: Ambulatory Visit | Attending: Internal Medicine | Admitting: Internal Medicine

## 2018-01-24 ENCOUNTER — Other Ambulatory Visit: Payer: Self-pay | Admitting: Internal Medicine

## 2018-01-24 DIAGNOSIS — Z1231 Encounter for screening mammogram for malignant neoplasm of breast: Secondary | ICD-10-CM

## 2018-02-07 ENCOUNTER — Other Ambulatory Visit: Payer: Self-pay | Admitting: Surgery

## 2018-02-08 ENCOUNTER — Ambulatory Visit (INDEPENDENT_AMBULATORY_CARE_PROVIDER_SITE_OTHER): Payer: Medicare Other | Admitting: Adult Health

## 2018-02-08 ENCOUNTER — Encounter: Payer: Self-pay | Admitting: Adult Health

## 2018-02-08 VITALS — BP 160/64 | HR 79 | Ht 65.0 in | Wt 118.6 lb

## 2018-02-08 DIAGNOSIS — R269 Unspecified abnormalities of gait and mobility: Secondary | ICD-10-CM | POA: Diagnosis not present

## 2018-02-08 DIAGNOSIS — G629 Polyneuropathy, unspecified: Secondary | ICD-10-CM

## 2018-02-08 NOTE — Patient Instructions (Signed)
Your Plan:  Continue Gabapentin 100 mg three times a day If your symptoms worsen or you develop new symptoms please let us know.   Thank you for coming to see [place] at [facility]. I hope we have been able to provide you high quality care today.  You may receive a patient satisfaction survey over the next few weeks. We would appreciate your feedback and comments so that we may continue to improve ourselves and the health of our patients.

## 2018-02-08 NOTE — Progress Notes (Signed)
PATIENT: Madison Berg DOB: [birth year]  REASON FOR VISIT: follow up HISTORY FROM: patient  HISTORY OF PRESENT ILLNESS: Today 02/08/18:  Madison Berg is a 84 year old female with a history of peripheral neuropathy and gait disorder.  She returns today for follow-up.  She reports that gabapentin has given her benefit in regards to her neuropathy.  She states that her legs still feel cold but she does not have a lot of discomfort.  She states that her last fall was approximately 3 months ago.  Fortunately she did not sustain any injuries.  She states that if she stands for a long time her legs does hurt.  She also states that at times her legs become very restless.  She denies any new symptoms.  She returns today for evaluation.  HISTORY Madison Berg is a 84 year old female with a history of gait disorder, peripheral neuropathy and numbness in the hands.  She returns today for follow-up.  The patient continues on gabapentin 100 mg at bedtime.  He reports that this works well for her discomfort in the lower extremities.  She reports occasionally she will have some numbness but no significant discomfort.  She denies any falls since the last visit.  She does use a walker when ambulating.  The patient is mainly concerned today about feeling in the left hand.  She states that it started about 10 days ago when she noticed a red mark on her thumb that she thought was a cut but it was not.  She states that since then the whole hand has swollen and she has redness that extends up to the middle of the forearm.  She states that she has a hard time making a fist and the hand is tender to touch.  She has not seen her primary care provider for this.  She was taken to the emergency room March 18 for pain in the left thumb.  X-rays of the wrist and left hand were completed that did not show fractures but did show osteoarthritic changes.  She returns today for an evaluation.  REVIEW OF SYSTEMS: Out of a complete  14 system review of symptoms, the patient complains only of the following symptoms, and all other reviewed systems are negative.  See HPI  ALLERGIES: Allergies  Allergen Reactions  . Amitriptyline Hcl Itching  . Atenolol Other (See Comments)    Hair loss, thin nails, itching  . Azithromycin Other (See Comments)    Mouth sores; sore throat  . Cefuroxime Other (See Comments)    unknown  . Cephalexin Nausea And Vomiting  . Daypro [Oxaprozin] Other (See Comments)    dyspepsia  . Doxycycline Other (See Comments)    Mouth sores; sore throat  . Famvir [Famciclovir] Other (See Comments)    unknown  . Hyzaar [Losartan Potassium-Hctz] Other (See Comments)    Headache; tight face  . Iodine Other (See Comments)    Involuntary muscle contractions  . Metoprolol Tartrate Itching  . Relafen [Nabumetone] Other (See Comments)    Mouth sores  . Sterapred [Prednisone] Other (See Comments)    weakness    HOME MEDICATIONS: Outpatient Medications Prior to Visit  Medication Sig Dispense Refill  . amiodarone (PACERONE) 200 MG tablet Take 1 tablet (200 mg total) by mouth daily. 30 tablet 6  . amLODipine (NORVASC) 10 MG tablet Take 10 mg by mouth at bedtime.     Marland Kitchen aspirin EC 81 MG tablet Take 81 mg by mouth daily.    Marland Kitchen  B Complex-C (B-COMPLEX WITH VITAMIN C) tablet Take 1 tablet by mouth daily.    . Cholecalciferol 1000 units tablet Take 1,000 Units by mouth daily.    Marland Kitchen gabapentin (NEURONTIN) 100 MG capsule TAKE 1 CAPSULE BY MOUTH THREE TIMES A DAY 90 capsule 3  . hydrALAZINE (APRESOLINE) 10 MG tablet Take 20 mg by mouth 2 (two) times daily.    . hydrALAZINE (APRESOLINE) 10 MG tablet Take 2 tablets (20 mg total) by mouth 2 (two) times daily. 360 tablet 2  . hydroxychloroquine (PLAQUENIL) 200 MG tablet Take 1 tablet by mouth daily.  3  . levothyroxine (SYNTHROID, LEVOTHROID) 50 MCG tablet Take 50 mcg by mouth daily before breakfast.    . Multiple Vitamins-Minerals (MULTIVITAMIN & MINERAL PO) Take 1  tablet by mouth daily.    . nitroGLYCERIN (NITROSTAT) 0.4 MG SL tablet Place 1 tablet (0.4 mg total) under the tongue every 5 (five) minutes as needed for chest pain. 25 tablet 3  . Omega-3 Fatty Acids (FISH OIL PO) Take 1 tablet by mouth at bedtime.     No facility-administered medications prior to visit.     PAST MEDICAL HISTORY: Past Medical History:  Diagnosis Date  . Anxiety   . Aphasia as late effect of stroke 04/03/2014  . Carpal tunnel syndrome   . Cervical spondylosis   . Chronic kidney disease   . CVA (cerebral infarction) 04/2009   LMCA & posterior cerebral - excellent recovery, mild expressive aphasa  . Diverticulitis   . Gait disorder 04/03/2014  . History of nuclear stress test 06/2009   dipyridamole; negative, normal pattern of perfusion   . Hypertension   . Leg swelling   . Multiple lung nodules   . Nephrolithiasis   . OA (osteoarthritis)   . Osteopenia   . PAF (paroxysmal atrial fibrillation) (HCC)    a. not on anticoagulation due to history of chronic subdural hematomas  . Valvular heart disease   . Vitamin D deficiency     PAST SURGICAL HISTORY: Past Surgical History:  Procedure Laterality Date  . ABDOMINAL HYSTERECTOMY  1975  . APPENDECTOMY    . CATARACT EXTRACTION Bilateral 2013  . Instestinal Surgery  1987   r/t blockage  . NOSE SURGERY  1985   r/t deviated septum  . REPLACEMENT TOTAL KNEE Right 2004  . TRANSTHORACIC ECHOCARDIOGRAM  09/19/2012   EF 55-60% with normal systolic function; mod AR; MVP with mild-mod regurg; LA severely dilated; RV systolic pressure increased; mod dilated RA; RV systolic pressure increased - mild pulm HTN     FAMILY HISTORY: Family History  Problem Relation Age of Onset  . Lymphoma Mother   . Tuberculosis Father   . Tuberculosis Brother   . Aneurysm Other        grandmother, uncles, cousins  . Lung disease Neg Hx     SOCIAL HISTORY: Social History   Socioeconomic History  . Marital status: Widowed    Spouse  name: Not on file  . Number of children: 4  . Years of education: Not on file  . Highest education level: Not on file  Occupational History    Comment: Retired  Scientific laboratory technician  . Financial resource strain: Not on file  . Food insecurity:    Worry: Not on file    Inability: Not on file  . Transportation needs:    Medical: Not on file    Non-medical: Not on file  Tobacco Use  . Smoking status: Passive Smoke Exposure - Never  Smoker  . Smokeless tobacco: Never Used  . Tobacco comment: Exposed at work, with her husband, & with her father.  Substance and Sexual Activity  . Alcohol use: No    Alcohol/week: 0.0 standard drinks  . Drug use: No  . Sexual activity: Not on file  Lifestyle  . Physical activity:    Days per week: Not on file    Minutes per session: Not on file  . Stress: Not on file  Relationships  . Social connections:    Talks on phone: Not on file    Gets together: Not on file    Attends religious service: Not on file    Active member of club or organization: Not on file    Attends meetings of clubs or organizations: Not on file    Relationship status: Not on file  . Intimate partner violence:    Fear of current or ex partner: Not on file    Emotionally abused: Not on file    Physically abused: Not on file    Forced sexual activity: Not on file  Other Topics Concern  . Not on file  Social History Narrative   Patient is right handed.   No caffeine   Live at home with two sons   Education one year of college.   Retired.      [facility]:   Originally from [facility]. She has always lived in [place]. No international travel. She does have prior travel to [facility]. 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 They have no pets currently. No bird exposure. She does have one plant indoor currently. Does have carpet extensively throughout the home. No mold exposure.       PHYSICAL EXAM  Vitals:   02/08/18 1405  Height: 5\' 5"  (1.651 m)   Body mass index is 19.22  kg/m.  Generalized: Well developed, in no acute distress   Neurological examination  Mentation: Alert oriented to time, place, history taking. Follows all commands speech and language fluent Cranial nerve II-XII: Pupils were equal round reactive to light. Extraocular movements were full, visual field were full on confrontational test. Facial sensation and strength were normal. Uvula tongue midline. Head turning and shoulder shrug  were normal and symmetric. Motor: The motor testing reveals 5 over 5 strength of all 4 extremities. Good symmetric motor tone is noted throughout.  Sensory: Sensory testing is intact to soft touch on all 4 extremities. No evidence of extinction is noted.  Coordination: Cerebellar testing reveals good finger-nose-finger and heel-to-shin bilaterally.  Gait and station: Patient uses a walker when ambulating.  Tandem gait not attempted. Reflexes: Deep tendon reflexes are symmetric and normal bilaterally.   DIAGNOSTIC DATA (LABS, IMAGING, TESTING) - I reviewed patient records, labs, notes, testing and imaging myself where available.  Lab Results  Component Value Date   WBC 8.5 07/18/2017   HGB 12.6 07/18/2017   HCT 38.7 07/18/2017   MCV 98.2 07/18/2017   PLT 230 07/18/2017      Component Value Date/Time   NA 139 07/18/2017 1145   K 4.4 07/18/2017 1145   CL 106 07/18/2017 1145   CO2 22 07/18/2017 1145   GLUCOSE 131 (H) 07/18/2017 1145   BUN 35 (H) 07/18/2017 1145   CREATININE 1.68 (H) 07/18/2017 1145   CALCIUM 9.3 07/18/2017 1145   PROT 6.7 12/05/2016 0119   PROT 7.0 08/05/2014 1430   ALBUMIN 3.8 12/05/2016 0119   AST 59 (H) 12/05/2016 0119   ALT 68 (H)  12/05/2016 0119   ALKPHOS 103 12/05/2016 0119   BILITOT 1.3 (H) 12/05/2016 0119   GFRNONAA 26 (L) 07/18/2017 1145   GFRAA 30 (L) 07/18/2017 1145    Lab Results  Component Value Date   HGBA1C  03/23/2009    5.6 (NOTE) The ADA recommends the following therapeutic goal for glycemic control related to  Hgb A1c measurement: Goal of therapy: <6.5 Hgb A1c  Reference: American Diabetes Association: Clinical Practice Recommendations 2010, Diabetes Care, 2010, 33: (Suppl  1).   Lab Results  Component Value Date   [id number] 338 08/05/2014   Lab Results  Component Value Date   TSH 4.290 12/05/2016      ASSESSMENT AND PLAN 84 y.o. year old female  has a past medical history of Anxiety, Aphasia as late effect of stroke (04/03/2014), Carpal tunnel syndrome, Cervical spondylosis, Chronic kidney disease, CVA (cerebral infarction) (04/2009), Diverticulitis, Gait disorder (04/03/2014), History of nuclear stress test (06/2009), Hypertension, Leg swelling, Multiple lung nodules, Nephrolithiasis, OA (osteoarthritis), Osteopenia, PAF (paroxysmal atrial fibrillation) ([place]), Valvular heart disease, and Vitamin D deficiency. here with:  1.  Peripheral neuropathy 2.  Gait disorder  Overall the patient is doing well.  She will continue on gabapentin 100 mg 3 times a day.  She is advised that if her symptoms worsen or she develops new symptoms she should let us know.  She will follow-up in 6 months or sooner if needed.    Ward Givens, MSN, NP-C 02/08/2018, 2:25 PM [facility] [address], [place], [place] [address] [phone]

## 2018-02-08 NOTE — Progress Notes (Signed)
I have read the note, and I agree with the clinical assessment and plan.  Ulyses Panico K Beza [place]   

## 2018-02-10 ENCOUNTER — Other Ambulatory Visit (HOSPITAL_COMMUNITY): Payer: Self-pay | Admitting: Surgery

## 2018-02-13 ENCOUNTER — Other Ambulatory Visit (HOSPITAL_COMMUNITY): Payer: Self-pay | Admitting: Surgery

## 2018-02-13 DIAGNOSIS — R1909 Other intra-abdominal and pelvic swelling, mass and lump: Secondary | ICD-10-CM

## 2018-02-16 ENCOUNTER — Ambulatory Visit (HOSPITAL_COMMUNITY)
Admission: RE | Admit: 2018-02-16 | Discharge: 2018-02-16 | Disposition: A | Discharge status: Home / Self Care | Payer: Medicare Other | Source: Ambulatory Visit | Attending: Surgery | Admitting: Surgery

## 2018-02-16 DIAGNOSIS — R1909 Other intra-abdominal and pelvic swelling, mass and lump: Secondary | ICD-10-CM | POA: Diagnosis not present

## 2018-03-06 ENCOUNTER — Encounter (HOSPITAL_COMMUNITY): Payer: Self-pay

## 2018-03-06 ENCOUNTER — Ambulatory Visit (HOSPITAL_COMMUNITY): Payer: Medicare Other

## 2018-03-13 IMAGING — MR MR HEAD W/O CM
9 of 10 series · 36 of 48 positions shown · non-contrast
Comparison: 05/02/2014 CT head.  MRI brain 03/24/2009.

CLINICAL DATA: Increased dizzy spells over the past 2 weeks.
Confused and disoriented.

EXAM:
MRI HEAD WITHOUT CONTRAST
TECHNIQUE: Multiplanar, multiecho pulse sequences of the brain and surrounding
structures were obtained without intravenous contrast.

[Series 3: DWI · axial · 3.0mm · 1.09mm/px · z∈[-74,+58]mm · 9 of 92 slices shown (1 of 4)]
[im 1/92]
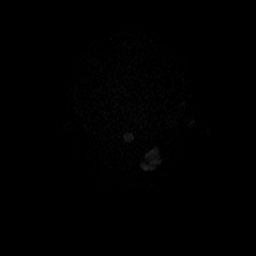
[im 12/92]
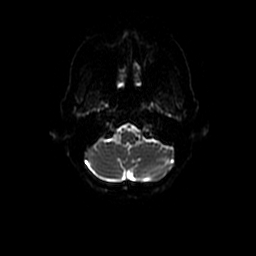
[im 23/92]
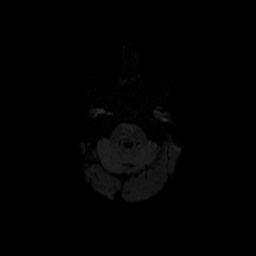
[im 35/92]
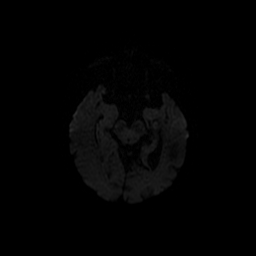
[im 46/92]
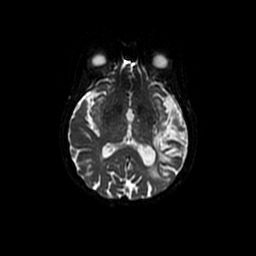
[im 57/92]
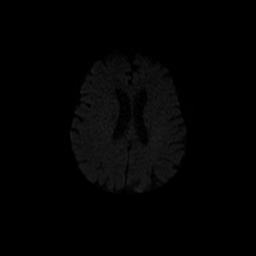
[im 69/92]
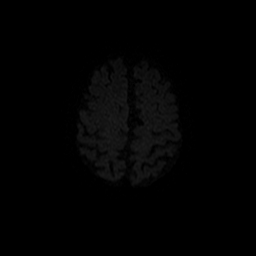
[im 80/92]
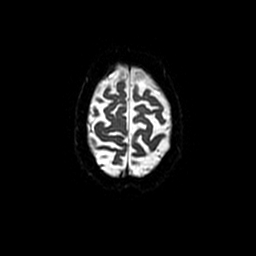
[im 92/92]
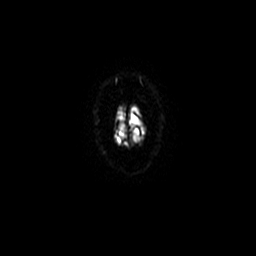

[Series 4: T1 · sagittal · 5.0mm · 0.47mm/px · 2 of 24 slices shown]
[im 1/24]
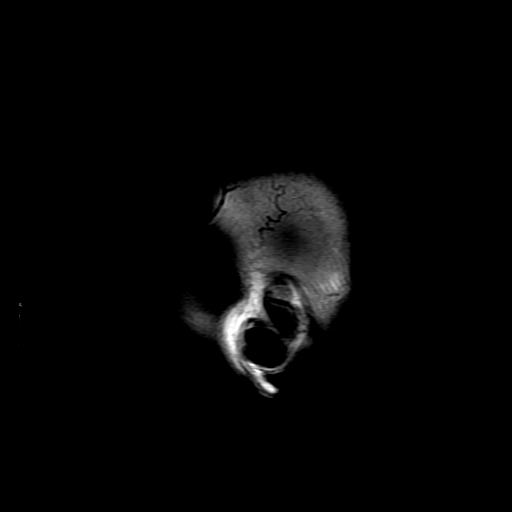
[im 24/24]
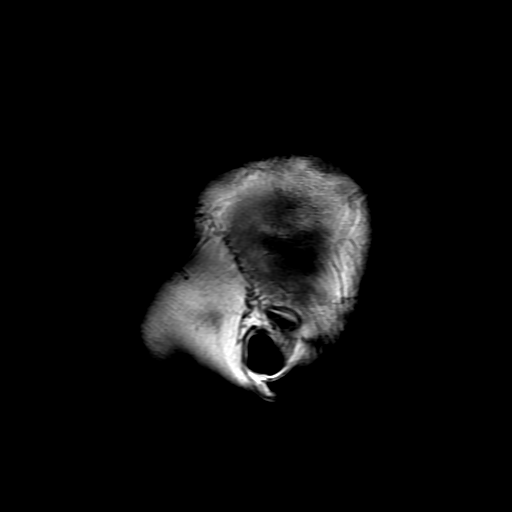

[Series 5: DWI · coronal · 5.0mm · 1.09mm/px · 7 of 66 slices shown (2 of 4)]
[im 1/66]
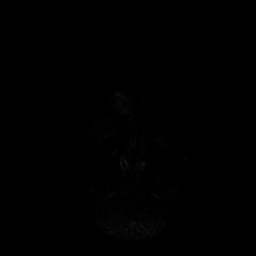
[im 11/66]
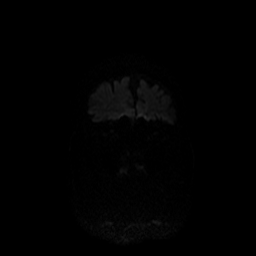
[im 22/66]
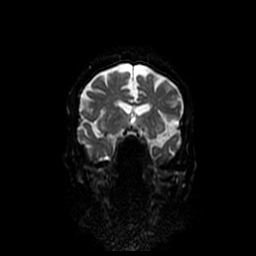
[im 33/66]
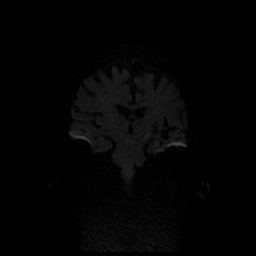
[im 44/66]
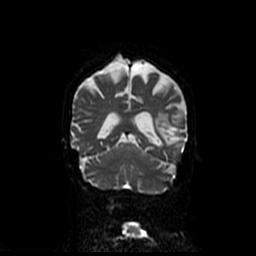
[im 55/66]
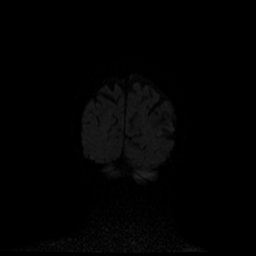
[im 66/66]
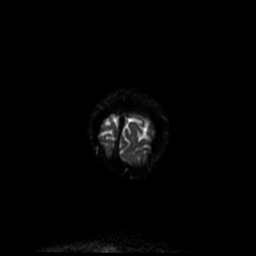

[Series 6: FLAIR · axial · 5.0mm · 0.43mm/px · z∈[-70,+72]mm · 3 of 25 slices shown]
[im 1/25]
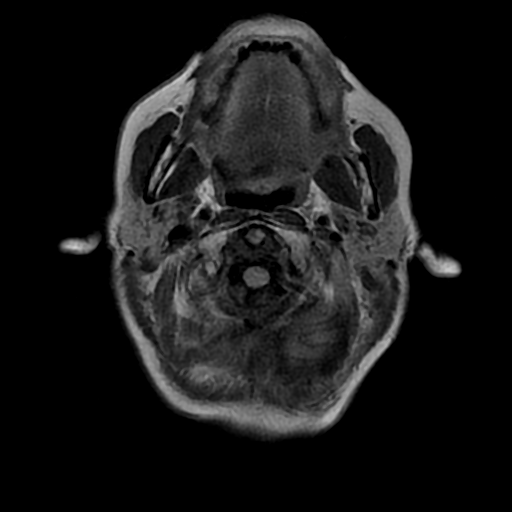
[im 13/25]
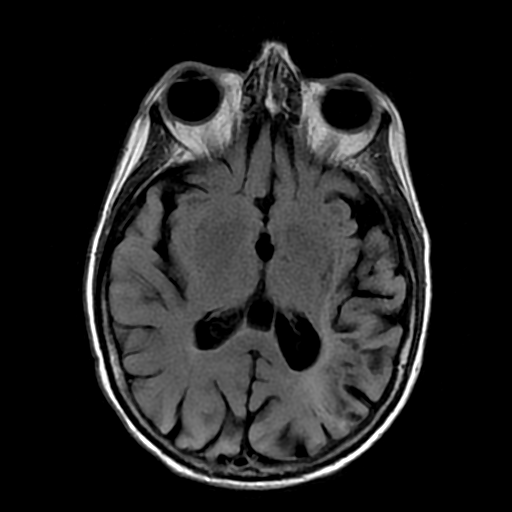
[im 25/25]
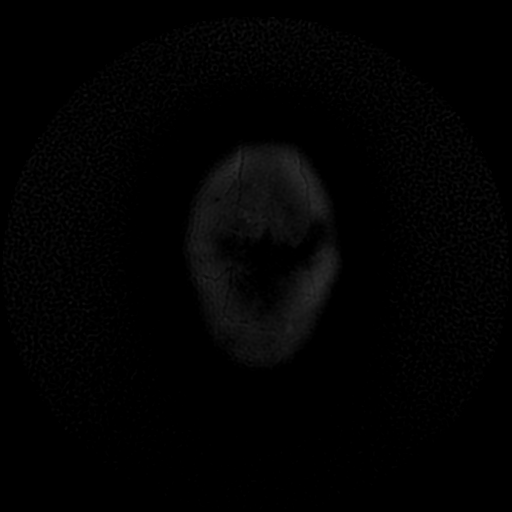

[Series 7: T2 · axial · 5.0mm · 0.43mm/px · z∈[-70,+72]mm · 3 of 25 slices shown (1 of 2)]
[im 1/25]
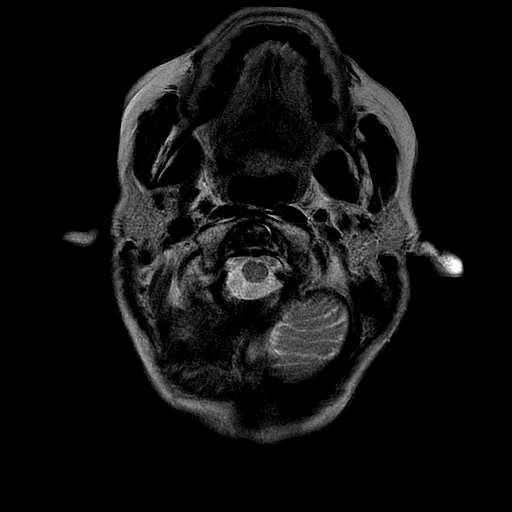
[im 13/25]
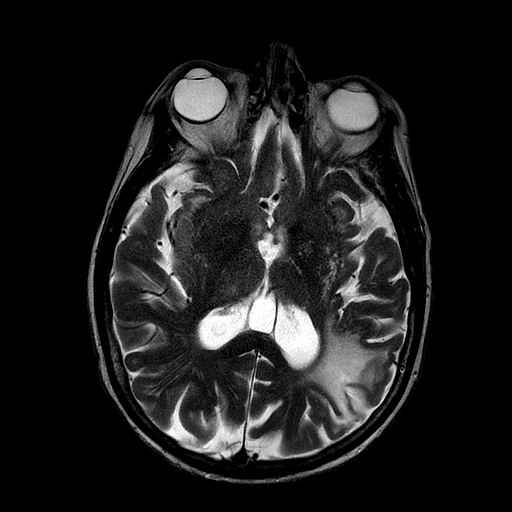
[im 25/25]
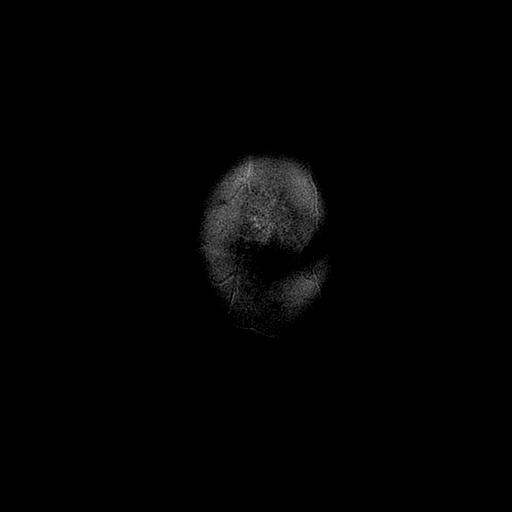

[Series 8: ax mpgr · axial · 5.0mm · 0.43mm/px · 1 of 26 slices shown]
[im 1/26]
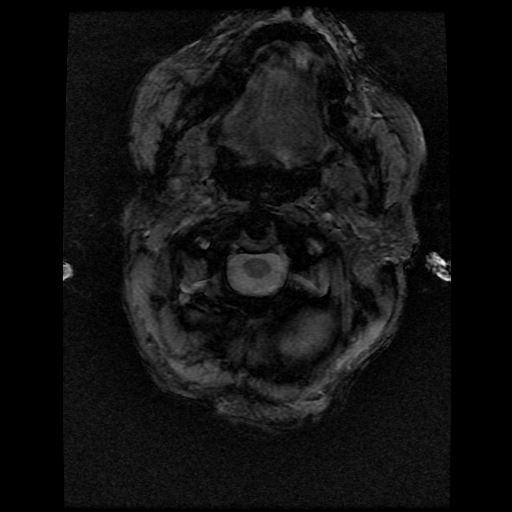

[Series 10: T2 · coronal · 5.0mm · 0.43mm/px · 3 of 31 slices shown (2 of 2)]
[im 1/31]
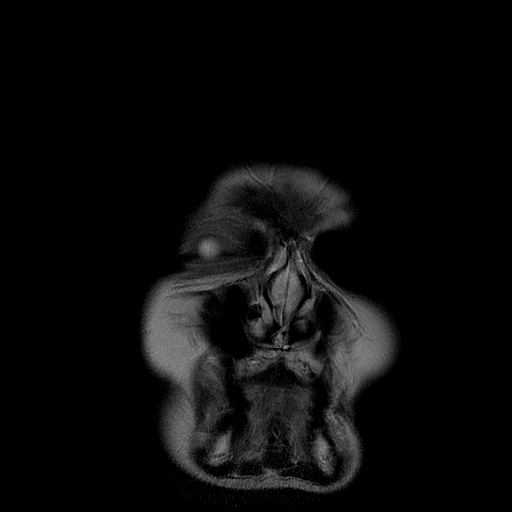
[im 16/31]
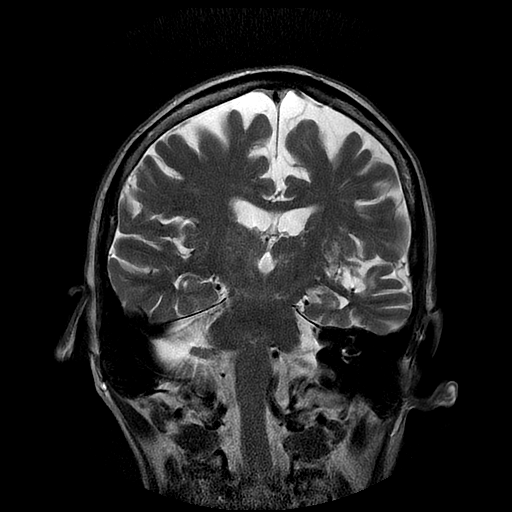
[im 31/31]
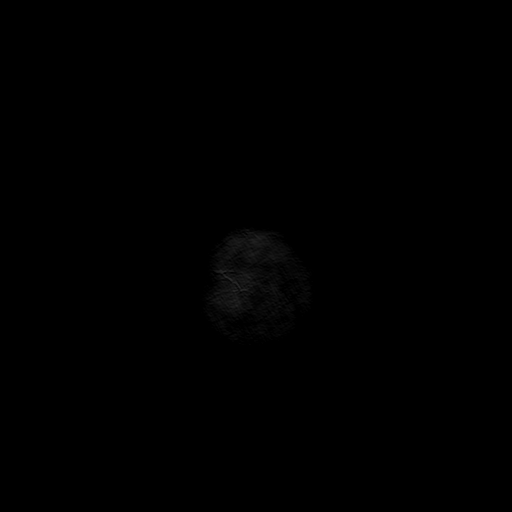

[Series 300: DWI · axial · 3.0mm · 1.09mm/px · z∈[-74,+58]mm · 5 of 46 slices shown (3 of 4)]
[im 1/46]
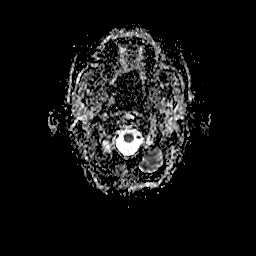
[im 12/46]
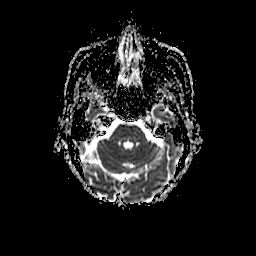
[im 23/46]
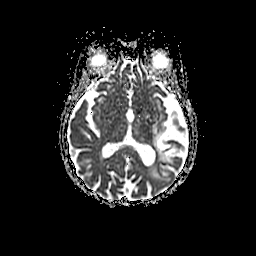
[im 34/46]
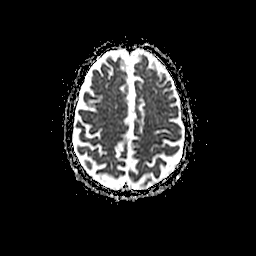
[im 46/46]
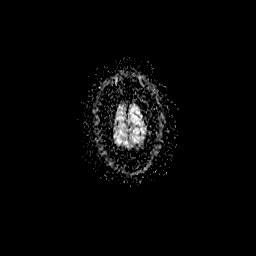

[Series 500: DWI · coronal · 5.0mm · 1.09mm/px · 3 of 33 slices shown (4 of 4)]
[im 1/33]
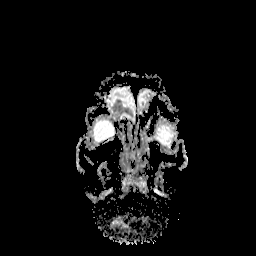
[im 17/33]
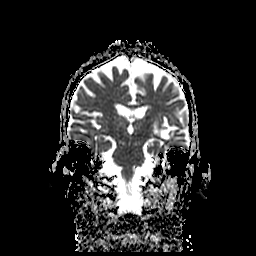
[im 33/33]
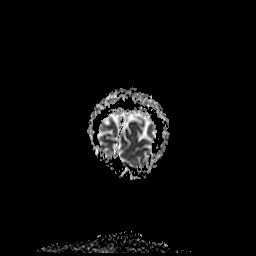

[36 of 48 positions shown; findings below may reference images not displayed]

FINDINGS: No evidence for acute infarction, hemorrhage, mass lesion,
hydrocephalus, or extra-axial fluid. Generalized atrophy. Chronic
microvascular ischemic change. Large remote LEFT hemisphere infarct,
with gliosis and encephalomalacia affecting the posterior frontal
and posterior temporal lobe particularly. Flow voids are preserved,
but there is marked dolichoectasia of the LEFT vertebral and
basilar. Normal pituitary and cerebellar tonsils. Cervical
spondylosis. Extracranial soft tissues unremarkable. Chronic sinus
disease. BILATERAL cataract extraction.
IMPRESSION: No acute intracranial abnormality.

Atrophy and small vessel disease.

Remote LEFT MCA territory infarction, nonhemorrhagic.

## 2018-03-27 ENCOUNTER — Other Ambulatory Visit: Payer: Self-pay | Admitting: Internal Medicine

## 2018-03-28 ENCOUNTER — Other Ambulatory Visit: Payer: Self-pay | Admitting: Internal Medicine

## 2018-08-13 ENCOUNTER — Other Ambulatory Visit: Payer: Self-pay | Admitting: Adult Health

## 2018-08-29 ENCOUNTER — Other Ambulatory Visit: Payer: Self-pay | Admitting: Adult Health

## 2018-09-01 ENCOUNTER — Telehealth: Payer: Self-pay | Admitting: Internal Medicine

## 2018-09-01 DIAGNOSIS — I480 Paroxysmal atrial fibrillation: Secondary | ICD-10-CM

## 2018-09-01 MED ORDER — AMIODARONE HCL 200 MG PO TABS: ORAL_TABLET | ORAL | 3 refills | Status: DC

## 2018-09-01 MED ORDER — AMIODARONE HCL 200 MG PO TABS: 200.0000 mg | ORAL_TABLET | Freq: Two times a day (BID) | ORAL | 6 refills | Status: DC

## 2018-09-01 NOTE — Telephone Encounter (Signed)
Returned call to patient's son (DPR). Patient has h/o PAF not anticoagulated d/t history of subdural bleed. He reports patient has had intermittent heart fluttering since Tuesday but was persistent last night. Patient denies chest pain/SOB. Patient is compliant with medications - takes amiodarone consistently in AM. She had been on plaquenil for arthritic condition in hand, per son, and he reports SE of this med can be disruptions in heart rate. He reports she stopped this med - last dose 4/30 AM.   VS noted below.   Advised will consult provider and notify them of recommendations.

## 2018-09-01 NOTE — Telephone Encounter (Signed)
Message received from Triage. I returned patient call.  Call was difficult with less than ideal reception.   I spoke with the patient's son, Madison Berg. He states his mother has been having intermittent palpitations beginning Tues, but now more persistent and had fluttering all night last night.    Pt's son thinks that the plaquenil is causing her palpitations and they stopped plaquenil.   No SOB, no lightheadedness, dizziness, or pre-syncope, no chest pain.  BP 159/59, HR 80, Temp 96.8 this morning.  She is tolerating the rhythm well, with her only complaint being palpitations.  I advised that she can increase amiodarone to 200 mg BID for two weeks to see if that helps with palpitations. I am hesitant to add a beta blocker given her HR is in the 35s and her age. She is not on anticoagulation due to SDH.   Since we have not seen her in a year, I will schedule a video visit with the son and patient next Tues with Rosaria Ferries PAC. The son is amenable to VIDEO visit and has a smartphone. No active MyChart, I have not sent instructions or consent.  I will ask Triage to call the son with appt and reiterate the above instructions.   Tami Lin Madison Surber, PA-C 09/01/2018, 9:25 AM

## 2018-09-01 NOTE — Addendum Note (Signed)
Addended by: Fidel Levy on: 09/01/2018 10:55 AM   Modules accepted: Orders

## 2018-09-01 NOTE — Telephone Encounter (Signed)
° ° °  Patient's son  Leroy Sea calling to report his mother is complaining of heart flutter BP 159/59 HR 80  No other symptoms

## 2018-09-01 NOTE — Telephone Encounter (Addendum)
Spoke with son. Scheduled video visit for patient on 5/5 @ 1pm with Suanne Marker, [place]  Son states patient needs refills of amiodarone. Rx(s) sent to pharmacy electronically for updated dose

## 2018-09-04 ENCOUNTER — Telehealth: Payer: Self-pay | Admitting: Physician Assistant

## 2018-09-05 ENCOUNTER — Telehealth (INDEPENDENT_AMBULATORY_CARE_PROVIDER_SITE_OTHER): Payer: Medicare Other | Admitting: Physician Assistant

## 2018-09-05 ENCOUNTER — Encounter: Payer: Self-pay | Admitting: Physician Assistant

## 2018-09-05 VITALS — BP 134/54 | HR 51 | Ht 65.0 in | Wt 117.0 lb

## 2018-09-05 DIAGNOSIS — I480 Paroxysmal atrial fibrillation: Secondary | ICD-10-CM

## 2018-09-05 DIAGNOSIS — Z5309 Procedure and treatment not carried out because of other contraindication: Secondary | ICD-10-CM

## 2018-09-05 DIAGNOSIS — M069 Rheumatoid arthritis, unspecified: Secondary | ICD-10-CM

## 2018-09-05 NOTE — Progress Notes (Signed)
Virtual Visit via Video Note   This visit type was conducted due to national recommendations for restrictions regarding the COVID-19 Pandemic (e.g. social distancing) in an effort to limit this patient's exposure and mitigate transmission in our community.  Due to her co-morbid illnesses, this patient is at least at moderate risk for complications without adequate follow up.  This format is felt to be most appropriate for this patient at this time.  All issues noted in this document were discussed and addressed.  A limited physical exam was performed with this format.  Please refer to the patient's chart for her consent to telehealth for [facility].   Date:  09/05/2018   ID:  Madison Berg, DOB 05-May-1923, MRN [id number]  Patient Location: Home Provider Location: Home  PCP:  Deland Pretty, MD  Cardiologist:  Pixie Casino, MD 08/24/2017 Electrophysiologist:  None   Evaluation Performed:  Follow-Up Visit  Chief Complaint:  Palpitations  History of Present Illness:    Madison Berg is a 85 y.o. female with hx PAF on amiol, L-MCA & PCA CVA 2010, mod AI, nl EF echo, nl MV 2011, CHA2DS2-VASc = 6 (age x 2, female, CVA x 2, HTN) prev on warfarin but d/c'd due to [facility]  08/2017 office visit, in [place], doing well. On low-dose ASA 09/01/2018 phone notes regarding palpitations, appt made, concern for plaquenil causing them>>d/c'd, amio increased to 200 mg bid x 2 weeks and appt made  The patient does not have symptoms concerning for COVID-19 infection (fever, chills, cough, or new shortness of breath).   Her son is present with her at home, she has trouble with word-finding at times and he assists. He also helps manage her medicines.   Her symptoms began on 08/28/2018. She was having palpitations, described as her heart beating very hard. She did not have chest pain with this, but did not feel well. No SOB, no presyncope or syncope. The palpitations kept her from sleeping at times.   The palpitations lasted and they called on 05/01, spoke to A. Duke, PAC. Pt was advised to increase amio to 200 mg bid for 2 weeks and appt made.   After the palpitations started, her son researched side effects of medications and found out Plaquenil could cause palpitations and stopped it. The son reports that the Rheumatologist was not at all sure it would help her, so he did not mind stopping it. He is not sure that it was helping her.   Past Medical History:  Diagnosis Date  . Anxiety   . Aphasia as late effect of stroke 04/03/2014  . Carpal tunnel syndrome   . Cervical spondylosis   . Chronic kidney disease   . CVA (cerebral infarction) 04/2009   LMCA & posterior cerebral - excellent recovery, mild expressive aphasa  . Diverticulitis   . Gait disorder 04/03/2014  . History of nuclear stress test 06/2009   dipyridamole; negative, normal pattern of perfusion   . Hypertension   . Leg swelling   . Multiple lung nodules   . Nephrolithiasis   . OA (osteoarthritis)   . Osteopenia   . PAF (paroxysmal atrial fibrillation) (HCC)    a. not on anticoagulation due to history of chronic subdural hematomas  . Rheumatoid arthritis ([place])   . Valvular heart disease   . Vitamin D deficiency    Past Surgical History:  Procedure Laterality Date  . ABDOMINAL HYSTERECTOMY  1975  . APPENDECTOMY    . CATARACT EXTRACTION Bilateral  2013  . Instestinal Surgery  1987   r/t blockage  . NOSE SURGERY  1985   r/t deviated septum  . REPLACEMENT TOTAL KNEE Right 2004  . TRANSTHORACIC ECHOCARDIOGRAM  09/19/2012   EF 55-60% with normal systolic function; mod AR; MVP with mild-mod regurg; LA severely dilated; RV systolic pressure increased; mod dilated RA; RV systolic pressure increased - mild pulm HTN      Current Meds  Medication Sig  . amiodarone (PACERONE) 200 MG tablet Take 1 tablet (200 mg total) by mouth 2 (two) times daily. (Patient taking differently: Take 200 mg by mouth 2 (two) times daily. Take  bid x 1 week, then 200 mg qd starting)  . amLODipine (NORVASC) 10 MG tablet Take 10 mg by mouth at bedtime.   Marland Kitchen aspirin EC 81 MG tablet Take 81 mg by mouth daily.  . B Complex-C (B-COMPLEX WITH VITAMIN C) tablet Take 1 tablet by mouth daily.  . Cholecalciferol 1000 units tablet Take 1,000 Units by mouth daily.  Marland Kitchen gabapentin (NEURONTIN) 100 MG capsule TAKE 1 CAPSULE BY MOUTH THREE TIMES A DAY  . hydrALAZINE (APRESOLINE) 10 MG tablet Take 20 mg by mouth 2 (two) times daily.  . hydrALAZINE (APRESOLINE) 10 MG tablet Take 2 tablets (20 mg total) by mouth 2 (two) times daily.  Marland Kitchen levothyroxine (SYNTHROID, LEVOTHROID) 50 MCG tablet Take 50 mcg by mouth daily before breakfast.  . Multiple Vitamins-Minerals (MULTIVITAMIN & MINERAL PO) Take 1 tablet by mouth daily.  . nitroGLYCERIN (NITROSTAT) 0.4 MG SL tablet Place 1 tablet (0.4 mg total) under the tongue every 5 (five) minutes as needed for chest pain.  . Omega-3 Fatty Acids (FISH OIL PO) Take 1 tablet by mouth at bedtime.     Allergies:   Amitriptyline hcl; Atenolol; Azithromycin; Cefuroxime; Cephalexin; Daypro [oxaprozin]; Doxycycline; Famvir [famciclovir]; Hyzaar [losartan potassium-hctz]; Iodine; Metoprolol tartrate; Relafen [nabumetone]; and Sterapred [prednisone]   Social History   Tobacco Use  . Smoking status: Passive Smoke Exposure - Never Smoker  . Smokeless tobacco: Never Used  . Tobacco comment: Exposed at work, with her husband, & with her father.  Substance Use Topics  . Alcohol use: No    Alcohol/week: 0.0 standard drinks  . Drug use: No     Family Hx: The patient's family history includes Aneurysm in an other family member; Lymphoma in her mother; Tuberculosis in her brother and father. There is no history of Lung disease.  ROS:   Please see the history of present illness.    All other systems reviewed and are negative.   Prior CV studies:   The following studies were reviewed today:  ABI's 09/14/2017 Final  Interpretation: Right: Resting right ankle-brachial index is within normal range. No evidence of significant right lower extremity arterial disease. The right toe-brachial index is normal. RT great toe pressure = 117 mmHg. Left: Resting left ankle-brachial index is within normal range. No evidence of significant left lower extremity arterial disease. The left toe-brachial index is normal. LT Great toe pressure = 110 mmHg.   ECHO: 12/15/2014 - Left ventricle: The cavity size was normal. Wall thickness was   normal. Systolic function was normal. The estimated ejection   fraction was in the range of 55% to 60%. Wall motion was normal;   there were no regional wall motion abnormalities. - Aortic valve: There was trivial regurgitation. - Mitral valve: There was mild regurgitation. - Left atrium: The atrium was severely dilated. - Right atrium: The atrium was moderately to severely  dilated. - Tricuspid valve: There was mild-moderate regurgitation. - Pulmonary arteries: Systolic pressure was mildly increased. PA   peak pressure: 37 mm Hg (S).   Labs/Other Tests and Data Reviewed:    EKG:  An ECG dated 11/10/2017 was personally reviewed today and demonstrated:  SR, HR 72, no acute ischemic changes, 1st deg AVB noted  Recent Labs:  01/06/2018  Hgb 11.3 Cr 1.5 K+ 5.1 ALT 25  TSH 6.06  Recent Lipid Panel Lab Results  Component Value Date/Time   CHOL  01/06/2018    148          TRIG 92     HDL 67    CHOLHDL       LDLCALC       78    Wt Readings from Last 3 Encounters:  09/05/18 117 lb (53.1 kg)  02/08/18 118 lb 9.6 oz (53.8 kg)  09/14/17 115 lb 8 oz (52.4 kg)     Objective:    Vital Signs:  BP (!) 134/54   Pulse (!) 51   Ht 5\' 5"  (1.651 m)   Wt 117 lb (53.1 kg)   BMI 19.47 kg/m    VITAL SIGNS:  reviewed GEN:  no acute distress EYES:  sclerae anicteric, EOMI - Extraocular Movements Intact PSYCH:  normal affect  ASSESSMENT & PLAN:    1. Palpitations - likely her  atrial fib - she could feel it, but no real associated symptoms - sx got better off the Plaquenil and with higher dose amio - do not want her on higher dose amio any more than needed so will go back to 1 pill daily on Friday - Stay off the Plaquenil - need to keep her in SR, if possible, to decrease risk of another stroke - She may be in Afib now, as HR is low, but since cannot anticoagulate, cannot pursue additional methods to get her in SR. - Also, w/ bradycardia, no BB  2. Rheumatoid Arthritis - f/u w/ RA MD, stay off Plaquenil as MD was not sure it would help and pt does not seem to have had great response  3. Anticoagulation Contraindicated:  - not a candidate for systemic anticoag due to hx SDH - continue baby ASA   COVID-19 Education: The signs and symptoms of COVID-19 were discussed with the patient and how to seek care for testing (follow up with PCP or arrange E-visit). The importance of social distancing was discussed today.  Time:   Today, I have spent 23 minutes with the patient with telehealth technology discussing the above problems.     Medication Adjustments/Labs and Tests Ordered: Current medicines are reviewed at length with the patient today.  Concerns regarding medicines are outlined above.   Tests Ordered: No orders of the defined types were placed in this encounter.   Medication Changes: No orders of the defined types were placed in this encounter.   Disposition:  Follow up with Dr Debara Pickett  Signed, Rosaria Ferries, PA-C  09/05/2018 1:06 PM    [place]

## 2018-09-05 NOTE — Patient Instructions (Addendum)
Medication Instructions:   Take Amiodarone 200 mg--1 tablet daily as of Friday, 09/01/18.  STOP Plaquenil  If you need a refill on your cardiac medications before your next appointment, please call your pharmacy.    Follow-Up: At [facility], you and your health needs are our priority.  As part of our continuing mission to provide you with exceptional heart care, we have created designated Provider Care Teams.  These Care Teams include your primary Cardiologist (physician) and Advanced Practice Providers (APPs -  Physician Assistants and Nurse Practitioners) who all work together to provide you with the care you need, when you need it.  You have been scheduled for a follow-up appointment with Dr. Debara Pickett on Thursday, 12/21/18 at 2:00 PM.

## 2018-10-02 ENCOUNTER — Telehealth: Payer: Self-pay | Admitting: *Deleted

## 2018-10-02 NOTE — Telephone Encounter (Signed)
Due to current COVID 19 pandemic, our office is severely reducing in office visits until further notice, in order to minimize the risk to our patients and healthcare providers.  Pt understands that although there may be some limitations with this type of visit, we will take all precautions to reduce any security or privacy concerns.  Pt understands that this will be treated like an in office visit and we will file with pt's insurance, and there may be a patient responsible charge related to this service.  They have no access for camera per pt or son.  Consented to telephone visit.

## 2018-10-05 ENCOUNTER — Ambulatory Visit (INDEPENDENT_AMBULATORY_CARE_PROVIDER_SITE_OTHER): Payer: Medicare Other | Admitting: Adult Health

## 2018-10-05 ENCOUNTER — Other Ambulatory Visit: Payer: Self-pay

## 2018-10-05 ENCOUNTER — Encounter: Payer: Self-pay | Admitting: Adult Health

## 2018-10-05 DIAGNOSIS — G629 Polyneuropathy, unspecified: Secondary | ICD-10-CM

## 2018-10-05 DIAGNOSIS — R269 Unspecified abnormalities of gait and mobility: Secondary | ICD-10-CM

## 2018-10-05 NOTE — Progress Notes (Signed)
  [facility] [address] [place]. [place] [address] [phone]     Virtual Visit via Telephone Note  I connected with Madison Berg on 10/05/18 at  3:00 PM EDT by telephone located remotely at [facility] and verified that I am speaking with the correct person using two identifiers who reports being located at home.    Visit scheduled by RN. She discussed the limitations, risks, security and privacy concerns of performing an evaluation and management service by telephone and the availability of in person appointments. I also discussed with the patient that there may be a patient responsible charge related to this service. The patient expressed understanding and agreed to proceed. See telephone note for consent and additional scheduling information.    History of Present Illness:  Madison Berg is a 85 y.o. female who has been followed in this office for neuropathy.  She was initially scheduled for face-to-face office follow up visit today time but due to [place], visit rescheduled for non-face-to-face telephone visit with patients consent. Unable to participate in video visit due to lack of access to device with camera.    Today 10/05/18 Madison Berg is a 85 year old female with a history of peripheral neuropathy and gait disorder.  She reports overall she has done well.  She continues on gabapentin 100 mg 3 times a day.  She states that this works well for her neuropathy.  She denies any significant changes with her gait or balance.  She reports that she has had strokes in the past which has affected her ability to speak.  She denies any new issues.  HISTORY: 02/08/18:  Madison Berg is a 85 year old female with a history of peripheral neuropathy and gait disorder.  She returns today for follow-up.  She reports that gabapentin has given her benefit in regards to her neuropathy.  She states that her legs still feel cold but she does not have  a lot of discomfort.  She states that her last fall was approximately 3 months ago.  Fortunately she did not sustain any injuries.  She states that if she stands for a long time her legs does hurt.  She also states that at times her legs become very restless.  She denies any new symptoms.  She returns today for evaluation.    Observations/Objective:  Generalized: Well developed, in no acute distress   Neurological examination  Mentation: Alert oriented to time, place, history taking. speech and language fluent  Assessment and Plan:  1: Peripheral neuropathy 2.  Gait disorder  Overall the patient is doing well.  She will continue on gabapentin 100 mg 3 times a day.  If her symptoms worsen or she develops new symptoms she should let us know.  She will follow-up in 6 months or sooner if needed.  Follow Up Instructions:    Follow-up in 6 months  I discussed the assessment and treatment plan with the patient.  The patient was provided an opportunity to ask questions and all were answered to their satisfaction. The patient agreed with the plan and verbalized an understanding of the instructions.   I provided 15 minutes of non-face-to-face time during this encounter.    Ward Givens NP-C  [facility] [address] [place], [place] [address]  Phone [phone] Fax [phone]

## 2018-10-30 NOTE — Telephone Encounter (Signed)
Opened in error

## 2018-12-21 ENCOUNTER — Ambulatory Visit: Payer: Medicare Other | Admitting: Internal Medicine

## 2019-01-19 ENCOUNTER — Other Ambulatory Visit: Payer: Self-pay | Admitting: Adult Health

## 2019-01-23 ENCOUNTER — Other Ambulatory Visit: Payer: Self-pay

## 2019-01-23 MED ORDER — HYDRALAZINE HCL 10 MG PO TABS: 20.0000 mg | ORAL_TABLET | Freq: Two times a day (BID) | ORAL | 2 refills | Status: AC

## 2019-01-29 ENCOUNTER — Other Ambulatory Visit: Payer: Self-pay

## 2019-01-29 ENCOUNTER — Encounter: Payer: Self-pay | Admitting: General Practice

## 2019-01-29 ENCOUNTER — Ambulatory Visit (INDEPENDENT_AMBULATORY_CARE_PROVIDER_SITE_OTHER): Payer: Medicare Other | Admitting: General Practice

## 2019-01-29 VITALS — BP 132/66 | HR 70 | Temp 98.2°F | Ht 61.0 in | Wt 113.0 lb

## 2019-01-29 DIAGNOSIS — I10 Essential (primary) hypertension: Secondary | ICD-10-CM

## 2019-01-29 DIAGNOSIS — R002 Palpitations: Secondary | ICD-10-CM

## 2019-01-29 DIAGNOSIS — R0789 Other chest pain: Secondary | ICD-10-CM | POA: Diagnosis not present

## 2019-01-29 DIAGNOSIS — I480 Paroxysmal atrial fibrillation: Secondary | ICD-10-CM

## 2019-01-29 MED ORDER — AMIODARONE HCL 200 MG PO TABS: 200.0000 mg | ORAL_TABLET | Freq: Every day | ORAL | 1 refills | Status: DC

## 2019-01-29 NOTE — Progress Notes (Signed)
Cardiology Clinic Note   Patient Name: Madison Berg Date of Encounter: 01/29/2019  Primary Care Provider:  Deland Pretty, MD Primary Cardiologist:  Pixie Casino, MD  Patient Profile    Madison Berg 85 year old female presents today for an evaluation of her chest pain.  Past Medical History    Past Medical History:  Diagnosis Date  . Anxiety   . Aphasia as late effect of stroke 04/03/2014  . Carpal tunnel syndrome   . Cervical spondylosis   . Chronic kidney disease   . CVA (cerebral infarction) 04/2009   LMCA & posterior cerebral - excellent recovery, mild expressive aphasa  . Diverticulitis   . Gait disorder 04/03/2014  . History of nuclear stress test 06/2009   dipyridamole; negative, normal pattern of perfusion   . Hypertension   . Leg swelling   . Multiple lung nodules   . Nephrolithiasis   . OA (osteoarthritis)   . Osteopenia   . PAF (paroxysmal atrial fibrillation) (HCC)    a. not on anticoagulation due to history of chronic subdural hematomas  . Rheumatoid arthritis ([place])   . Valvular heart disease   . Vitamin D deficiency    Past Surgical History:  Procedure Laterality Date  . ABDOMINAL HYSTERECTOMY  1975  . APPENDECTOMY    . CATARACT EXTRACTION Bilateral 2013  . Instestinal Surgery  1987   r/t blockage  . NOSE SURGERY  1985   r/t deviated septum  . REPLACEMENT TOTAL KNEE Right 2004  . TRANSTHORACIC ECHOCARDIOGRAM  09/19/2012   EF 55-60% with normal systolic function; mod AR; MVP with mild-mod regurg; LA severely dilated; RV systolic pressure increased; mod dilated RA; RV systolic pressure increased - mild pulm HTN     Allergies  Allergies  Allergen Reactions  . Amitriptyline Hcl Itching  . Atenolol Other (See Comments)    Hair loss, thin nails, itching  . Azithromycin Other (See Comments)    Mouth sores; sore throat  . Cefuroxime Other (See Comments)    unknown  . Cephalexin Nausea And Vomiting  . Daypro [Oxaprozin] Other (See  Comments)    dyspepsia  . Doxycycline Other (See Comments)    Mouth sores; sore throat  . Famvir [Famciclovir] Other (See Comments)    unknown  . Hyzaar [Losartan Potassium-Hctz] Other (See Comments)    Headache; tight face  . Iodine Other (See Comments)    Involuntary muscle contractions  . Metoprolol Tartrate Itching  . Relafen [Nabumetone] Other (See Comments)    Mouth sores  . Sterapred [Prednisone] Other (See Comments)    weakness    History of Present Illness    [name] was last seen by Rosaria Ferries, PA-C on 09/05/2018 via virtual platform.  During that time she had been noticing palpitations her amiodarone dose has been increased and her rheumatologist stopped her Plaquenil.  A plan to resume 200 mg amiodarone was made.  She is not a candidate for anticoagulation due to a history of subdural hematoma.  Recently she contacted our office with complaints of chest pain and presents for an evaluation today.  Her PMH includes essential hypertension, paroxysmal atrial fibrillation, aortic valve regurgitation, stroke, postural dizziness with presyncope, PVD, acute on chronic intracranial subdural hematoma, acute on chronic kidney injury, gait disorder, and bradycardia.  She presents to the clinic today and states about 2 weeks ago she had one episode of chest pain that woke her up at night and lasted for about 40 to 45 minutes before subsided  on its own.  She has not had any further chest pain.  She goes on to say that she has trouble with speaking and fine motor tasks since her 2 strokes.  She also states that she continued to take 300 rather than 200 of amiodarone daily since her last visit.  She has not noticed any further palpitations.  She denies chest pain, shortness of breath, lower extremity edema, fatigue, palpitations, melena, hematuria, hemoptysis, diaphoresis, weakness, presyncope, syncope, orthopnea, and PND.   Home Medications    Prior to Admission medications    Medication Sig Start Date End Date Taking? Authorizing Provider  amiodarone (PACERONE) 200 MG tablet Take 1 tablet (200 mg total) by mouth 2 (two) times daily. Patient taking differently: Take 200 mg by mouth 2 (two) times daily. Take bid x 1 week, then 200 mg qd starting 09/01/18   Ledora Bottcher, PA  amLODipine (NORVASC) 10 MG tablet Take 10 mg by mouth at bedtime.     [provider]  aspirin EC 81 MG tablet Take 81 mg by mouth daily.    [provider]  B Complex-C (B-COMPLEX WITH VITAMIN C) tablet Take 1 tablet by mouth daily.    [provider]  Cholecalciferol 1000 units tablet Take 1,000 Units by mouth daily.    [provider]  gabapentin (NEURONTIN) 100 MG capsule TAKE 1 CAPSULE BY MOUTH THREE TIMES A DAY 01/23/19   Kathrynn Ducking, MD  hydrALAZINE (APRESOLINE) 10 MG tablet Take 2 tablets (20 mg total) by mouth 2 (two) times daily. 01/23/19   Almyra Deforest, PA  levothyroxine (SYNTHROID, LEVOTHROID) 50 MCG tablet Take 50 mcg by mouth daily before breakfast.    [provider]  Multiple Vitamins-Minerals (MULTIVITAMIN & MINERAL PO) Take 1 tablet by mouth daily.    [provider]  nitroGLYCERIN (NITROSTAT) 0.4 MG SL tablet Place 1 tablet (0.4 mg total) under the tongue every 5 (five) minutes as needed for chest pain. 07/02/15   Hilty, Nadean Corwin, MD  Omega-3 Fatty Acids (FISH OIL PO) Take 1 tablet by mouth at bedtime.    [provider]    Family History    Family History  Problem Relation Age of Onset  . Lymphoma Mother   . Tuberculosis Father   . Tuberculosis Brother   . Aneurysm Other        grandmother, uncles, cousins  . Lung disease Neg Hx    She indicated that her mother is deceased. She indicated that her father is deceased. She indicated that her brother is deceased. She indicated that the status of her neg hx is unknown. She indicated that the status of her other is unknown.  Social History    Social History    Socioeconomic History  . Marital status: Widowed    Spouse name: Not on file  . Number of children: 4  . Years of education: Not on file  . Highest education level: Not on file  Occupational History    Comment: Retired  Scientific laboratory technician  . Financial resource strain: Not on file  . Food insecurity    Worry: Not on file    Inability: Not on file  . Transportation needs    Medical: Not on file    Non-medical: Not on file  Tobacco Use  . Smoking status: Passive Smoke Exposure - Never Smoker  . Smokeless tobacco: Never Used  . Tobacco comment: Exposed at work, with her husband, & with her father.  Substance  and Sexual Activity  . Alcohol use: No    Alcohol/week: 0.0 standard drinks  . Drug use: No  . Sexual activity: Not on file  Lifestyle  . Physical activity    Days per week: Not on file    Minutes per session: Not on file  . Stress: Not on file  Relationships  . Social Herbalist on phone: Not on file    Gets together: Not on file    Attends religious service: Not on file    Active member of club or organization: Not on file    Attends meetings of clubs or organizations: Not on file    Relationship status: Not on file  . Intimate partner violence    Fear of current or ex partner: Not on file    Emotionally abused: Not on file    Physically abused: Not on file    Forced sexual activity: Not on file  Other Topics Concern  . Not on file  Social History Narrative   Patient is right handed.   No caffeine   Live at home with two sons   Education one year of college.   Retired.      [facility]:   Originally from [place] [place]. She has always lived in [place]. No international travel. She does have prior travel to [facility]. 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 They have no pets currently. No bird exposure. She does have one plant indoor currently. Does have carpet extensively throughout the home. No mold exposure.      Review of Systems    General:  No  chills, fever, night sweats or weight changes.  Cardiovascular:  No chest pain, dyspnea on exertion, edema, orthopnea, palpitations, paroxysmal nocturnal dyspnea. Dermatological: No rash, lesions/masses Respiratory: No cough, dyspnea Urologic: No hematuria, dysuria Abdominal:   No nausea, vomiting, diarrhea, bright red blood per rectum, melena, or hematemesis Neurologic:  No visual changes, wkns, changes in mental status. All other systems reviewed and are otherwise negative except as noted above.  Physical Exam    VS:  BP 132/66   Pulse 70   Temp 98.2 F (36.8 C) (Temporal)   Ht 5\' 1"  (1.549 m)   Wt 113 lb (51.3 kg)   SpO2 96%   BMI 21.35 kg/m  , BMI Body mass index is 21.35 kg/m. GEN: Well nourished, well developed, in no acute distress. HEENT: normal. Neck: Supple, no JVD, carotid bruits, or masses. Cardiac: RRR, no murmurs, rubs, or gallops. No clubbing, cyanosis, edema.  Radials/DP/PT 2+ and equal bilaterally.  Respiratory:  Respirations regular and unlabored, clear to auscultation bilaterally. GI: Soft, nontender, nondistended, BS + x 4. MS: no deformity or atrophy. Skin: warm and dry, no rash. Neuro:  Strength and sensation are intact. Psych: Normal affect.  Accessory Clinical Findings    ECG personally reviewed by me today-atrial flutter nonspecific T wave abnormalities 70 bpm- No acute changes  EKG 11/10/2017 Sinus rhythm with first-degree AV block 62 bpm  Echocardiogram 12/15/2014 Study Conclusions  - Left ventricle: The cavity size was normal. Wall thickness was   normal. Systolic function was normal. The estimated ejection   fraction was in the range of 55% to 60%. Wall motion was normal;   there were no regional wall motion abnormalities. - Aortic valve: There was trivial regurgitation. - Mitral valve: There was mild regurgitation. - Left atrium: The atrium was severely dilated. - Right atrium: The atrium was moderately to severely dilated. -  Tricuspid  valve: There was mild-moderate regurgitation. - Pulmonary arteries: Systolic pressure was mildly increased. PA   peak pressure: 37 mm Hg (S).   Assessment & Plan    Chest discomfort-no chest pain today.  Patient states she had one episode of chest pain that woke her up from her sleep about 2 weeks ago.  The pain lasted 40 to 45 minutes and subsided on its own she did not take any nitroglycerin and has not had any further episodes of chest discomfort.  EKG today shows atrial flutter 70 bpm. Continue 81 mg aspirin daily  Continue 0.4 mg sublingual tablet as needed  Essential hypertension-BP today 132/66 Continue hydralazine 20 mg twice daily Decrease amiodarone to 200 mg daily Continue amlodipine 10 mg tablet daily Heart healthy low-sodium diet Increase physical activity as tolerated  Paroxysmal atrial fibrillation-EKG today EKG today shows atrial flutter 70 bpm. Continue amiodarone 200 mg daily Continue 81 mg aspirin daily   Palpitations- states she has not had recurrent episodes of palpitations since her last visit. Continue amiodarone 200 mg daily Do not drink caffeinated beverages or chocolate.  Anticoagulation contraindicated- history of SDH Continue 81 mg aspirin daily   Disposition: Follow-up with Dr. Debara Pickett in 3 months.  Deberah Pelton, NP-C 01/29/2019, 3:12 PM

## 2019-01-29 NOTE — Patient Instructions (Signed)
Medication Instructions:  DECREASE Amiodarone to 200mg  Take 1 tablet once a day  If you need a refill on your cardiac medications before your next appointment, please call your pharmacy.   Lab work: None  If you have labs (blood work) drawn today and your tests are completely normal, you will receive your results only by: Marland Kitchen MyChart Message (if you have MyChart) OR . A paper copy in the mail If you have any lab test that is abnormal or we need to change your treatment, we will call you to review the results.  Testing/Procedures: None   Follow-Up: At [facility], you and your health needs are our priority.  As part of our continuing mission to provide you with exceptional heart care, we have created designated Provider Care Teams.  These Care Teams include your primary Cardiologist (physician) and Advanced Practice Providers (APPs -  Physician Assistants and Nurse Practitioners) who all work together to provide you with the care you need, when you need it. You will need a follow up appointment in 3 months.  You may see Pixie Casino, MD or one of the following Advanced Practice Providers on your designated Care Team: [place], [place] . Fabian Sharp, PA-C  Any Other Special Instructions Will Be Listed Below (If Applicable).

## 2019-02-28 ENCOUNTER — Other Ambulatory Visit: Payer: Self-pay | Admitting: Surgery

## 2019-03-05 ENCOUNTER — Other Ambulatory Visit: Payer: Self-pay | Admitting: Surgery

## 2019-03-05 DIAGNOSIS — R1031 Right lower quadrant pain: Secondary | ICD-10-CM

## 2019-03-09 ENCOUNTER — Other Ambulatory Visit: Payer: Self-pay | Admitting: Surgery

## 2019-03-09 ENCOUNTER — Ambulatory Visit
Admission: RE | Admit: 2019-03-09 | Discharge: 2019-03-09 | Disposition: A | Discharge status: Home / Self Care | Payer: Medicare Other | Source: Ambulatory Visit | Attending: Surgery | Admitting: Surgery

## 2019-03-09 DIAGNOSIS — R1031 Right lower quadrant pain: Secondary | ICD-10-CM

## 2019-03-28 ENCOUNTER — Other Ambulatory Visit: Payer: Self-pay | Admitting: Cardiology

## 2019-04-11 ENCOUNTER — Ambulatory Visit (INDEPENDENT_AMBULATORY_CARE_PROVIDER_SITE_OTHER): Payer: Medicare Other | Admitting: Adult Health

## 2019-04-11 ENCOUNTER — Other Ambulatory Visit: Payer: Self-pay

## 2019-04-11 ENCOUNTER — Encounter: Payer: Self-pay | Admitting: Adult Health

## 2019-04-11 VITALS — BP 120/60 | HR 47 | Temp 97.1°F | Ht 61.0 in | Wt 122.2 lb

## 2019-04-11 DIAGNOSIS — G629 Polyneuropathy, unspecified: Secondary | ICD-10-CM | POA: Diagnosis not present

## 2019-04-11 MED ORDER — GABAPENTIN 100 MG PO CAPS: 100.0000 mg | ORAL_CAPSULE | Freq: Two times a day (BID) | ORAL | 5 refills | Status: AC

## 2019-04-11 NOTE — Patient Instructions (Signed)
Your Plan:  Continue gabapentin twice a day If your symptoms worsen or you develop new symptoms please let us know.   Thank you for coming to see [place] at [facility]. I hope we have been able to provide you high quality care today.  You may receive a patient satisfaction survey over the next few weeks. We would appreciate your feedback and comments so that we may continue to improve ourselves and the health of our patients.

## 2019-04-11 NOTE — Progress Notes (Signed)
I have read the note, and I agree with the clinical assessment and plan.  Madison Berg   

## 2019-04-11 NOTE — Progress Notes (Signed)
PATIENT: Madison Berg DOB: Mar 08, 1924  REASON FOR VISIT: follow up HISTORY FROM: patient  HISTORY OF PRESENT ILLNESS: Today 04/11/19:  Madison Berg is a 85 year old female with a history of neuropathy.  She returns today for follow-up.  She states that she has decreased gabapentin to twice a day.  She occasionally has burning and tingling in the feet.  She lives with her 2 sons.  They help manage her medication.  Her son reports that she has been taking nitroglycerin inappropriately.  He states that she will take it when she has weakness rather than chest pain.  Patient reports that she took 2 nitroglycerin tablets before the visit today.  She denies chest pain.  Son reports that she has had ongoing problems with her short-term memory.  She has had speech difficulty after a stroke several years ago.  He feels that the speech has gotten slightly worse over time.  The patient recently had a fall while trying to make up her bed.  She has an abrasion on the right upper arm.  She uses a walker.  Her son reports that she typically has falls if she is not using her walker.  Patient returns today for an evaluation.    REVIEW OF SYSTEMS: Out of a complete 14 system review of symptoms, the patient complains only of the following symptoms, and all other reviewed systems are negative.  See HPI  ALLERGIES: Allergies  Allergen Reactions  . Amitriptyline Hcl Itching  . Atenolol Other (See Comments)    Hair loss, thin nails, itching  . Azithromycin Other (See Comments)    Mouth sores; sore throat  . Cefuroxime Other (See Comments)    unknown  . Cephalexin Nausea And Vomiting  . Daypro [Oxaprozin] Other (See Comments)    dyspepsia  . Doxycycline Other (See Comments)    Mouth sores; sore throat  . Famvir [Famciclovir] Other (See Comments)    unknown  . Hyzaar [Losartan Potassium-Hctz] Other (See Comments)    Headache; tight face  . Iodine Other (See Comments)    Involuntary muscle  contractions  . Metoprolol Tartrate Itching  . Relafen [Nabumetone] Other (See Comments)    Mouth sores  . Sterapred [Prednisone] Other (See Comments)    weakness    HOME MEDICATIONS: Outpatient Medications Prior to Visit  Medication Sig Dispense Refill  . amiodarone (PACERONE) 200 MG tablet TAKE 1 TABLET BY MOUTH EVERY DAY 90 tablet 1  . amLODipine (NORVASC) 10 MG tablet Take 5 mg by mouth at bedtime. 5mg  at bedtime    . aspirin EC 81 MG tablet Take 81 mg by mouth daily.    . B Complex-C (B-COMPLEX WITH VITAMIN C) tablet Take 1 tablet by mouth daily.    . Cholecalciferol 1000 units tablet Take 1,000 Units by mouth daily.    Marland Kitchen gabapentin (NEURONTIN) 100 MG capsule TAKE 1 CAPSULE BY MOUTH THREE TIMES A DAY 90 capsule 3  . hydrALAZINE (APRESOLINE) 10 MG tablet Take 2 tablets (20 mg total) by mouth 2 (two) times daily. 360 tablet 2  . Levothyroxine Sodium 75 MCG/ML SOLN Take 75 mcg by mouth daily before breakfast.     . Multiple Vitamins-Minerals (MULTIVITAMIN & MINERAL PO) Take 1 tablet by mouth daily.    . nitroGLYCERIN (NITROSTAT) 0.4 MG SL tablet Place 1 tablet (0.4 mg total) under the tongue every 5 (five) minutes as needed for chest pain. 25 tablet 3  . Omega-3 Fatty Acids (FISH OIL PO) Take 1 tablet  by mouth at bedtime.     No facility-administered medications prior to visit.     PAST MEDICAL HISTORY: Past Medical History:  Diagnosis Date  . Anxiety   . Aphasia as late effect of stroke 04/03/2014  . Carpal tunnel syndrome   . Cervical spondylosis   . Chronic kidney disease   . CVA (cerebral infarction) 04/2009   LMCA & posterior cerebral - excellent recovery, mild expressive aphasa  . Diverticulitis   . Gait disorder 04/03/2014  . History of nuclear stress test 06/2009   dipyridamole; negative, normal pattern of perfusion   . Hypertension   . Leg swelling   . Multiple lung nodules   . Nephrolithiasis   . OA (osteoarthritis)   . Osteopenia   . PAF (paroxysmal atrial  fibrillation) (HCC)    a. not on anticoagulation due to history of chronic subdural hematomas  . Rheumatoid arthritis ([place])   . Valvular heart disease   . Vitamin D deficiency     PAST SURGICAL HISTORY: Past Surgical History:  Procedure Laterality Date  . ABDOMINAL HYSTERECTOMY  1975  . APPENDECTOMY    . CATARACT EXTRACTION Bilateral 2013  . Instestinal Surgery  1987   r/t blockage  . NOSE SURGERY  1985   r/t deviated septum  . REPLACEMENT TOTAL KNEE Right 2004  . TRANSTHORACIC ECHOCARDIOGRAM  09/19/2012   EF 55-60% with normal systolic function; mod AR; MVP with mild-mod regurg; LA severely dilated; RV systolic pressure increased; mod dilated RA; RV systolic pressure increased - mild pulm HTN     FAMILY HISTORY: Family History  Problem Relation Age of Onset  . Lymphoma Mother   . Tuberculosis Father   . Tuberculosis Brother   . Aneurysm Other        grandmother, uncles, cousins  . Lung disease Neg Hx     SOCIAL HISTORY: Social History   Socioeconomic History  . Marital status: Widowed    Spouse name: Not on file  . Number of children: 4  . Years of education: Not on file  . Highest education level: Not on file  Occupational History    Comment: Retired  Scientific laboratory technician  . Financial resource strain: Not on file  . Food insecurity    Worry: Not on file    Inability: Not on file  . Transportation needs    Medical: Not on file    Non-medical: Not on file  Tobacco Use  . Smoking status: Passive Smoke Exposure - Never Smoker  . Smokeless tobacco: Never Used  . Tobacco comment: Exposed at work, with her husband, & with her father.  Substance and Sexual Activity  . Alcohol use: No    Alcohol/week: 0.0 standard drinks  . Drug use: No  . Sexual activity: Not on file  Lifestyle  . Physical activity    Days per week: Not on file    Minutes per session: Not on file  . Stress: Not on file  Relationships  . Social Herbalist on phone: Not on file    Gets  together: Not on file    Attends religious service: Not on file    Active member of club or organization: Not on file    Attends meetings of clubs or organizations: Not on file    Relationship status: Not on file  . Intimate partner violence    Fear of current or ex partner: Not on file    Emotionally abused: Not on file  Physically abused: Not on file    Forced sexual activity: Not on file  Other Topics Concern  . Not on file  Social History Narrative   Patient is right handed.   No caffeine   Live at home with two sons   Education one year of college.   Retired.       Pulmonary:   Originally from [facility]. She has always lived in [place]. No international travel. She does have prior travel to [facility]. 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 They have no pets currently. No bird exposure. She does have one plant indoor currently. Does have carpet extensively throughout the home. No mold exposure.       PHYSICAL EXAM  Vitals:   04/11/19 1256  BP: (!) 88/52  Pulse: (!) 41  Temp: (!) 97.1 F (36.2 C)  Weight: 122 lb 3.2 oz (55.4 kg)  Height: 5\' 1"  (1.549 m)   Body mass index is 23.09 kg/m.  Generalized: Well developed, in no acute distress  Skin: Abrasion on right upper arm.  Neurological examination  Mentation: Alert oriented to time, place, history taking. Follows all commands speech and language fluent Cranial nerve II-XII: Pupils were equal round reactive to light.  Facial sensation and strength were normal. Uvula tongue midline. Head turning and shoulder shrug  were normal and symmetric. Motor: The motor testing reveals 5 over 5 strength of all 4 extremities. Good symmetric motor tone is noted throughout.  Sensory: Sensory testing is intact to soft touch on all 4 extremities. No evidence of extinction is noted.  Coordination: Cerebellar testing reveals good finger-nose-finger and heel-to-shin bilaterally.  Gait and station: Patient uses a walker when  ambulating. Marland Kitchen   DIAGNOSTIC DATA (LABS, IMAGING, TESTING) - I reviewed patient records, labs, notes, testing and imaging myself where available.  Lab Results  Component Value Date   WBC 8.5 07/18/2017   HGB 12.6 07/18/2017   HCT 38.7 07/18/2017   MCV 98.2 07/18/2017   PLT 230 07/18/2017      Component Value Date/Time   NA 139 07/18/2017 1145   K 4.4 07/18/2017 1145   CL 106 07/18/2017 1145   CO2 22 07/18/2017 1145   GLUCOSE 131 (H) 07/18/2017 1145   BUN 35 (H) 07/18/2017 1145   CREATININE 1.68 (H) 07/18/2017 1145   CALCIUM 9.3 07/18/2017 1145   PROT 6.7 12/05/2016 0119   PROT 7.0 08/05/2014 1430   ALBUMIN 3.8 12/05/2016 0119   AST 59 (H) 12/05/2016 0119   ALT 68 (H) 12/05/2016 0119   ALKPHOS 103 12/05/2016 0119   BILITOT 1.3 (H) 12/05/2016 0119   GFRNONAA 26 (L) 07/18/2017 1145   GFRAA 30 (L) 07/18/2017 1145   Lab Results  Component Value Date   CHOL  03/24/2009    158        ATP III CLASSIFICATION:  <200     mg/dL   Desirable  200-239  mg/dL   Borderline High  >=240    mg/dL   High          HDL 60 03/24/2009   LDLCALC  03/24/2009    86        Total Cholesterol/HDL:CHD Risk Coronary Heart Disease Risk Table                     Men   Women  1/2 Average Risk   3.4   3.3  Average Risk       5.0   4.4  2 X Average Risk   9.6   7.1  3 X Average Risk  23.4   11.0        Use the calculated Patient Ratio above and the CHD Risk Table to determine the patient's CHD Risk.        ATP III CLASSIFICATION (LDL):  <100     mg/dL   Optimal  100-129  mg/dL   Near or Above                    Optimal  130-159  mg/dL   Borderline  160-189  mg/dL   High  >190     mg/dL   Very High   TRIG 62 03/24/2009   CHOLHDL 2.6 03/24/2009   Lab Results  Component Value Date   HGBA1C  03/23/2009    5.6 (NOTE) The ADA recommends the following therapeutic goal for glycemic control related to Hgb A1c measurement: Goal of therapy: <6.5 Hgb A1c  Reference: American Diabetes Association:  Clinical Practice Recommendations 2010, Diabetes Care, 2010, 33: (Suppl  1).   Lab Results  Component Value Date   [id number] 08/05/2014   Lab Results  Component Value Date   TSH 4.290 12/05/2016      ASSESSMENT AND PLAN 85 y.o. year old female  has a past medical history of Anxiety, Aphasia as late effect of stroke (04/03/2014), Carpal tunnel syndrome, Cervical spondylosis, Chronic kidney disease, CVA (cerebral infarction) (04/2009), Diverticulitis, Gait disorder (04/03/2014), History of nuclear stress test (06/2009), Hypertension, Leg swelling, Multiple lung nodules, Nephrolithiasis, OA (osteoarthritis), Osteopenia, PAF (paroxysmal atrial fibrillation) ([place]), Rheumatoid arthritis ([place]), Valvular heart disease, and Vitamin D deficiency. here with :  1.  Neuropathy  The patient will continue on gabapentin 100 mg twice a day.  I have advised that the son should manage her nitroglycerin tablets.  She should only take if she is having chest pain.  Patient and her son voiced understanding.  The patient is encouraged to use a walker at all times.  We also discussed having assistance in the home.  Patient and her son are advised that if symptoms worsen or she develops new symptoms they should let us know.  She will follow-up in 6 months or sooner if needed.  I spent 15 minutes with the patient. 50% of this time was spent reviewing plan of care   Ward Givens, MSN, NP-C 04/11/2019, 1:06 PM [facility] [address] [place], [place] [address] [phone]

## 2019-04-14 ENCOUNTER — Encounter (HOSPITAL_COMMUNITY): Admission: EM | Disposition: E | Payer: Self-pay | Source: Home / Self Care | Attending: Cardiovascular Disease

## 2019-04-14 ENCOUNTER — Encounter (HOSPITAL_COMMUNITY): Payer: Self-pay | Admitting: Emergency Medicine

## 2019-04-14 ENCOUNTER — Other Ambulatory Visit: Payer: Self-pay

## 2019-04-14 ENCOUNTER — Emergency Department (HOSPITAL_COMMUNITY): Payer: Medicare Other

## 2019-04-14 ENCOUNTER — Inpatient Hospital Stay (HOSPITAL_COMMUNITY)
Admission: EM | Admit: 2019-04-14 | Discharge: 2019-05-04 | DRG: 260 | Disposition: E | Payer: Medicare Other | Attending: Cardiovascular Disease | Admitting: Cardiovascular Disease

## 2019-04-14 DIAGNOSIS — Z66 Do not resuscitate: Secondary | ICD-10-CM | POA: Diagnosis not present

## 2019-04-14 DIAGNOSIS — Z20828 Contact with and (suspected) exposure to other viral communicable diseases: Secondary | ICD-10-CM | POA: Diagnosis present

## 2019-04-14 DIAGNOSIS — I442 Atrioventricular block, complete: Secondary | ICD-10-CM

## 2019-04-14 DIAGNOSIS — R570 Cardiogenic shock: Secondary | ICD-10-CM | POA: Diagnosis present

## 2019-04-14 DIAGNOSIS — R6510 Systemic inflammatory response syndrome (SIRS) of non-infectious origin without acute organ dysfunction: Secondary | ICD-10-CM | POA: Diagnosis present

## 2019-04-14 DIAGNOSIS — I351 Nonrheumatic aortic (valve) insufficiency: Secondary | ICD-10-CM | POA: Diagnosis present

## 2019-04-14 DIAGNOSIS — I69320 Aphasia following cerebral infarction: Secondary | ICD-10-CM

## 2019-04-14 DIAGNOSIS — I482 Chronic atrial fibrillation, unspecified: Secondary | ICD-10-CM | POA: Diagnosis present

## 2019-04-14 DIAGNOSIS — E875 Hyperkalemia: Secondary | ICD-10-CM | POA: Diagnosis present

## 2019-04-14 DIAGNOSIS — R34 Anuria and oliguria: Secondary | ICD-10-CM

## 2019-04-14 DIAGNOSIS — Z96651 Presence of right artificial knee joint: Secondary | ICD-10-CM | POA: Diagnosis present

## 2019-04-14 DIAGNOSIS — I130 Hypertensive heart and chronic kidney disease with heart failure and stage 1 through stage 4 chronic kidney disease, or unspecified chronic kidney disease: Secondary | ICD-10-CM | POA: Diagnosis present

## 2019-04-14 DIAGNOSIS — I6203 Nontraumatic chronic subdural hemorrhage: Secondary | ICD-10-CM | POA: Diagnosis present

## 2019-04-14 DIAGNOSIS — Z515 Encounter for palliative care: Secondary | ICD-10-CM | POA: Diagnosis not present

## 2019-04-14 DIAGNOSIS — I5031 Acute diastolic (congestive) heart failure: Secondary | ICD-10-CM | POA: Diagnosis present

## 2019-04-14 DIAGNOSIS — M069 Rheumatoid arthritis, unspecified: Secondary | ICD-10-CM | POA: Diagnosis present

## 2019-04-14 DIAGNOSIS — N179 Acute kidney failure, unspecified: Secondary | ICD-10-CM | POA: Diagnosis present

## 2019-04-14 DIAGNOSIS — N184 Chronic kidney disease, stage 4 (severe): Secondary | ICD-10-CM | POA: Diagnosis present

## 2019-04-14 DIAGNOSIS — I10 Essential (primary) hypertension: Secondary | ICD-10-CM | POA: Diagnosis present

## 2019-04-14 DIAGNOSIS — R001 Bradycardia, unspecified: Secondary | ICD-10-CM | POA: Diagnosis present

## 2019-04-14 DIAGNOSIS — R4182 Altered mental status, unspecified: Secondary | ICD-10-CM

## 2019-04-14 DIAGNOSIS — I639 Cerebral infarction, unspecified: Secondary | ICD-10-CM | POA: Diagnosis present

## 2019-04-14 DIAGNOSIS — I480 Paroxysmal atrial fibrillation: Secondary | ICD-10-CM | POA: Diagnosis present

## 2019-04-14 DIAGNOSIS — I5033 Acute on chronic diastolic (congestive) heart failure: Secondary | ICD-10-CM

## 2019-04-14 DIAGNOSIS — I4891 Unspecified atrial fibrillation: Secondary | ICD-10-CM | POA: Diagnosis present

## 2019-04-14 DIAGNOSIS — I213 ST elevation (STEMI) myocardial infarction of unspecified site: Secondary | ICD-10-CM

## 2019-04-14 DIAGNOSIS — I739 Peripheral vascular disease, unspecified: Secondary | ICD-10-CM | POA: Diagnosis present

## 2019-04-14 DIAGNOSIS — Z90710 Acquired absence of both cervix and uterus: Secondary | ICD-10-CM

## 2019-04-14 DIAGNOSIS — Z7401 Bed confinement status: Secondary | ICD-10-CM | POA: Diagnosis not present

## 2019-04-14 DIAGNOSIS — R579 Shock, unspecified: Secondary | ICD-10-CM | POA: Diagnosis not present

## 2019-04-14 HISTORY — PX: TEMPORARY PACEMAKER: CATH118268

## 2019-04-14 LAB — RESPIRATORY PANEL BY RT PCR (FLU A&B, COVID)
Influenza A by PCR: NEGATIVE
Influenza B by PCR: NEGATIVE
SARS Coronavirus 2 by RT PCR: NEGATIVE

## 2019-04-14 LAB — BASIC METABOLIC PANEL
Anion gap: 16 — ABNORMAL HIGH (ref 5–15)
Anion gap: 12 (ref 5–15)
BUN: 58 mg/dL — ABNORMAL HIGH (ref 8–23)
BUN: 60 mg/dL — ABNORMAL HIGH (ref 8–23)
CO2: 16 mmol/L — ABNORMAL LOW (ref 22–32)
CO2: 19 mmol/L — ABNORMAL LOW (ref 22–32)
Calcium: 9.5 mg/dL (ref 8.9–10.3)
Calcium: 9.3 mg/dL (ref 8.9–10.3)
Chloride: 103 mmol/L (ref 98–111)
Chloride: 103 mmol/L (ref 98–111)
Creatinine, Ser: 2.62 mg/dL — ABNORMAL HIGH (ref 0.44–1.00)
Creatinine, Ser: 2.62 mg/dL — ABNORMAL HIGH (ref 0.44–1.00)
GFR calc Af Amer: 18 mL/min — ABNORMAL LOW (ref >60)
GFR calc Af Amer: 18 mL/min — ABNORMAL LOW (ref >60)
GFR calc non Af Amer: 15 mL/min — ABNORMAL LOW (ref >60)
GFR calc non Af Amer: 15 mL/min — ABNORMAL LOW (ref >60)
Glucose, Bld: 135 mg/dL — ABNORMAL HIGH (ref 70–99)
Glucose, Bld: 125 mg/dL — ABNORMAL HIGH (ref 70–99)
Potassium: 5.8 mmol/L — ABNORMAL HIGH (ref 3.5–5.1)
Potassium: 5.8 mmol/L — ABNORMAL HIGH (ref 3.5–5.1)
Sodium: 135 mmol/L (ref 135–145)
Sodium: 134 mmol/L — ABNORMAL LOW (ref 135–145)

## 2019-04-14 LAB — I-STAT CHEM 8, ED
BUN: 53 mg/dL — ABNORMAL HIGH (ref 8–23)
Calcium, Ion: 1.17 mmol/L (ref 1.15–1.40)
Chloride: 104 mmol/L (ref 98–111)
Creatinine, Ser: 2.6 mg/dL — ABNORMAL HIGH (ref 0.44–1.00)
Glucose, Bld: 132 mg/dL — ABNORMAL HIGH (ref 70–99)
HCT: 36 % (ref 36.0–46.0)
Hemoglobin: 12.2 g/dL (ref 12.0–15.0)
Potassium: 5.5 mmol/L — ABNORMAL HIGH (ref 3.5–5.1)
Sodium: 132 mmol/L — ABNORMAL LOW (ref 135–145)
TCO2: 18 mmol/L — ABNORMAL LOW (ref 22–32)

## 2019-04-14 LAB — CBC
HCT: 34.2 % — ABNORMAL LOW (ref 36.0–46.0)
HCT: 33.4 % — ABNORMAL LOW (ref 36.0–46.0)
Hemoglobin: 11.1 g/dL — ABNORMAL LOW (ref 12.0–15.0)
Hemoglobin: 10.6 g/dL — ABNORMAL LOW (ref 12.0–15.0)
MCH: 33.7 pg (ref 26.0–34.0)
MCH: 32.7 pg (ref 26.0–34.0)
MCHC: 32.5 g/dL (ref 30.0–36.0)
MCHC: 31.7 g/dL (ref 30.0–36.0)
MCV: 104 fL — ABNORMAL HIGH (ref 80.0–100.0)
MCV: 103.1 fL — ABNORMAL HIGH (ref 80.0–100.0)
Platelets: 182 10*3/uL (ref 150–400)
Platelets: 153 10*3/uL (ref 150–400)
RBC: 3.29 MIL/uL — ABNORMAL LOW (ref 3.87–5.11)
RBC: 3.24 MIL/uL — ABNORMAL LOW (ref 3.87–5.11)
RDW: 16.5 % — ABNORMAL HIGH (ref 11.5–15.5)
RDW: 16.4 % — ABNORMAL HIGH (ref 11.5–15.5)
WBC: 5.9 10*3/uL (ref 4.0–10.5)
WBC: 5.6 10*3/uL (ref 4.0–10.5)
nRBC: 0 % (ref 0.0–0.2)
nRBC: 0.5 % — ABNORMAL HIGH (ref 0.0–0.2)

## 2019-04-14 LAB — MRSA PCR SCREENING: MRSA by PCR: NEGATIVE

## 2019-04-14 LAB — POCT I-STAT 7, (LYTES, BLD GAS, ICA,H+H)
Acid-base deficit: 5 mmol/L — ABNORMAL HIGH (ref 0.0–2.0)
Bicarbonate: 21.1 mmol/L (ref 20.0–28.0)
Calcium, Ion: 1.24 mmol/L (ref 1.15–1.40)
HCT: 33 % — ABNORMAL LOW (ref 36.0–46.0)
Hemoglobin: 11.2 g/dL — ABNORMAL LOW (ref 12.0–15.0)
O2 Saturation: 89 %
Potassium: 5.8 mmol/L — ABNORMAL HIGH (ref 3.5–5.1)
Sodium: 132 mmol/L — ABNORMAL LOW (ref 135–145)
TCO2: 22 mmol/L (ref 22–32)
pCO2 arterial: 42.1 mmHg (ref 32.0–48.0)
pH, Arterial: 7.308 — ABNORMAL LOW (ref 7.350–7.450)
pO2, Arterial: 63 mmHg — ABNORMAL LOW (ref 83.0–108.0)

## 2019-04-14 LAB — TROPONIN I (HIGH SENSITIVITY)
Troponin I (High Sensitivity): 16 ng/L (ref <18)
Troponin I (High Sensitivity): 65 ng/L — ABNORMAL HIGH (ref <18)

## 2019-04-14 LAB — POC SARS CORONAVIRUS 2 AG -  ED: SARS Coronavirus 2 Ag: NEGATIVE

## 2019-04-14 LAB — TSH: TSH: 6.077 u[IU]/mL — ABNORMAL HIGH (ref 0.350–4.500)

## 2019-04-14 LAB — CBG MONITORING, ED: Glucose-Capillary: 128 mg/dL — ABNORMAL HIGH (ref 70–99)

## 2019-04-14 LAB — BRAIN NATRIURETIC PEPTIDE: B Natriuretic Peptide: 957 pg/mL — ABNORMAL HIGH (ref 0.0–100.0)

## 2019-04-14 SURGERY — TEMPORARY PACEMAKER
Anesthesia: LOCAL

## 2019-04-14 MED ORDER — DOPAMINE-DEXTROSE 3.2-5 MG/ML-% IV SOLN
5.0000 ug/kg/min | INTRAVENOUS | Status: DC
  Administered 2019-04-14: 5 ug/kg/min via INTRAVENOUS
  Filled 2019-04-14: qty 250

## 2019-04-14 MED ORDER — LEVOTHYROXINE SODIUM 75 MCG PO TABS: 75.0000 ug | ORAL_TABLET | Freq: Every day | ORAL | Status: DC

## 2019-04-14 MED ORDER — CHLORHEXIDINE GLUCONATE CLOTH 2 % EX PADS
6.0000 | MEDICATED_PAD | Freq: Every day | CUTANEOUS | Status: DC
  Administered 2019-04-14: 6 via TOPICAL

## 2019-04-14 MED ORDER — ATROPINE SULFATE 1 MG/ML IJ SOLN
INTRAMUSCULAR | Status: AC | PRN
  Administered 2019-04-14 (×2): 1 mg via INTRAVENOUS

## 2019-04-14 MED ORDER — ATROPINE SULFATE 1 MG/ML IJ SOLN
INTRAMUSCULAR | Status: AC | PRN
  Administered 2019-04-14: 1 mg via INTRAVENOUS

## 2019-04-14 MED ORDER — LIDOCAINE HCL (PF) 1 % IJ SOLN
INTRAMUSCULAR | Status: DC | PRN
  Administered 2019-04-14: 15 mL

## 2019-04-14 MED ORDER — LIDOCAINE HCL (PF) 1 % IJ SOLN
INTRAMUSCULAR | Status: AC
  Filled 2019-04-14: qty 30

## 2019-04-14 MED ORDER — HEPARIN (PORCINE) IN NACL 1000-0.9 UT/500ML-% IV SOLN
INTRAVENOUS | Status: DC | PRN
  Administered 2019-04-14: 500 mL

## 2019-04-14 MED ORDER — MORPHINE SULFATE (PF) 2 MG/ML IV SOLN
2.0000 mg | INTRAVENOUS | Status: DC | PRN
  Administered 2019-04-14 – 2019-04-15 (×4): 2 mg via INTRAVENOUS
  Filled 2019-04-14 (×4): qty 1

## 2019-04-14 MED ORDER — FUROSEMIDE 10 MG/ML IJ SOLN
40.0000 mg | Freq: Once | INTRAMUSCULAR | Status: AC
  Administered 2019-04-14: 40 mg via INTRAVENOUS
  Filled 2019-04-14: qty 4

## 2019-04-14 MED ORDER — HEPARIN (PORCINE) IN NACL 1000-0.9 UT/500ML-% IV SOLN
INTRAVENOUS | Status: AC
  Filled 2019-04-14: qty 500

## 2019-04-14 MED ORDER — SODIUM CHLORIDE 0.9 % IV SOLN: INTRAVENOUS | Status: DC

## 2019-04-14 MED ORDER — SODIUM CHLORIDE 0.9% FLUSH: 3.0000 mL | Freq: Once | INTRAVENOUS | Status: DC

## 2019-04-14 MED ORDER — HEPARIN SODIUM (PORCINE) 5000 UNIT/ML IJ SOLN
5000.0000 [IU] | Freq: Three times a day (TID) | INTRAMUSCULAR | Status: DC
  Administered 2019-04-14 – 2019-04-15 (×3): 5000 [IU] via SUBCUTANEOUS
  Filled 2019-04-14 (×3): qty 1

## 2019-04-14 SURGICAL SUPPLY — 12 items
CATH S G BIP PACING (CATHETERS) ×1 IMPLANT
KIT HEART LEFT (KITS) ×2 IMPLANT
PACK CARDIAC CATHETERIZATION (CUSTOM PROCEDURE TRAY) ×2 IMPLANT
PINNACLE LONG 6F 25CM (SHEATH) ×2
SHEATH BRITE TIP 6FR 35CM (SHEATH) ×1 IMPLANT
SHEATH INTRO PINNACLE 6F 25CM (SHEATH) IMPLANT
SHEATH PINNACLE 6F 10CM (SHEATH) IMPLANT
SHEATH PROBE COVER 6X72 (BAG) ×1 IMPLANT
SLEEVE REPOSITIONING LENGTH 30 (MISCELLANEOUS) ×1 IMPLANT
TRANSDUCER W/STOPCOCK (MISCELLANEOUS) ×2 IMPLANT
TUBING CIL FLEX 10 FLL-RA (TUBING) ×2 IMPLANT
WIRE EMERALD 3MM-J .035X150CM (WIRE) ×1 IMPLANT

## 2019-04-14 NOTE — ED Notes (Signed)
Dr. Tamala Julian at bedside. Pt being paced, MA turned up to 40 for capture

## 2019-04-14 NOTE — Progress Notes (Signed)
   Patient is more alert.  She is thrashing about in the bed.  She is not able to participate in understandable language.  Clinical status is deteriorating.  Blood pressure is 70/50 despite increased heart rate.  Pulmonary congestion is present on the chest x-ray, BNP is elevated, and urine output is nil since admission.  Potassium on repeat labs is 5.8 and creatinine 2.62 with a BUN of 60.  Overall condition given her age, prior strokes, frailty, acute kidney injury, and profound bradycardia is very poor with high likelihood of demise.  Conversation concerning goals of care with Madison Berg, one of her sons.  The patient has stated no desire to be placed on life support.  After discussing the pros, cons and limits of care, Madison Berg has given permission to make his mother DNR based on prior conversations with her and a clear understanding of her final wishes concerning life support.  I have made her DO NOT RESUSCITATE.  We will try low-dose dopamine to increase blood pressure and improve renal blood flow.  Hopefully this will establish a diuresis.  After dopamine is begun, if blood pressure increases we can consider giving an additional dose of Lasix later tonight.

## 2019-04-14 NOTE — Progress Notes (Signed)
Orthopedic Tech Progress Note Patient Details:  Madison Berg [birth year] [id number]  Ortho Devices Type of Ortho Device: Knee Immobilizer Ortho Device/Splint Location: right Ortho Device/Splint Interventions: Application   Post Interventions Patient Tolerated: Well Instructions Provided: Care of device   [place] Pink 04/06/2019, 1:09 PM

## 2019-04-14 NOTE — Plan of Care (Signed)
Patient ill appearing, more dyspnic, agitated since arriving in ICU. Goals of care discussed with family members. They are realistic about the fact that she may  not survive , given kidney failure, no urine output   Impaired gas exchange related to fliud,  increased oxygen demand Increase o2 to optimize o2 saturations Support family in decision making Monitor tranvenous site redress as needed Monitor urine output and lab values Supportive comfort care when family ready

## 2019-04-14 NOTE — Progress Notes (Signed)
CTSP at son's request.  Son is in the room with his mother.  He feels she is suffering.  She is on a non rebreather and gasping for breath. Her pupils are dilated and she appears agitated and uncomfortable.  He has discussed with his family and go this brother Leroy Sea on the phone who is HCPOA and they wish for patient to be made comfort care with no escalation of care.  They would like her to get morphine to make her comfortable. Nurse was present in the room for the conversation.  Will start with MSO4 2mg  q2-4hr PRN.

## 2019-04-14 NOTE — ED Provider Notes (Signed)
[facility] EMERGENCY DEPARTMENT Provider Note   CSN: [id number] Arrival date & time: 04/16/2019  [id number]     History Chief Complaint  Patient presents with  . Chest Pain    Madison Berg is a 85 y.o. female.  Pt presents to the ED today by car feeling like she was having a heart attack.  Pt looked like she was having labored breathing.  EKG showed HR of 20, so she was taken straight back to her room.  Pt unable to answer any questions and is minimally responsive when she arrived in her room.            Past Medical History:  Diagnosis Date  . Anxiety   . Aphasia as late effect of stroke 04/03/2014  . Carpal tunnel syndrome   . Cervical spondylosis   . Chronic kidney disease   . CVA (cerebral infarction) 04/2009   LMCA & posterior cerebral - excellent recovery, mild expressive aphasa  . Diverticulitis   . Gait disorder 04/03/2014  . History of nuclear stress test 06/2009   dipyridamole; negative, normal pattern of perfusion   . Hypertension   . Leg swelling   . Multiple lung nodules   . Nephrolithiasis   . OA (osteoarthritis)   . Osteopenia   . PAF (paroxysmal atrial fibrillation) (HCC)    a. not on anticoagulation due to history of chronic subdural hematomas  . Rheumatoid arthritis ([place])   . Valvular heart disease   . Vitamin D deficiency     Patient Active Problem List   Diagnosis Date Noted  . PVD (peripheral vascular disease) ([place]) 09/14/2017  . Edema of right lower extremity 03/10/2017  . Bradycardia 12/05/2016  . Left lower quadrant pain 12/05/2016  . Postural dizziness with presyncope 08/31/2016  . Paresthesia 05/12/2016  . Sinusitis, chronic 01/21/2016  . Chest pain, moderate coronary artery risk 06/24/2015  . Chest pain 12/14/2014  . Pain in joint, shoulder region 12/14/2014  . Acute-on-chronic kidney injury ([place]) 12/14/2014  . Head ache 04/03/2014  . Gait disorder 04/03/2014  . Aphasia as late effect of stroke 04/03/2014  .  Acute on chronic intracranial subdural hematoma (HCC) 03/16/2014  . Subdural hematoma ([place]) 03/16/2014  . Low back pain 09/17/2013  . Aortic valve regurgitation 01/31/2013  . Stroke ([place]) 01/31/2013  . Leg length discrepancy 11/14/2012  . Acquired right flat foot 11/14/2012  . Multiple lung nodules on CT 01/14/2010  . Cough 08/13/2009  . Essential hypertension 03/28/2009  . Atrial fibrillation ([place]) 03/28/2009  . HEMORRHOIDS 03/28/2009  . DIVERTICULAR DISEASE 03/28/2009  . ARTHRITIS 03/28/2009  . MIGRAINES, HX OF 03/28/2009    Past Surgical History:  Procedure Laterality Date  . ABDOMINAL HYSTERECTOMY  1975  . APPENDECTOMY    . CATARACT EXTRACTION Bilateral 2013  . Instestinal Surgery  1987   r/t blockage  . NOSE SURGERY  1985   r/t deviated septum  . REPLACEMENT TOTAL KNEE Right 2004  . TRANSTHORACIC ECHOCARDIOGRAM  09/19/2012   EF 55-60% with normal systolic function; mod AR; MVP with mild-mod regurg; LA severely dilated; RV systolic pressure increased; mod dilated RA; RV systolic pressure increased - mild pulm HTN      OB History   No obstetric history on file.     Family History  Problem Relation Age of Onset  . Lymphoma Mother   . Tuberculosis Father   . Tuberculosis Brother   . Aneurysm Other        grandmother, uncles,  cousins  . Lung disease Neg Hx     Social History   Tobacco Use  . Smoking status: Passive Smoke Exposure - Never Smoker  . Smokeless tobacco: Never Used  . Tobacco comment: Exposed at work, with her husband, & with her father.  Substance Use Topics  . Alcohol use: No    Alcohol/week: 0.0 standard drinks  . Drug use: No    Home Medications Prior to Admission medications   Medication Sig Start Date End Date Taking? Authorizing Provider  amiodarone (PACERONE) 200 MG tablet TAKE 1 TABLET BY MOUTH EVERY DAY 03/28/19   Kerin Ransom K, PA-C  amLODipine (NORVASC) 10 MG tablet Take 5 mg by mouth at bedtime. 5mg  at bedtime    [provider]  aspirin EC 81 MG tablet Take 81 mg by mouth daily.    [provider]  B Complex-C (B-COMPLEX WITH VITAMIN C) tablet Take 1 tablet by mouth daily.    [provider]  Cholecalciferol 1000 units tablet Take 1,000 Units by mouth daily.    [provider]  gabapentin (NEURONTIN) 100 MG capsule Take 1 capsule (100 mg total) by mouth 2 (two) times daily. 04/11/19   Ward Givens, NP  hydrALAZINE (APRESOLINE) 10 MG tablet Take 2 tablets (20 mg total) by mouth 2 (two) times daily. 01/23/19   Almyra Deforest, PA  Levothyroxine Sodium 75 MCG/ML SOLN Take 75 mcg by mouth daily before breakfast.     [provider]  Multiple Vitamins-Minerals (MULTIVITAMIN & MINERAL PO) Take 1 tablet by mouth daily.    [provider]  nitroGLYCERIN (NITROSTAT) 0.4 MG SL tablet Place 1 tablet (0.4 mg total) under the tongue every 5 (five) minutes as needed for chest pain. 07/02/15   Hilty, Nadean Corwin, MD  Omega-3 Fatty Acids (FISH OIL PO) Take 1 tablet by mouth at bedtime.    [provider]    Allergies    Amitriptyline hcl, Atenolol, Azithromycin, Cefuroxime, Cephalexin, Daypro [oxaprozin], Doxycycline, Famvir [famciclovir], Hyzaar [losartan potassium-hctz], Iodine, Metoprolol tartrate, Relafen [nabumetone], and Sterapred [prednisone]  Review of Systems   Review of Systems  Unable to perform ROS: Unstable vital signs  All other systems reviewed and are negative.   Physical Exam Updated Vital Signs BP (!) 133/57   Pulse (!) 35   Resp 17   SpO2 98%   Physical Exam Vitals and nursing note reviewed.  Constitutional:      General: She is in acute distress.  HENT:     Head: Normocephalic and atraumatic.  Eyes:     Extraocular Movements: Extraocular movements intact.     Pupils: Pupils are equal, round, and reactive to light.  Cardiovascular:     Rate and Rhythm: Bradycardia present.  Pulmonary:     Effort: Tachypnea present.  Abdominal:      General: Bowel sounds are normal.     Palpations: Abdomen is soft.  Musculoskeletal:        General: Normal range of motion.     Cervical back: Normal range of motion and neck supple.  Skin:    Capillary Refill: Capillary refill takes more than 3 seconds.  Neurological:     Mental Status: She is disoriented and confused.     ED Results / Procedures / Treatments   Labs (all labs ordered are listed, but only abnormal results are displayed) Labs Reviewed  BASIC METABOLIC PANEL - Abnormal; Notable for the following components:      Result Value   Potassium  5.8 (*)    CO2 16 (*)    Glucose, Bld 135 (*)    BUN 58 (*)    Creatinine, Ser 2.62 (*)    GFR calc non Af Amer 15 (*)    GFR calc Af Amer 18 (*)    Anion gap 16 (*)    All other components within normal limits  CBC - Abnormal; Notable for the following components:   RBC 3.29 (*)    Hemoglobin 11.1 (*)    HCT 34.2 (*)    MCV 104.0 (*)    RDW 16.5 (*)    All other components within normal limits  I-STAT CHEM 8, ED - Abnormal; Notable for the following components:   Sodium 132 (*)    Potassium 5.5 (*)    BUN 53 (*)    Creatinine, Ser 2.60 (*)    Glucose, Bld 132 (*)    TCO2 18 (*)    All other components within normal limits  CBG MONITORING, ED - Abnormal; Notable for the following components:   Glucose-Capillary 128 (*)    All other components within normal limits  POC SARS CORONAVIRUS 2 AG -  ED  TROPONIN I (HIGH SENSITIVITY)    EKG EKG Interpretation  Date/Time:  Saturday April 14 2019 08:32:39 EST Ventricular Rate:  47 PR Interval:    QRS Duration: 164 QT Interval:  576 QTC Calculation: 499 R Axis:   41 Text Interpretation: (no P-waves found) after 1 mg of atropine IV Confirmed by Isla Pence [phone]) on 04/13/2019 8:59:50 AM   Radiology No results found.  Procedures Procedures (including critical care time)  Medications Ordered in ED Medications  sodium chloride flush (NS) 0.9 % injection 3  mL (has no administration in time range)  0.9 %  sodium chloride infusion (has no administration in time range)  atropine injection (1 mg Intravenous Given 04/10/2019 0828)  atropine injection (1 mg Intravenous Given 04/28/2019 [id number])    ED Course  I have reviewed the triage vital signs and the nursing notes.  Pertinent labs & imaging results that were available during my care of the patient were reviewed by me and considered in my medical decision making (see chart for details).    MDM Rules/Calculators/A&P     CHA2DS2/VAS Stroke Risk Points  Current as of 2 minutes ago     6 >= 2 Points: High Risk  1 - 1.99 Points: Medium Risk  0 Points: Low Risk    This is the only CHA2DS2/VAS Stroke Risk Points available for the past  year.: Last Change: N/A     Details    This score determines the patient's risk of having a stroke if the  patient has atrial fibrillation.       Points Metrics  0 Has Congestive Heart Failure:  No    Current as of 2 minutes ago  0 Has Vascular Disease:  No    Current as of 2 minutes ago  1 Has Hypertension:  Yes    Current as of 2 minutes ago  2 Age:  57    Current as of 2 minutes ago  0 Has Diabetes:  No    Current as of 2 minutes ago  2 Had Stroke:  Yes  Had TIA:  No  Had thromboembolism:  No    Current as of 2 minutes ago  1 Female:  Yes    Current as of 2 minutes ago  Pt's HR was in the 20s and she was in complete heart block.  An IV was started and 1 mg of atropine was given.  HR did increase to the upper 30s and she became a little more responsive.  She was hooked to the TD pacer.  Pt d/w Dr. Tamala Julian (cards) who saw her right away.  He will take her for a temp pacemaker.  Pt d/w her son who said to do whatever needs to be done to keep pt alive.  CRITICAL CARE Performed by: Isla Pence   Total critical care time: 45  minutes  Critical care time was exclusive of separately billable procedures and treating other  patients.  Critical care was necessary to treat or prevent imminent or life-threatening deterioration.  Critical care was time spent personally by me on the following activities: development of treatment plan with patient and/or surrogate as well as nursing, discussions with consultants, evaluation of patient's response to treatment, examination of patient, obtaining history from patient or surrogate, ordering and performing treatments and interventions, ordering and review of laboratory studies, ordering and review of radiographic studies, pulse oximetry and re-evaluation of patient's condition.    Final Clinical Impression(s) / ED Diagnoses Final diagnoses:  Complete heart block (HCC)  CKD (chronic kidney disease) stage 4, GFR 15-29 ml/min [facility])    Rx / DC Orders ED Discharge Orders    None       Isla Pence, MD 25-Apr-2019 [phone]

## 2019-04-14 NOTE — Progress Notes (Signed)
Called for son- called his cell number no answer. Checked waiting room.  Will await him to approach unit. Cath lab and ED called no answer in cath lab. Patient admitted to ICU. Unable to vocalize , follows commands. Itching peri area, redness notied. Placed foley catheter with I=minimal output. Potassium on ABG 5.8

## 2019-04-14 NOTE — ED Triage Notes (Addendum)
Pt states, "I am having a heart attack please help me."  Not able to get her to answer questions regarding when pain started.  Pt with labored breathing.  EKG completed (HR 21) and pt straight to treatment room.

## 2019-04-14 NOTE — Progress Notes (Signed)
Chaplain responded to page for pt receiving pacemaker. Chaplain met son and MD in the hall outside of Madison Berg's room where they discussed her situation and need for immediate interventions. Annalynne has three sons. One of the three was present in the ED and working on communicating with the other sons. Chaplains remain available for support as needs arise.  Chaplain Resident, Evelene Croon, [place]. [phone]

## 2019-04-14 NOTE — H&P (Addendum)
The patient has been seen in conjunction with Daune Perch, NP-C. All aspects of care have been considered and discussed. The patient has been personally interviewed, examined, and all clinical data has been reviewed.   85 year old followed by Dr. Debara Pickett with history of PAF who according to the son has become progressively lethargic and nonambulatory over the past 48 to 72 hours.  Appetite is also been significantly impaired.  Complained of some shortness of breath but no chest pain.  On amiodarone therapy which is contributing to bradycardia.  Brought to the emergency room where she was found to have a heart rate of 20 bpm and was confused.  External pacing cause severe pain and was difficult for the patient to tolerate.  Altered mental status.  Unable to participate in general conversation.  Bilateral lower extremity 2+ edema.  EKG demonstrates narrow complex without ischemic change and heart rate of approximately 20 bpm.  No P waves are noted.  Chest x-ray demonstrates CHF, acute with pleural effusions as well.  Hyperkalemia at 5.8.  Bicarbonate is 16 representing lactic acidosis most likely.  Creatinine is 2.62.  Diagnoses: 1.  Severe bradycardia; 2.  Acute on chronic kidney disease stage IV; 3.  Hyperkalemia; 4.  Acute diastolic heart failure with pulmonary congestion secondary to bradycardia; 5.  Acute on chronic altered mental status.  PLAN:  Discussed temporary pacemaker implantation with the son who feels that everything should be done to keep her alive.  Discussed this would be a temporary measure and we would need to have further discussion about whether it is appropriate for permanent pacemaker therapy.  Discontinue amiodarone.  Check TSH.  Chest x-ray shows heart failure.  Will give 1 dose of Lasix to help clear pulmonary congestion and decrease potassium.  Overall prognosis is poor given the patient's age and the context of this particular clinical episode.  She is  still a full code.  We will need to have conversation concerning goals of care depending on how she does over the next 12 to 24 hours with pacing.  Critical care time 60 minutes.  Cardiology Admission History and Physical:   Patient ID: Madison Berg MRN: [id number]; DOB: February 01, 1924   Admission date: 04/27/2019  Primary Care Provider: Deland Pretty, MD Primary Cardiologist: Pixie Casino, MD  Primary Electrophysiologist:  None   Chief Complaint: Weakness  Patient Profile:   Madison Berg is a 85 y.o. female with paroxysmal atrial fibrillation, essential hypertension, aortic valve regurgitation, stroke, postural dizziness with syncope, PVD, acute on chronic intracranial subdural hematoma, CKD, gait disorder and bradycardia.    History of Present Illness:   Madison Berg is a 85 y.o. female with paroxysmal atrial fibrillation, essential hypertension, aortic valve regurgitation, stroke, postural dizziness with syncope, PVD, acute on chronic intracranial subdural hematoma, CKD, gait disorder and bradycardia.   The patient was seen by virtual visit in May with complaints of palpitations.  Her Plaquenil had been stopped and amiodarone increased.  Amiodarone was then again decreased back to 200 mg daily.  She is not on beta-blocker due to history of bradycardia.  She is not on anticoagulation due to intracranial subdural hematoma.  The patient presented to the ED today with profound weakness.  The patient is trying to speak however is unable to provide any meaningful information.  Her son arrives and helps provide information.  He says that she lives with his 2 brothers.  Usually she is independent with transferring to her bedside commode and can  walk around the house minimally with her walker.  He notes that she is prone to falling.  Over the last several days she has been getting progressively weaker.  On Wednesday she was seen by the neurologist with the only abnormal finding being  a low blood pressure of 88/58.  By Thursday she was much weaker, unable to stand or walk in her speaking was declining.  Over the last 24 hours she became severely weak, unable to even move around in the bed.  She has also had poor p.o. intake.   Heart Pathway Score:     Past Medical History:  Diagnosis Date  . Anxiety   . Aphasia as late effect of stroke 04/03/2014  . Carpal tunnel syndrome   . Cervical spondylosis   . Chronic kidney disease   . CVA (cerebral infarction) 04/2009   LMCA & posterior cerebral - excellent recovery, mild expressive aphasa  . Diverticulitis   . Gait disorder 04/03/2014  . History of nuclear stress test 06/2009   dipyridamole; negative, normal pattern of perfusion   . Hypertension   . Leg swelling   . Multiple lung nodules   . Nephrolithiasis   . OA (osteoarthritis)   . Osteopenia   . PAF (paroxysmal atrial fibrillation) (HCC)    a. not on anticoagulation due to history of chronic subdural hematomas  . Rheumatoid arthritis ([place])   . Valvular heart disease   . Vitamin D deficiency     Past Surgical History:  Procedure Laterality Date  . ABDOMINAL HYSTERECTOMY  1975  . APPENDECTOMY    . CATARACT EXTRACTION Bilateral 2013  . Instestinal Surgery  1987   r/t blockage  . NOSE SURGERY  1985   r/t deviated septum  . REPLACEMENT TOTAL KNEE Right 2004  . TRANSTHORACIC ECHOCARDIOGRAM  09/19/2012   EF 55-60% with normal systolic function; mod AR; MVP with mild-mod regurg; LA severely dilated; RV systolic pressure increased; mod dilated RA; RV systolic pressure increased - mild pulm HTN      Medications Prior to Admission: Prior to Admission medications   Medication Sig Start Date End Date Taking? Authorizing Provider  amiodarone (PACERONE) 200 MG tablet TAKE 1 TABLET BY MOUTH EVERY DAY 03/28/19   Kerin Ransom K, PA-C  amLODipine (NORVASC) 10 MG tablet Take 5 mg by mouth at bedtime. 5mg  at bedtime    [provider]  aspirin EC 81 MG tablet Take 81  mg by mouth daily.    [provider]  B Complex-C (B-COMPLEX WITH VITAMIN C) tablet Take 1 tablet by mouth daily.    [provider]  Cholecalciferol 1000 units tablet Take 1,000 Units by mouth daily.    [provider]  gabapentin (NEURONTIN) 100 MG capsule Take 1 capsule (100 mg total) by mouth 2 (two) times daily. 04/11/19   Ward Givens, NP  hydrALAZINE (APRESOLINE) 10 MG tablet Take 2 tablets (20 mg total) by mouth 2 (two) times daily. 01/23/19   Almyra Deforest, PA  Levothyroxine Sodium 75 MCG/ML SOLN Take 75 mcg by mouth daily before breakfast.     [provider]  Multiple Vitamins-Minerals (MULTIVITAMIN & MINERAL PO) Take 1 tablet by mouth daily.    [provider]  nitroGLYCERIN (NITROSTAT) 0.4 MG SL tablet Place 1 tablet (0.4 mg total) under the tongue every 5 (five) minutes as needed for chest pain. 07/02/15   Hilty, Nadean Corwin, MD  Omega-3 Fatty Acids (FISH OIL PO) Take 1 tablet by mouth at bedtime.  [provider]     Allergies:    Allergies  Allergen Reactions  . Amitriptyline Hcl Itching  . Atenolol Other (See Comments)    Hair loss, thin nails, itching  . Azithromycin Other (See Comments)    Mouth sores; sore throat  . Cefuroxime Other (See Comments)    unknown  . Cephalexin Nausea And Vomiting  . Daypro [Oxaprozin] Other (See Comments)    dyspepsia  . Doxycycline Other (See Comments)    Mouth sores; sore throat  . Famvir [Famciclovir] Other (See Comments)    unknown  . Hyzaar [Losartan Potassium-Hctz] Other (See Comments)    Headache; tight face  . Iodine Other (See Comments)    Involuntary muscle contractions  . Metoprolol Tartrate Itching  . Relafen [Nabumetone] Other (See Comments)    Mouth sores  . Sterapred [Prednisone] Other (See Comments)    weakness    Social History:   Social History   Socioeconomic History  . Marital status: Widowed    Spouse name: Not on file  . Number of children: 4  . Years  of education: Not on file  . Highest education level: Not on file  Occupational History    Comment: Retired  Tobacco Use  . Smoking status: Passive Smoke Exposure - Never Smoker  . Smokeless tobacco: Never Used  . Tobacco comment: Exposed at work, with her husband, & with her father.  Substance and Sexual Activity  . Alcohol use: No    Alcohol/week: 0.0 standard drinks  . Drug use: No  . Sexual activity: Not on file  Other Topics Concern  . Not on file  Social History Narrative   Patient is right handed.   No caffeine   Live at home with two sons   Education one year of college.   Retired.      [facility]:   Originally from [facility]. She has always lived in [place]. No international travel. She does have prior travel to [facility]. 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 They have no pets currently. No bird exposure. She does have one plant indoor currently. Does have carpet extensively throughout the home. No mold exposure.    Social Determinants of Health   Financial Resource Strain:   . Difficulty of Paying Living Expenses: Not on file  Food Insecurity:   . Worried About Charity fundraiser in the Last Year: Not on file  . Ran Out of Food in the Last Year: Not on file  Transportation Needs:   . Lack of Transportation (Medical): Not on file  . Lack of Transportation (Non-Medical): Not on file  Physical Activity:   . Days of Exercise per Week: Not on file  . Minutes of Exercise per Session: Not on file  Stress:   . Feeling of Stress : Not on file  Social Connections:   . Frequency of Communication with Friends and Family: Not on file  . Frequency of Social Gatherings with Friends and Family: Not on file  . Attends Religious Services: Not on file  . Active Member of Clubs or Organizations: Not on file  . Attends Archivist Meetings: Not on file  . Marital Status: Not on file  Intimate Partner Violence:   . Fear of Current or Ex-Partner: Not on file  .  Emotionally Abused: Not on file  . Physically Abused: Not on file  . Sexually Abused: Not on file    Family History:   The patient's family history  includes Aneurysm in an other family member; Lymphoma in her mother; Tuberculosis in her brother and father. There is no history of Lung disease.    ROS:  Please see the history of present illness.  All other ROS reviewed and negative.     Physical Exam/Data:   Vitals:   04/21/2019 0840 04/30/2019 0841 04/06/2019 0855 04/13/2019 0900  BP:   100/75 (!) 133/57  Pulse: (!) 38 (!) 37 (!) 35 (!) 35  Resp: 15 11 (!) 22 17  SpO2: 100% 99% 98% 98%   No intake or output data in the 24 hours ending 04/13/2019 0912 Last 3 Weights 04/11/2019 01/29/2019 09/05/2018  Weight (lbs) 122 lb 3.2 oz 113 lb 117 lb  Weight (kg) 55.43 kg 51.256 kg 53.071 kg     There is no height or weight on file to calculate BMI.  General:  Acute ill appearing, frail elderly female HEENT: normal Lymph: no adenopathy Neck: no JVD Endocrine:  No thryomegaly Vascular: palpable slow radial pulse, brisk cap refill Cardiac:  bradycardic Lungs:  clear to auscultation bilaterally, no wheezing, rhonchi or rales  Abd: soft, nontender, no hepatomegaly  Ext: no edema Musculoskeletal:  No deformities, BUE and BLE strength normal and equal Skin: warm and dry  Neuro:  CNs 2-12 intact, no focal abnormalities noted Psych:  Normal affect    EKG:  The ECG that was done was personally reviewed and demonstrates CHB vs very slow afib, no p waves  Relevant CV Studies:  Echocardiogram 12/15/2014 Study Conclusions  - Left ventricle: The cavity size was normal. Wall thickness was   normal. Systolic function was normal. The estimated ejection   fraction was in the range of 55% to 60%. Wall motion was normal;   there were no regional wall motion abnormalities. - Aortic valve: There was trivial regurgitation. - Mitral valve: There was mild regurgitation. - Left atrium: The atrium was severely  dilated. - Right atrium: The atrium was moderately to severely dilated. - Tricuspid valve: There was mild-moderate regurgitation. - Pulmonary arteries: Systolic pressure was mildly increased. PA   peak pressure: 37 mm Hg (S).  Laboratory Data:  High Sensitivity Troponin:  No results for input(s): TROPONINIHS in the last 720 hours.    Chemistry Recent Labs  Lab 04/12/2019 0836  NA 132*  K 5.5*  CL 104  GLUCOSE 132*  BUN 53*  CREATININE 2.60*    No results for input(s): PROT, ALBUMIN, AST, ALT, ALKPHOS, BILITOT in the last 168 hours. Hematology Recent Labs  Lab 04/18/2019 0830 05/03/2019 0836  WBC 5.9  --   RBC 3.29*  --   HGB 11.1* 12.2  HCT 34.2* 36.0  MCV 104.0*  --   MCH 33.7  --   MCHC 32.5  --   RDW 16.5*  --   PLT 182  --    BNPNo results for input(s): BNP, PROBNP in the last 168 hours.  DDimer No results for input(s): DDIMER in the last 168 hours.   Radiology/Studies:  No results found.  Assessment and Plan:   Severe bradycardia -Patient has been on amiodarone for chronic atrial fibrillation, last dose was yesterday.  Patient has become profoundly weak over the last 3 days.  Found to have heart rate in the 20s.  -Patient also has acute renal injury and hyperkalemia, possibly related to hypoperfusion. -Patient was given atropine in the ED without response.  Attempted to externally pace the patient, however she was unable to tolerate this. -We will stop amiodarone. -  Plan to insert temp wire pacing.  She may possibly need permanent pacemaker.  However with the patient's age, level of functioning and comorbidities, we would need to address goals of care.  The patient lives with her 2 sons with another son being involved in care.  They will need to come together and have a discussion.  We will see how the patient responds cognitively on temporary pacing. -We will check an echocardiogram for LV function.  LV function was normal by last echo in 2016. -Will check  TSH. -Patient is not on anticoagulation due to history of subdural hematoma. -We will keep patient n.p.o. due to decreased level of consciousness.  Hyperkalemia -In the setting of severe bradycardia and acute kidney injury.  Will assess an ABG for lactic acidosis.  She may need calcium gluconate.  Acute CHF -Portable chest x-ray done in the ED shows acute CHF with new cardiomegaly and bilateral pleural effusions.  Possibly related to reduced cardiac output in the setting of severe bradycardia. -We will reassess fluid status once her temp wire is placed.  Acute kidney injury -Serum creatinine up to 2.6 with a potassium of 5.8, 5.5. -We will check ABGs for acidosis. -Patient's son reports poor p.o. intake over the past at least 3 days related to her weakness. -Possibly hypoperfusion to the kidneys in the setting of severe bradycardia, poor p.o. intake. -We will provide gentle hydration.  Essential hypertension -Home medications include hydralazine and amlodipine.  Will hold for now with acute illness.  Severity of Illness: The appropriate patient status for this patient is INPATIENT. Inpatient status is judged to be reasonable and necessary in order to provide the required intensity of service to ensure the patient's safety. The patient's presenting symptoms, physical exam findings, and initial radiographic and laboratory data in the context of their chronic comorbidities is felt to place them at high risk for further clinical deterioration. Furthermore, it is not anticipated that the patient will be medically stable for discharge from the hospital within 2 midnights of admission. The following factors support the patient status of inpatient.   " The patient's presenting symptoms include severe weakness, altered LOC. " The worrisome physical exam findings include severe bradycardia. " The initial radiographic and laboratory data are worrisome because of CHF on CXR, HR in the 20's on EKG and  tele. " The chronic co-morbidities include advanced age, atrial fibrillation, history of subdural hematoma, hypertension.   * I certify that at the point of admission it is my clinical judgment that the patient will require inpatient hospital care spanning beyond 2 midnights from the point of admission due to high intensity of service, high risk for further deterioration and high frequency of surveillance required.*    For questions or updates, please contact [facility] Please consult [email or web address] for contact info under        Signed, Daune Perch, NP  05/01/2019 9:12 AM

## 2019-04-14 NOTE — Progress Notes (Signed)
Patient is increasing in agitation. Dr Tamala Julian aware of this as well as diaphoresis and increased oxygen demand, pupils that are dilated and non responsive. Family stated this has progressed over the last couple of days. Dopamine began at 2.5 mcg/kg./min.  Attempted to position patient to make more comfortable, no position seems to make a difference. Right leg with immobilizer, mitts on due to patient pulling at medical devices, can get them off, at times. Family in with patient listening to radio.

## 2019-04-15 ENCOUNTER — Inpatient Hospital Stay (HOSPITAL_COMMUNITY): Payer: Medicare Other

## 2019-04-15 DIAGNOSIS — R579 Shock, unspecified: Secondary | ICD-10-CM

## 2019-04-15 DIAGNOSIS — I442 Atrioventricular block, complete: Secondary | ICD-10-CM

## 2019-04-15 DIAGNOSIS — R34 Anuria and oliguria: Secondary | ICD-10-CM

## 2019-04-15 DIAGNOSIS — E875 Hyperkalemia: Secondary | ICD-10-CM

## 2019-04-15 LAB — BASIC METABOLIC PANEL
Anion gap: 10 (ref 5–15)
BUN: 64 mg/dL — ABNORMAL HIGH (ref 8–23)
CO2: 22 mmol/L (ref 22–32)
Calcium: 9.3 mg/dL (ref 8.9–10.3)
Chloride: 104 mmol/L (ref 98–111)
Creatinine, Ser: 2.89 mg/dL — ABNORMAL HIGH (ref 0.44–1.00)
GFR calc Af Amer: 16 mL/min — ABNORMAL LOW (ref >60)
GFR calc non Af Amer: 13 mL/min — ABNORMAL LOW (ref >60)
Glucose, Bld: 106 mg/dL — ABNORMAL HIGH (ref 70–99)
Potassium: 6.5 mmol/L (ref 3.5–5.1)
Sodium: 136 mmol/L (ref 135–145)

## 2019-04-15 MED ORDER — MORPHINE SULFATE (PF) 2 MG/ML IV SOLN: 2.0000 mg | INTRAVENOUS | Status: DC | PRN

## 2019-04-15 MED FILL — Medication: Qty: 1 | Status: AC

## 2019-04-16 ENCOUNTER — Encounter: Payer: Self-pay | Admitting: Cardiology

## 2019-04-19 ENCOUNTER — Telehealth: Payer: Self-pay | Admitting: Internal Medicine

## 2019-04-19 NOTE — Telephone Encounter (Signed)
Called [employer] back. Advised them to call [facility] medical records because we do not do death certificates.

## 2019-04-19 NOTE — Telephone Encounter (Signed)
[employer] consultant from [employer] in [place] calling requesting a death certificated for the patient.

## 2019-04-25 ENCOUNTER — Telehealth: Payer: Self-pay | Admitting: Interventional Cardiology

## 2019-04-25 NOTE — Telephone Encounter (Signed)
Signed death certificate faxed to [facility]. Death certificate is not acceptable to file with [facility] due to corrections. Will mail a new death certificate to be signed.

## 2019-05-01 ENCOUNTER — Ambulatory Visit: Payer: Medicare Other | Admitting: Internal Medicine

## 2019-05-03 ENCOUNTER — Telehealth: Payer: Self-pay | Admitting: Interventional Cardiology

## 2019-05-03 NOTE — Telephone Encounter (Signed)
New death certificate received from [facility]. Placed in Dr. Thompson Caul box for signature. 05/03/19 vlm

## 2019-05-04 NOTE — Progress Notes (Addendum)
Patient had cessation of breath and asystole on monitor. Listened apically for 1 minute without heartbeat heard. Second by Laverta Baltimore RN Son in attendance and aware of death. Denied need for chaplain. [employer] paged to make them aware.

## 2019-05-04 NOTE — Progress Notes (Signed)
[id number] Full release  From [place] donor service due to age.  Spoke to [place].

## 2019-05-04 NOTE — Progress Notes (Signed)
Progress Note  Patient Name: Madison Berg Date of Encounter: 2019/04/29  Primary Cardiologist: Pixie Casino, MD   Subjective   Son is at bedside.  There was discussion yesterday concerning goals of care.  Ultimately made DNR and further request from family for comfort care only without escalation.  She has not made any urine since admission.  Low-dose dopamine has not helped.  She remains in shock.  Inpatient Medications    Scheduled Meds: . Chlorhexidine Gluconate Cloth  6 each Topical Daily  . heparin  5,000 Units Subcutaneous Q8H  . levothyroxine  75 mcg Oral Q0600  . sodium chloride flush  3 mL Intravenous Once   Continuous Infusions: . DOPamine 2.5 mcg/kg/min (04-29-2019 0700)   PRN Meds: morphine injection   Vital Signs    Vitals:   04/29/2019 0500 2019-04-29 0600 Apr 29, 2019 0700 29-Apr-2019 0800  BP: 93/63 (!) 76/61 104/77 (!) 79/52  Pulse: 79 80 77 80  Resp: 18 (!) 21 (!) 23 15  SpO2: 99% 96% (!) 85% 97%  Weight:        Intake/Output Summary (Last 24 hours) at 2019-04-29 [id number] Last data filed at 04/29/19 0700 Gross per 24 hour  Intake 34.02 ml  Output 10 ml  Net 24.02 ml   Last 3 Weights 04/29/2019 04/11/2019 01/29/2019  Weight (lbs) 126 lb 5.2 oz 122 lb 3.2 oz 113 lb  Weight (kg) 57.3 kg 55.43 kg 51.256 kg      Telemetry    Pacing noted- Personally Reviewed  ECG    No EKG today- Personally Reviewed  Physical Exam  Frail GEN: Some respiratory distress Neck: No JVD Cardiac: RRR, no murmurs, rubs, or gallops.  Respiratory:  Rhonchi and wheezing. GI: Soft, nontender, non-distended  MS: No edema; No deformity. Neuro:  Nonfocal  Psych: Normal affect   Labs    High Sensitivity Troponin:   Recent Labs  Lab 04/22/2019 0830 04/08/2019 1407  TROPONINIHS 16 65*      Chemistry Recent Labs  Lab 04/20/2019 0830 04/20/2019 0836 04/20/2019 1128 04/26/2019 1407 2019-04-29 0501  NA 135 132* 132* 134* 136  K 5.8* 5.5* 5.8* 5.8* 6.5*  CL 103 104  --  103  104  CO2 16*  --   --  19* 22  GLUCOSE 135* 132*  --  125* 106*  BUN 58* 53*  --  60* 64*  CREATININE 2.62* 2.60*  --  2.62* 2.89*  CALCIUM 9.5  --   --  9.3 9.3  GFRNONAA 15*  --   --  15* 13*  GFRAA 18*  --   --  18* 16*  ANIONGAP 16*  --   --  12 10     Hematology Recent Labs  Lab 04/22/2019 0830 04/27/2019 0836 04/05/2019 1128 04/04/2019 1407  WBC 5.9  --   --  5.6  RBC 3.29*  --   --  3.24*  HGB 11.1* 12.2 11.2* 10.6*  HCT 34.2* 36.0 33.0* 33.4*  MCV 104.0*  --   --  103.1*  MCH 33.7  --   --  32.7  MCHC 32.5  --   --  31.7  RDW 16.5*  --   --  16.4*  PLT 182  --   --  153    BNP Recent Labs  Lab 04/17/2019 1407  BNP 957.0*     DDimer No results for input(s): DDIMER in the last 168 hours.   Radiology    CARDIAC CATHETERIZATION  Result Date:  05/02/2019 Successful fluoroscopic guided transvenous pacemaker placement  DG Chest Portable 1 View  Result Date: 04/22/2019 CLINICAL DATA:  86 year old female with chest pain EXAM: PORTABLE CHEST 1 VIEW COMPARISON:  07/18/2017 FINDINGS: Cardiomediastinal silhouette enlarged compared to the prior. Defibrillator pads project over the left chest. Hazy opacities at the lung bases with blunting of the left costophrenic angle and right costophrenic angle. Interlobular septal thickening. No pneumothorax. Fullness in the central vasculature. IMPRESSION: Acute CHF with new cardiomegaly and bilateral pleural effusions. Defibrillator pads. Electronically Signed   By: Corrie Mckusick D.O.   On: 04/26/2019 09:23    Cardiac Studies   Echocardiogram was ordered but will be canceled.  Patient Profile     86 y.o. female with paroxysmal atrial fibrillation, essential hypertension, aortic valve regurgitation, stroke, postural dizziness with syncope, PVD, acute on chronic intracranial subdural hematoma, CKD, gait disorder and presenting with profound bradycardia with heart rate of 20 bpm and acute kidney failure.  Assessment & Plan     1. Profound bradycardia with several days of low flow.  Pacemaker dependent and complicated by therapy with amiodarone. 2. Resistant cardiogenic shock secondary to systemic inflammatory response syndrome with multiorgan failure 3. Acute kidney failure with severe elevation in potassium and and urea.  Discussed with family and there is no desire to escalate to dialysis.  Plan to discontinue dopamine. 4. End of life: Plan to decrease pacemaker rate.  Discontinue dopamine.  Comfort care with opiate therapy.  Family is in accord with comfort measures.  Patient is already DNR.  For questions or updates, please contact [facility] Please consult [email or web address] for contact info under        Signed, Sinclair Grooms, MD  05/06/19, 9:18 AM

## 2019-05-04 NOTE — Death Summary Note (Addendum)
Discharge Summary    Patient ID: Madison Berg MRN: [id number]; DOB: 1923/06/30  Admit date: 04/06/2019 Discharge date: 2019-05-08  Primary Care Provider: Deland Pretty, MD  Primary Cardiologist: Pixie Casino, MD  Primary Electrophysiologist:  None   Discharge Diagnoses    Principal Problem:   Bradycardia Active Problems:   Essential hypertension   Atrial fibrillation [facility])   Aortic valve regurgitation   Stroke [facility])   Acute on chronic intracranial subdural hematoma (HCC)   Acute kidney failure ([place])   PVD (peripheral vascular disease) ([place])   Shock ([place])   Hyperkalemia   Anuria    Diagnostic Studies/Procedures    Temporary transvenous pacemaker insertion 04/08/2019 _____________   History of Present Illness     Madison Berg is a 86 y.o. female with multiple prior medical problems including stroke, PVD, atrial for ablation, chronic therapy with amiodarone, hypertension, who presented with increasing lethargy having become bedridden over the 3 to 4 days prior to admission.  Heart rates were noted by the family to be in the 69s.  She lost ability to speak.  Hospital Course     Consultants: Invasive Cath Lab team for temporary pacemaker insertion  After temporary pacemaker insertion the patient did not significantly improve.  She developed anuria with a rising creatinine.  On 2019-05-08 the creatinine had risen to 2.9.  Potassium had increased from 5.8-->6.5.   On 05/02/2019, conversation with family power of attorney, son Leroy Sea voiced the patient's desire to avoid heroic measures including artificial life support.  DO NOT RESUSCITATE orders were placed.  Later in the evening, family desired conservative comfort care.  Further escalation of therapy was felt to be futile.  IV dopamine was discontinued.  Intermittent morphine was ordered for comfort.  At 9:44 AM, the patient was pronounced by Alma Friendly at 9:44 AM.  The members were at bedside when the  patient transitioned to death. _____________  Discharge Vitals Blood pressure (!) 79/52, pulse 80, resp. rate 15, height 5\' 3"  (1.6 m), weight 57 kg, SpO2 97 %.  Filed Weights   04/05/2019 1032 2019/05/08 1000  Weight: 57.3 kg 57 kg    Labs & Radiologic Studies    CBC Recent Labs    04/17/2019 0830 04/21/2019 1128 04/07/2019 1407  WBC 5.9  --  5.6  HGB 11.1* 11.2* 10.6*  HCT 34.2* 33.0* 33.4*  MCV 104.0*  --  103.1*  PLT 182  --  0000000   Basic Metabolic Panel Recent Labs    05/02/2019 1407 2019/05/08 0501  NA 134* 136  K 5.8* 6.5*  CL 103 104  CO2 19* 22  GLUCOSE 125* 106*  BUN 60* 64*  CREATININE 2.62* 2.89*  CALCIUM 9.3 9.3   Liver Function Tests No results for input(s): AST, ALT, ALKPHOS, BILITOT, PROT, ALBUMIN in the last 72 hours. No results for input(s): LIPASE, AMYLASE in the last 72 hours. High Sensitivity Troponin:   Recent Labs  Lab 04/22/2019 0830 04/22/2019 1407  TROPONINIHS 16 65*    BNP Invalid input(s): POCBNP D-Dimer No results for input(s): DDIMER in the last 72 hours. Hemoglobin A1C No results for input(s): HGBA1C in the last 72 hours. Fasting Lipid Panel No results for input(s): CHOL, HDL, LDLCALC, TRIG, CHOLHDL, LDLDIRECT in the last 72 hours. Thyroid Function Tests Recent Labs    04/05/2019 1407  TSH 6.077*   _____________  CARDIAC CATHETERIZATION  Result Date: 04/13/2019 Successful fluoroscopic guided transvenous pacemaker placement  DG Chest Portable 1 View  Result Date:  04/04/2019 CLINICAL DATA:  86 year old female with chest pain EXAM: PORTABLE CHEST 1 VIEW COMPARISON:  07/18/2017 FINDINGS: Cardiomediastinal silhouette enlarged compared to the prior. Defibrillator pads project over the left chest. Hazy opacities at the lung bases with blunting of the left costophrenic angle and right costophrenic angle. Interlobular septal thickening. No pneumothorax. Fullness in the central vasculature. IMPRESSION: Acute CHF with new cardiomegaly and  bilateral pleural effusions. Defibrillator pads. Electronically Signed   By: Corrie Mckusick D.O.   On: 05/02/2019 09:23   Disposition   Released to the mortuary service of choice.    Duration of Discharge Encounter:31 minutes  Greater than 30 minutes including physician time.  Signed, Sinclair Grooms, MD 04/20/19, 11:16 AM

## 2019-05-04 NOTE — Progress Notes (Signed)
Son at bedside, pateint unresponsive, Heart rate trending down as well as respirations and blood pressure, called for other sons to come in. Old hyms playing per family request.  Patient not moving at present. Comfort cart ordered

## 2019-05-04 NOTE — Progress Notes (Signed)
All lines and tubes out, patient placement aware of patient, transported down to morgue by [employer]

## 2019-05-04 DEATH — deceased

## 2019-05-08 ENCOUNTER — Telehealth: Payer: Self-pay | Admitting: Interventional Cardiology

## 2019-05-08 NOTE — Telephone Encounter (Signed)
New death certificate signed by Dr. Tamala Julian. [employer] [facility] contacted for pick up. 05/08/19 vlm

## 2019-10-11 ENCOUNTER — Ambulatory Visit: Payer: Medicare Other | Admitting: Adult Health
